# Patient Record
Sex: Male | Born: 1980 | Race: White | Hispanic: No | Marital: Married | Smoking: Former smoker
Health system: Southern US, Community
[De-identification: ages and names within clinical notes are randomized; demographics above are authoritative.]

## PROBLEM LIST (undated history)

## (undated) DIAGNOSIS — E7800 Pure hypercholesterolemia, unspecified: Secondary | ICD-10-CM

## (undated) DIAGNOSIS — G4730 Sleep apnea, unspecified: Secondary | ICD-10-CM

## (undated) DIAGNOSIS — E119 Type 2 diabetes mellitus without complications: Secondary | ICD-10-CM

## (undated) DIAGNOSIS — F329 Major depressive disorder, single episode, unspecified: Secondary | ICD-10-CM

## (undated) DIAGNOSIS — Z720 Tobacco use: Secondary | ICD-10-CM

## (undated) DIAGNOSIS — F32A Depression, unspecified: Secondary | ICD-10-CM

## (undated) DIAGNOSIS — G459 Transient cerebral ischemic attack, unspecified: Secondary | ICD-10-CM

## (undated) DIAGNOSIS — I10 Essential (primary) hypertension: Secondary | ICD-10-CM

## (undated) HISTORY — PX: ABDOMINAL SURGERY: SHX537

## (undated) HISTORY — DX: Sleep apnea, unspecified: G47.30

## (undated) HISTORY — DX: Transient cerebral ischemic attack, unspecified: G45.9

## (undated) HISTORY — PX: CHOLECYSTECTOMY: SHX55

## (undated) HISTORY — PX: APPENDECTOMY: SHX54

---
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
## 2004-05-14 ENCOUNTER — Inpatient Hospital Stay (HOSPITAL_COMMUNITY): Admission: EM | Admit: 2004-05-14 | Discharge: 2004-05-16 | Payer: Self-pay | Admitting: Emergency Medicine

## 2005-07-31 ENCOUNTER — Emergency Department (HOSPITAL_COMMUNITY): Admission: EM | Admit: 2005-07-31 | Discharge: 2005-07-31 | Payer: Self-pay | Admitting: Emergency Medicine

## 2005-10-26 ENCOUNTER — Observation Stay (HOSPITAL_COMMUNITY): Admission: EM | Admit: 2005-10-26 | Discharge: 2005-10-26 | Payer: Self-pay | Admitting: Emergency Medicine

## 2005-10-26 ENCOUNTER — Encounter (INDEPENDENT_AMBULATORY_CARE_PROVIDER_SITE_OTHER): Payer: Self-pay | Admitting: Specialist

## 2006-04-01 ENCOUNTER — Emergency Department (HOSPITAL_COMMUNITY): Admission: EM | Admit: 2006-04-01 | Discharge: 2006-04-01 | Payer: Self-pay | Admitting: Emergency Medicine

## 2007-02-17 ENCOUNTER — Emergency Department (HOSPITAL_COMMUNITY): Admission: EM | Admit: 2007-02-17 | Discharge: 2007-02-17 | Payer: Self-pay | Admitting: Family Medicine

## 2007-06-02 ENCOUNTER — Ambulatory Visit (HOSPITAL_COMMUNITY): Admission: RE | Admit: 2007-06-02 | Discharge: 2007-06-02 | Payer: Self-pay | Admitting: Family Medicine

## 2007-11-28 ENCOUNTER — Ambulatory Visit (HOSPITAL_COMMUNITY): Admission: RE | Admit: 2007-11-28 | Discharge: 2007-11-28 | Payer: Self-pay | Admitting: Family Medicine

## 2008-04-17 ENCOUNTER — Emergency Department (HOSPITAL_COMMUNITY): Admission: EM | Admit: 2008-04-17 | Discharge: 2008-04-17 | Payer: Self-pay | Admitting: Emergency Medicine

## 2008-04-18 ENCOUNTER — Emergency Department (HOSPITAL_COMMUNITY): Admission: EM | Admit: 2008-04-18 | Discharge: 2008-04-18 | Payer: Self-pay | Admitting: Emergency Medicine

## 2008-04-19 ENCOUNTER — Inpatient Hospital Stay (HOSPITAL_COMMUNITY): Admission: AD | Admit: 2008-04-19 | Discharge: 2008-04-21 | Payer: Self-pay | Admitting: Family Medicine

## 2008-04-19 ENCOUNTER — Encounter (HOSPITAL_COMMUNITY): Admission: RE | Admit: 2008-04-19 | Discharge: 2008-05-19 | Payer: Self-pay | Admitting: Emergency Medicine

## 2008-04-20 ENCOUNTER — Encounter (INDEPENDENT_AMBULATORY_CARE_PROVIDER_SITE_OTHER): Payer: Self-pay | Admitting: General Surgery

## 2008-04-23 ENCOUNTER — Inpatient Hospital Stay (HOSPITAL_COMMUNITY): Admission: EM | Admit: 2008-04-23 | Discharge: 2008-04-28 | Payer: Self-pay | Admitting: Emergency Medicine

## 2008-04-30 ENCOUNTER — Inpatient Hospital Stay (HOSPITAL_COMMUNITY): Admission: EM | Admit: 2008-04-30 | Discharge: 2008-05-12 | Payer: Self-pay | Admitting: Emergency Medicine

## 2008-05-31 ENCOUNTER — Encounter: Admission: RE | Admit: 2008-05-31 | Discharge: 2008-05-31 | Payer: Self-pay | Admitting: Gastroenterology

## 2008-06-02 ENCOUNTER — Ambulatory Visit (HOSPITAL_COMMUNITY): Admission: RE | Admit: 2008-06-02 | Discharge: 2008-06-02 | Payer: Self-pay | Admitting: Gastroenterology

## 2008-06-04 ENCOUNTER — Encounter: Admission: RE | Admit: 2008-06-04 | Discharge: 2008-06-04 | Payer: Self-pay | Admitting: Gastroenterology

## 2008-06-07 ENCOUNTER — Ambulatory Visit (HOSPITAL_COMMUNITY): Admission: RE | Admit: 2008-06-07 | Discharge: 2008-06-07 | Payer: Self-pay | Admitting: Gastroenterology

## 2010-01-13 IMAGING — RF DG ERCP WO/W SPHINCTEROTOMY
1 series · 7 of 7 positions shown · non-contrast
Comparison: CT scan dated 04/30/2008

CLINICAL DATA: Abdominal pain after cholecystectomy.  Bile leak.

ERCP
Fluoroscopy Time: 7 minutes 36 seconds

[Series 1: run · 7 of 7 slices shown]
[im 1/7]
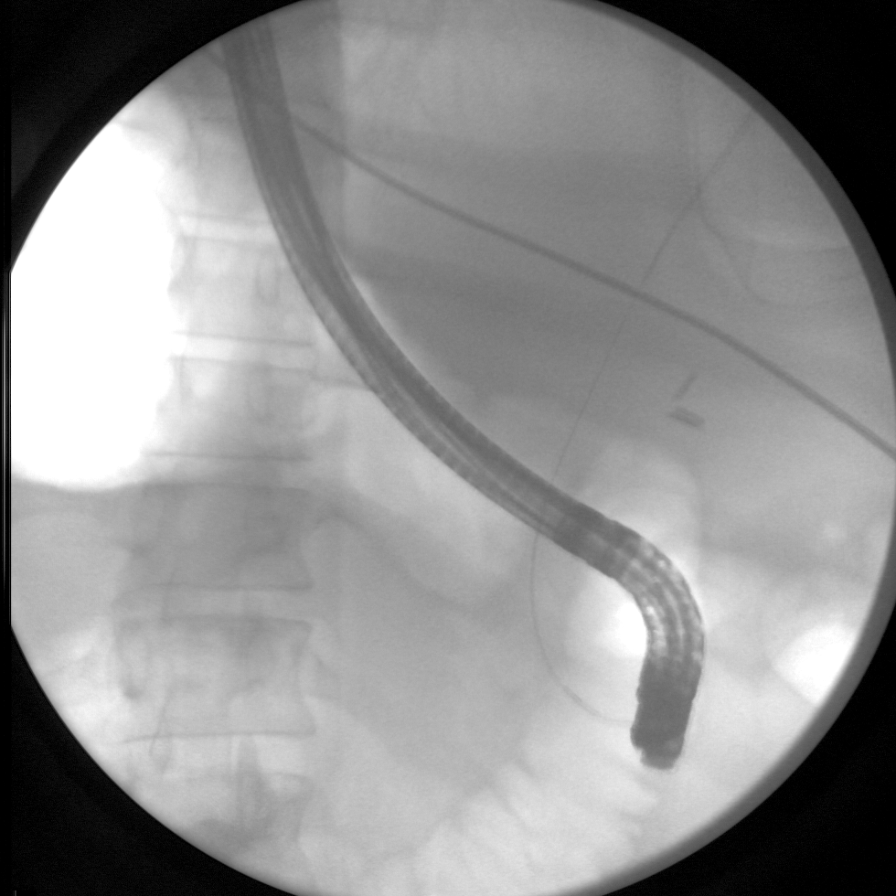
[im 2/7]
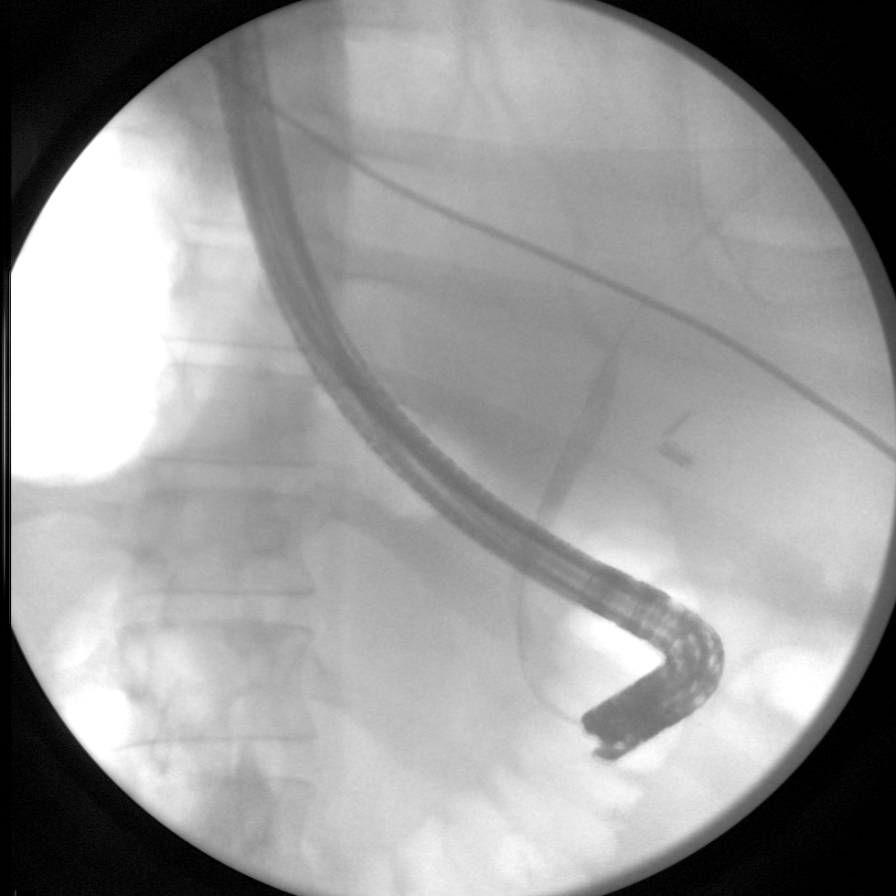
[im 3/7]
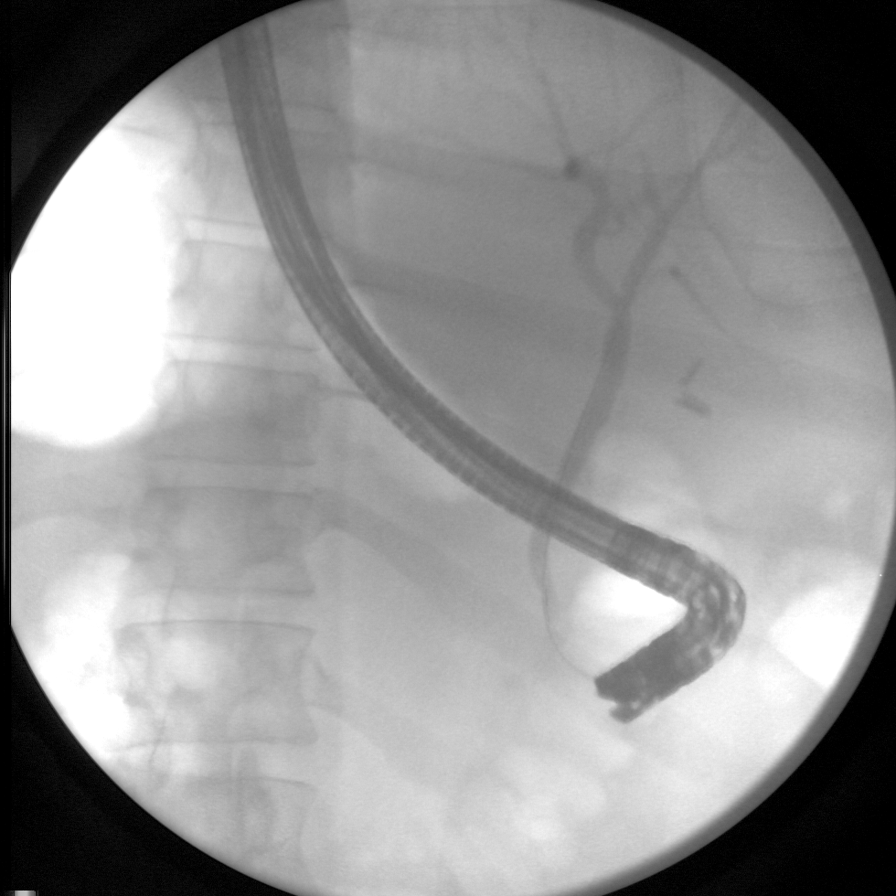
[im 4/7]
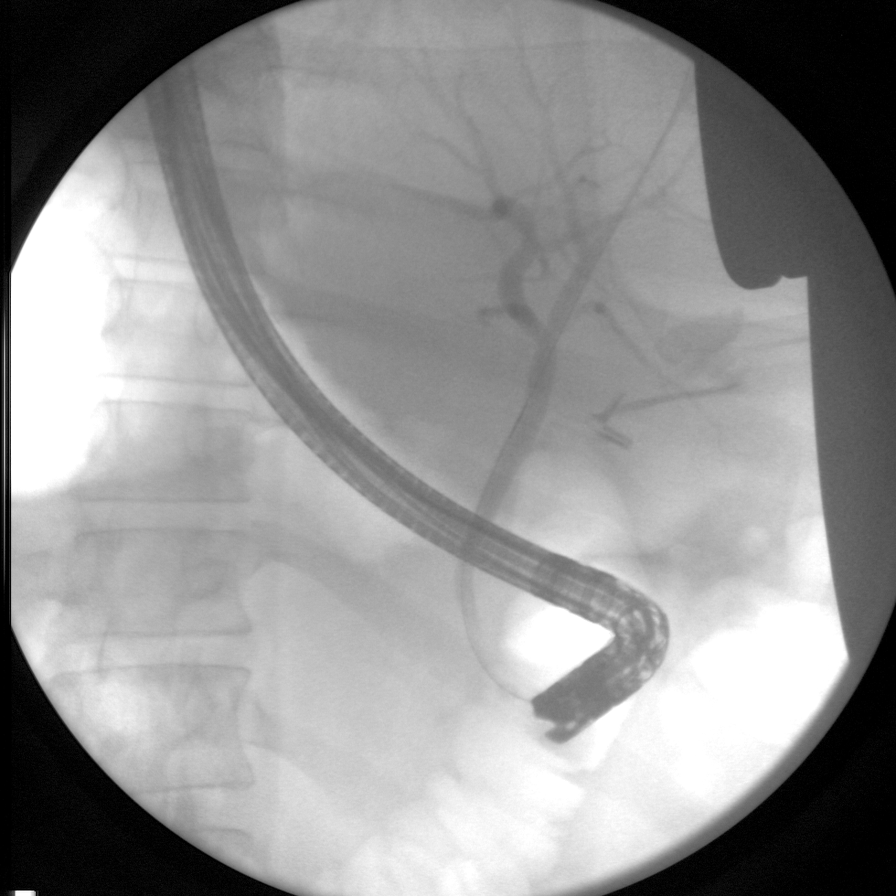
[im 5/7]
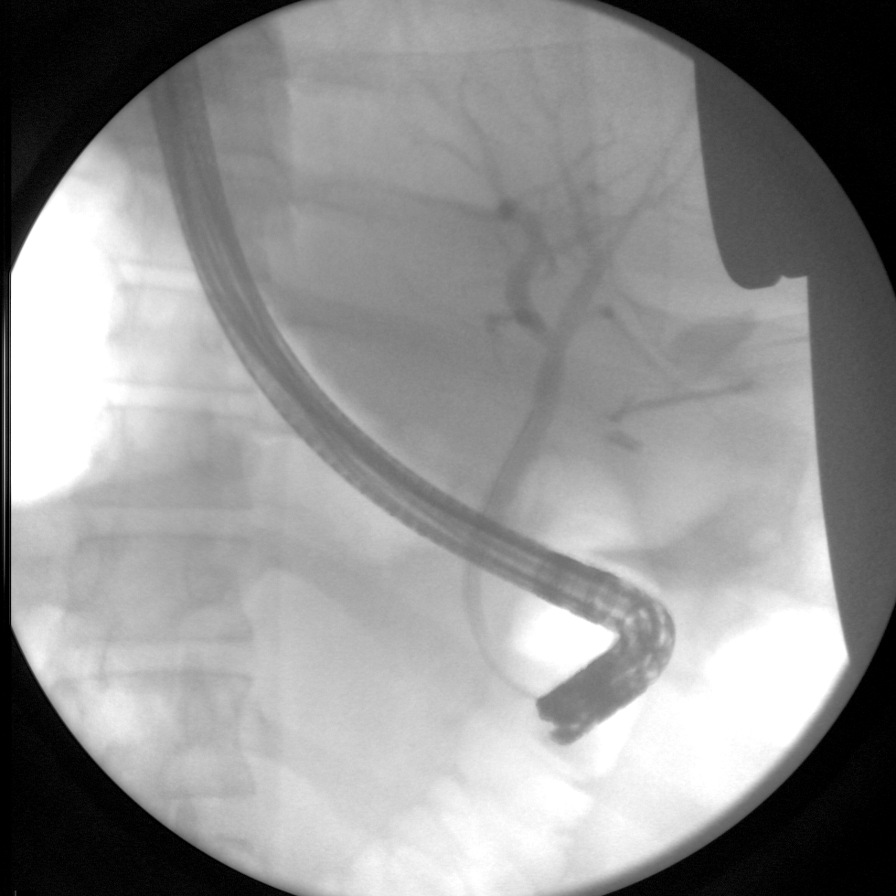
[im 6/7]
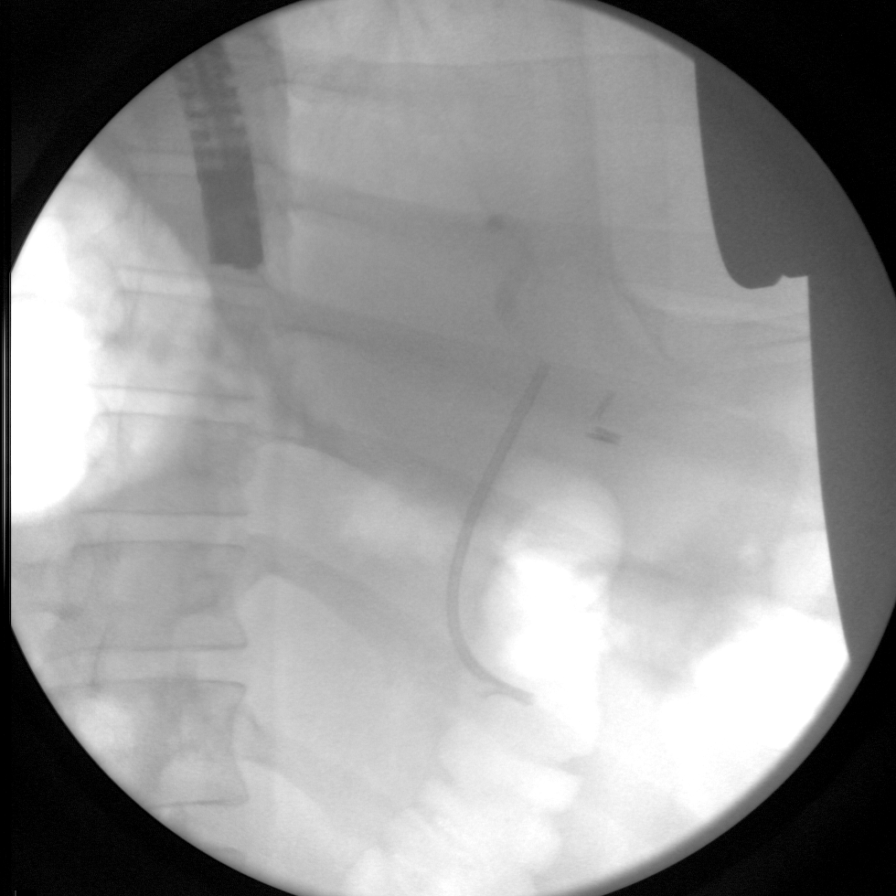
[im 7/7]
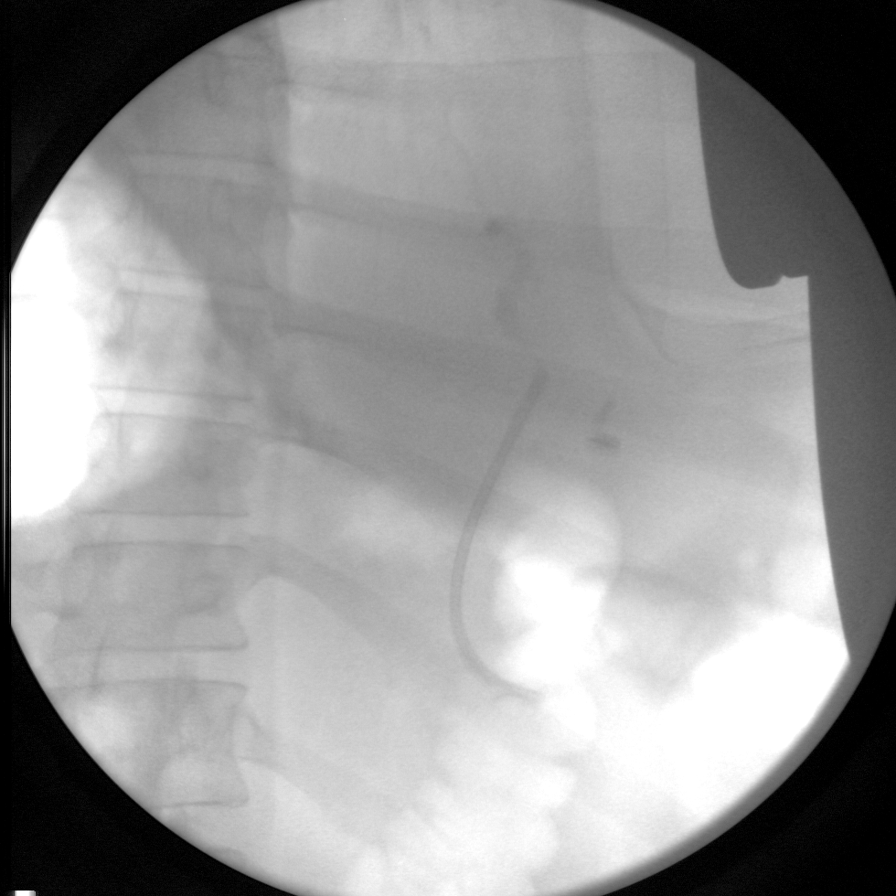

[7 of 7 positions shown; findings below may reference images not displayed]

FINDINGS: Dr. Iqbalshah cannulated the common bile duct.  Injection of
contrast demonstrates a leak of contrast from the biliary tree,
probably the cystic duct stump.  Images demonstrate a stent was
placed in the common bile duct extending into the duodenum.
IMPRESSION: Bile duct leak.  Biliary stent placed.

## 2010-01-13 IMAGING — CT CT PELVIS W/ CM
2 of 5 series · 14 of 32 positions shown, 19 images · IV contrast (agent unspecified)
Comparison: 04/24/2008

CT ABDOMEN

CLINICAL DATA: Post cholecystectomy and pain

CT ABDOMEN AND PELVIS WITH CONTRAST
TECHNIQUE: Multidetector CT imaging of the abdomen and pelvis was
performed using the standard protocol following bolus
administration of intravenous contrast.
Contrast: 100 ml Bmnipaque-BPP

[Series 2: routine abdomen · axial · 0.93mm/px · z∈[-477,-82]mm · 6 of 111 slices shown, 11 images]
[im 16/111  soft-tissue]
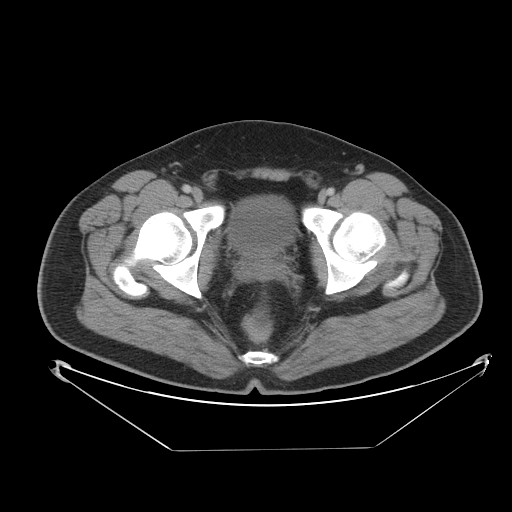
[im 16/111  bone]
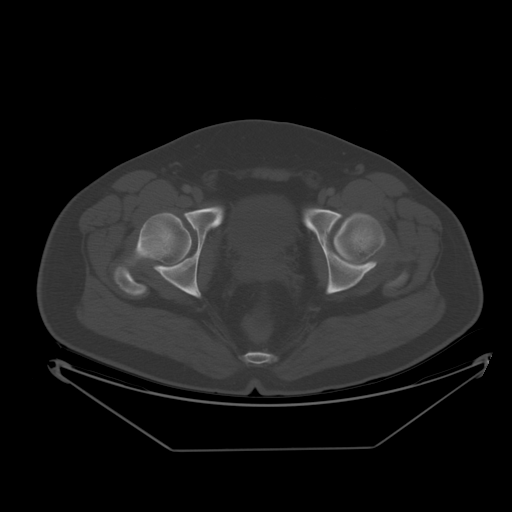
[im 32/111  soft-tissue]
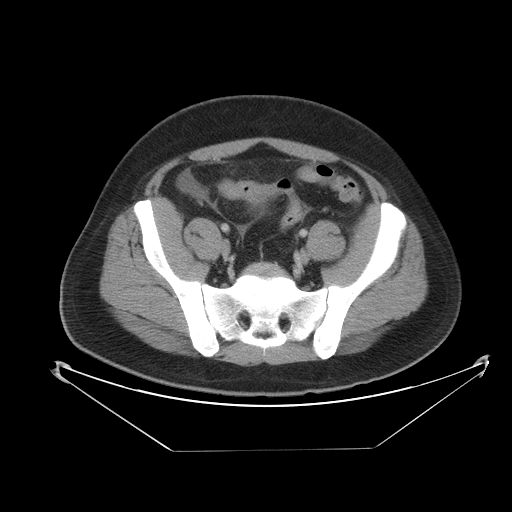
[im 48/111  soft-tissue]
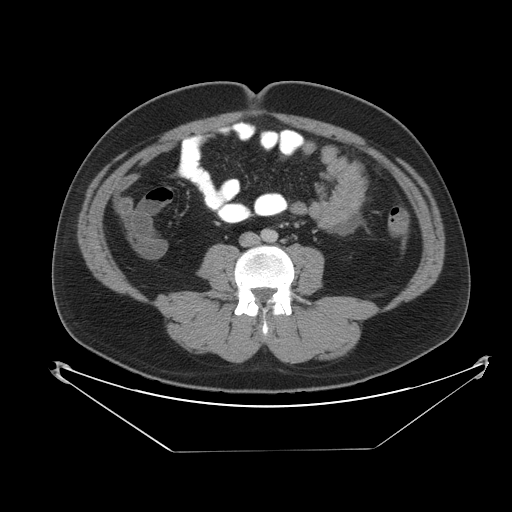
[im 48/111  lung]
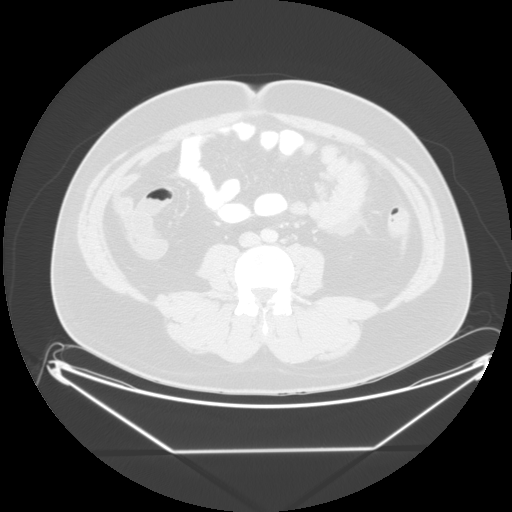
[im 63/111  soft-tissue]
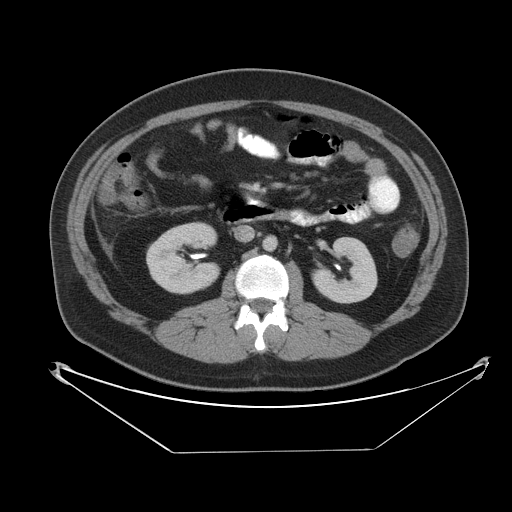
[im 63/111  lung]
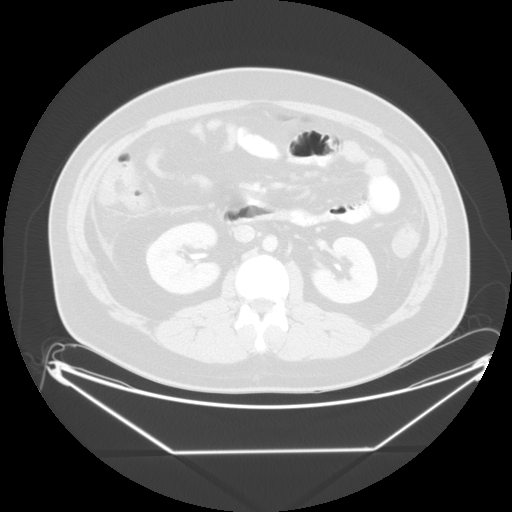
[im 79/111  soft-tissue]
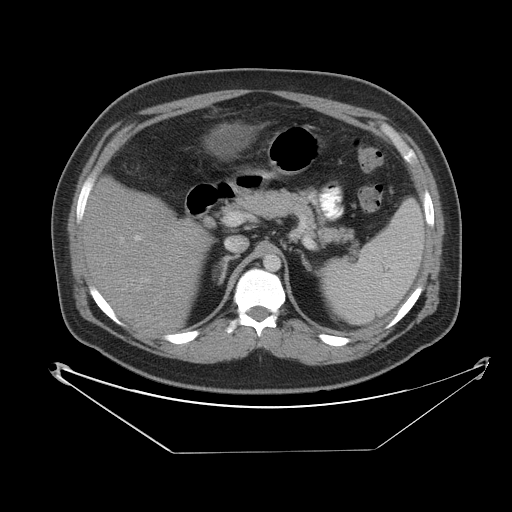
[im 79/111  lung]
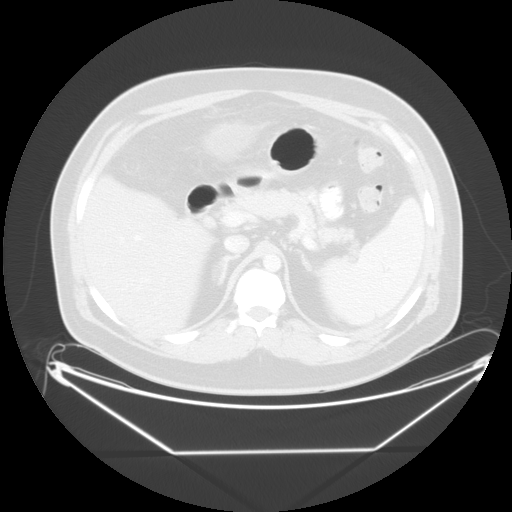
[im 95/111  soft-tissue]
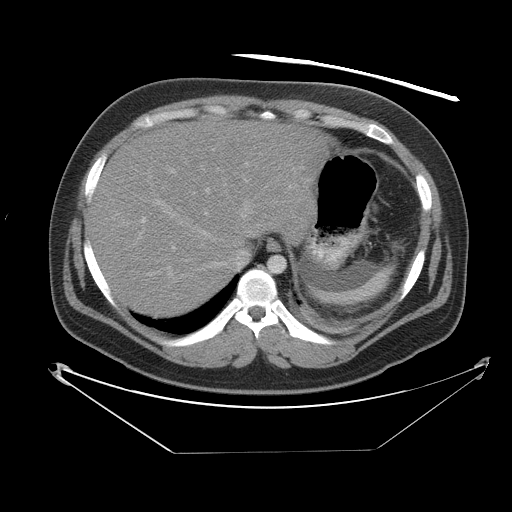
[im 95/111  lung]
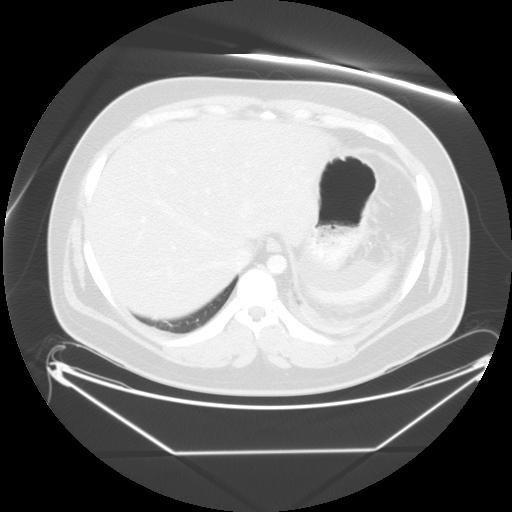

[Series 400: reformatted · sagittal · 1.10mm/px · 8 of 126 slices shown]
[im 13/126  soft-tissue]
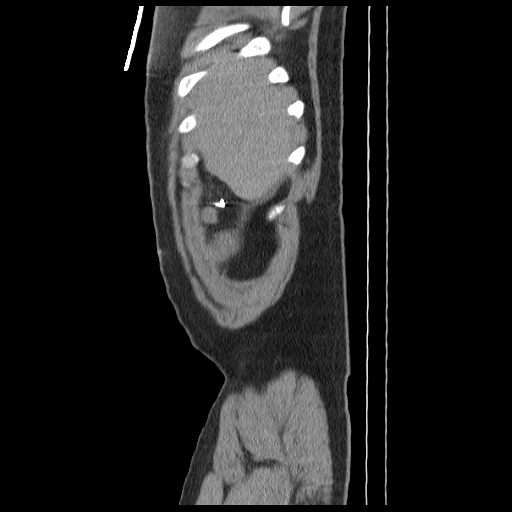
[im 26/126  soft-tissue]
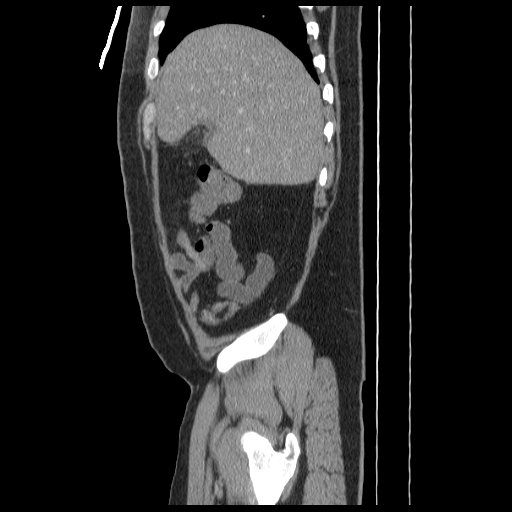
[im 38/126  soft-tissue]
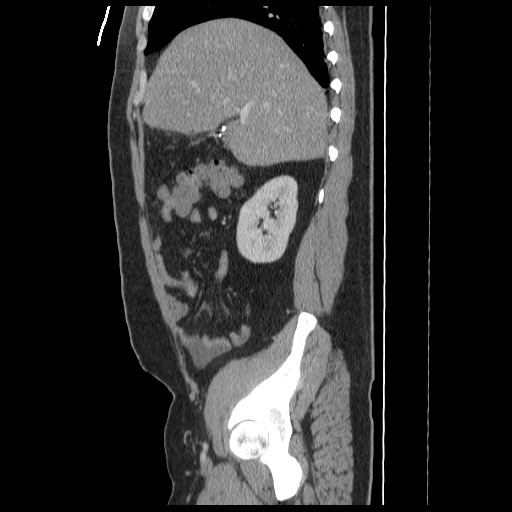
[im 51/126  soft-tissue]
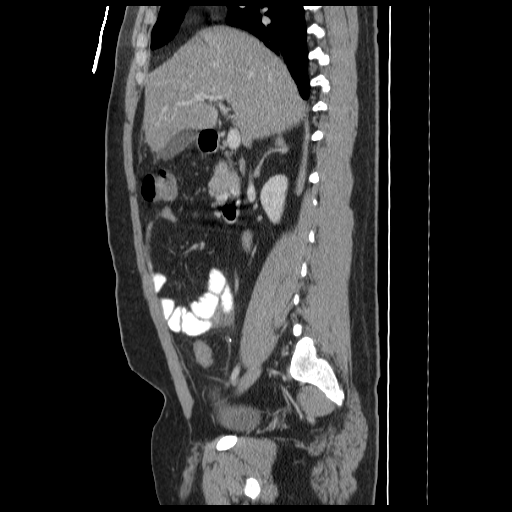
[im 76/126  soft-tissue]
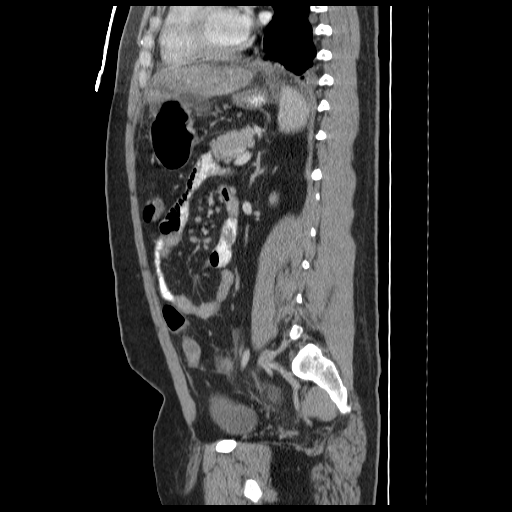
[im 88/126  soft-tissue]
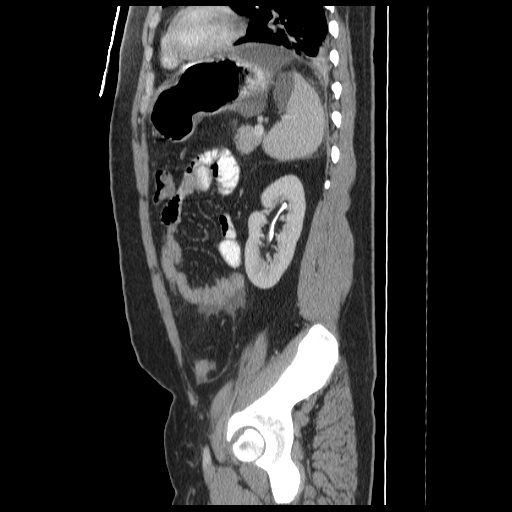
[im 101/126  soft-tissue]
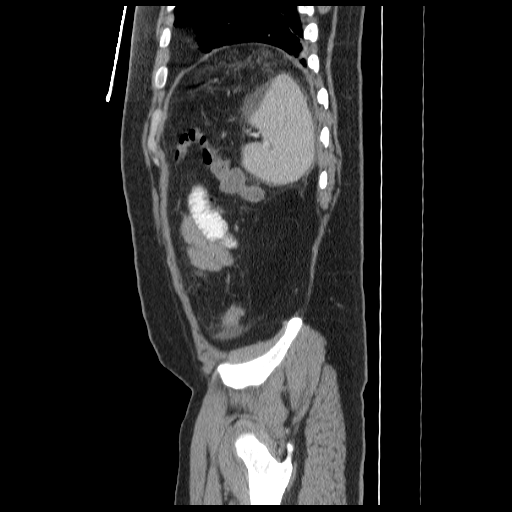
[im 113/126  soft-tissue]
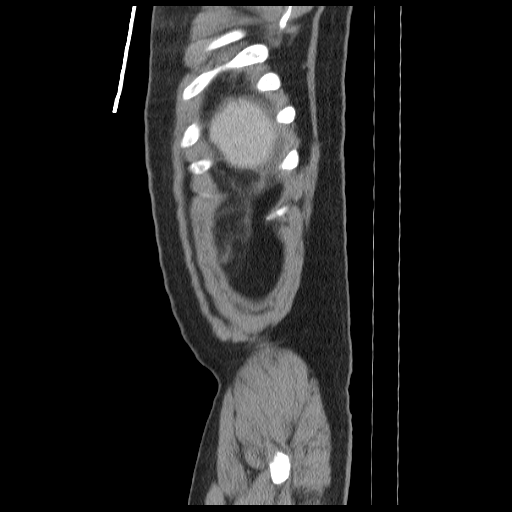

[14 of 32 positions shown; findings below may reference images not displayed]

FINDINGS: Left basilar airspace opacities.  A 83.8 x 6.4 cm fluid
collection is seen adjacent to the left lobe of the liver
posteriorly extending towards the gallbladder fossa.  No fluid
collection in the gallbladder fossa is present other than a sliver
of fluid posteriorly.  A small amount of free fluid is seen about
the liver inferiorly and surrounding the spleen.  There is no free
intraperitoneal gas.  Stranding in the peritoneal fat is present of
the right upper quadrant worrisome for inflammatory change or
postoperative change.  The fluid collection adjacent to the tail of
the pancreas has improved however there is now more fluid slightly
superiorly adjacent to the greater curvature of the stomach.

Pancreas, kidneys, adrenal glands are stable.
IMPRESSION: Increased free fluid in the abdomen.  Specifically, there is
increased fluid adjacent to the posterior left lobe of the liver,
around the spleen, and tip of the liver.  These findings are highly
worrisome for bile leak.

CT PELVIS
FINDINGS: Free fluid is seen within the right lower quadrant of
the abdomen and dependent portion of the pelvis.  Bladder is
unremarkable.  Prostate is within normal limits.  No free
intraperitoneal gas.  Minimal fluid in the left paracolic gutter.
IMPRESSION: Free fluid.

## 2010-04-30 ENCOUNTER — Encounter: Payer: Self-pay | Admitting: Gastroenterology

## 2010-07-24 LAB — CBC
HCT: 39 % (ref 39.0–52.0)
HCT: 42.8 % (ref 39.0–52.0)
HCT: 44.4 % (ref 39.0–52.0)
HCT: 42.3 % (ref 39.0–52.0)
HCT: 35.2 % — ABNORMAL LOW (ref 39.0–52.0)
HCT: 35.6 % — ABNORMAL LOW (ref 39.0–52.0)
HCT: 35.9 % — ABNORMAL LOW (ref 39.0–52.0)
HCT: 36.2 % — ABNORMAL LOW (ref 39.0–52.0)
HCT: 40.3 % (ref 39.0–52.0)
HCT: 41.1 % (ref 39.0–52.0)
HCT: 43.2 % (ref 39.0–52.0)
Hemoglobin: 13.6 g/dL (ref 13.0–17.0)
Hemoglobin: 15.1 g/dL (ref 13.0–17.0)
Hemoglobin: 15.8 g/dL (ref 13.0–17.0)
Hemoglobin: 11.4 g/dL — ABNORMAL LOW (ref 13.0–17.0)
Hemoglobin: 11.8 g/dL — ABNORMAL LOW (ref 13.0–17.0)
Hemoglobin: 13.1 g/dL (ref 13.0–17.0)
Hemoglobin: 13.7 g/dL (ref 13.0–17.0)
Hemoglobin: 14.4 g/dL (ref 13.0–17.0)
MCHC: 34.8 g/dL (ref 30.0–36.0)
MCHC: 33.8 g/dL (ref 30.0–36.0)
MCHC: 33.8 g/dL (ref 30.0–36.0)
MCHC: 33.2 g/dL (ref 30.0–36.0)
MCHC: 33.4 g/dL (ref 30.0–36.0)
MCHC: 33.7 g/dL (ref 30.0–36.0)
MCHC: 34.5 g/dL (ref 30.0–36.0)
MCV: 89.6 fL (ref 78.0–100.0)
MCV: 90.2 fL (ref 78.0–100.0)
MCV: 90.2 fL (ref 78.0–100.0)
MCV: 90.3 fL (ref 78.0–100.0)
MCV: 90.4 fL (ref 78.0–100.0)
MCV: 90.5 fL (ref 78.0–100.0)
MCV: 89.3 fL (ref 78.0–100.0)
MCV: 89.5 fL (ref 78.0–100.0)
MCV: 89.8 fL (ref 78.0–100.0)
MCV: 89.9 fL (ref 78.0–100.0)
MCV: 90.1 fL (ref 78.0–100.0)
Platelets: 211 10*3/uL (ref 150–400)
Platelets: 215 10*3/uL (ref 150–400)
Platelets: 295 10*3/uL (ref 150–400)
Platelets: 310 10*3/uL (ref 150–400)
Platelets: 321 10*3/uL (ref 150–400)
Platelets: 370 10*3/uL (ref 150–400)
Platelets: 445 10*3/uL — ABNORMAL HIGH (ref 150–400)
Platelets: 486 10*3/uL — ABNORMAL HIGH (ref 150–400)
Platelets: 556 10*3/uL — ABNORMAL HIGH (ref 150–400)
Platelets: 618 10*3/uL — ABNORMAL HIGH (ref 150–400)
Platelets: 652 10*3/uL — ABNORMAL HIGH (ref 150–400)
RBC: 4.35 MIL/uL (ref 4.22–5.81)
RBC: 5.14 MIL/uL (ref 4.22–5.81)
RBC: 4.55 MIL/uL (ref 4.22–5.81)
RBC: 4.69 MIL/uL (ref 4.22–5.81)
RBC: 3.7 MIL/uL — ABNORMAL LOW (ref 4.22–5.81)
RBC: 4.24 MIL/uL (ref 4.22–5.81)
RBC: 4.47 MIL/uL (ref 4.22–5.81)
RBC: 4.81 MIL/uL (ref 4.22–5.81)
RDW: 13.5 % (ref 11.5–15.5)
RDW: 13.6 % (ref 11.5–15.5)
RDW: 13 % (ref 11.5–15.5)
RDW: 13.5 % (ref 11.5–15.5)
RDW: 13.6 % (ref 11.5–15.5)
RDW: 13.7 % (ref 11.5–15.5)
RDW: 13.7 % (ref 11.5–15.5)
RDW: 13.8 % (ref 11.5–15.5)
RDW: 13.8 % (ref 11.5–15.5)
RDW: 13.9 % (ref 11.5–15.5)
RDW: 14.2 % (ref 11.5–15.5)
RDW: 14.2 % (ref 11.5–15.5)
RDW: 14.4 % (ref 11.5–15.5)
RDW: 14.6 % (ref 11.5–15.5)
WBC: 13.8 10*3/uL — ABNORMAL HIGH (ref 4.0–10.5)
WBC: 14 10*3/uL — ABNORMAL HIGH (ref 4.0–10.5)
WBC: 11.9 10*3/uL — ABNORMAL HIGH (ref 4.0–10.5)
WBC: 13.4 10*3/uL — ABNORMAL HIGH (ref 4.0–10.5)
WBC: 15.6 10*3/uL — ABNORMAL HIGH (ref 4.0–10.5)
WBC: 15 10*3/uL — ABNORMAL HIGH (ref 4.0–10.5)
WBC: 15.8 10*3/uL — ABNORMAL HIGH (ref 4.0–10.5)
WBC: 16.9 10*3/uL — ABNORMAL HIGH (ref 4.0–10.5)
WBC: 18.1 10*3/uL — ABNORMAL HIGH (ref 4.0–10.5)
WBC: 36.4 10*3/uL — ABNORMAL HIGH (ref 4.0–10.5)

## 2010-07-24 LAB — DIFFERENTIAL
Band Neutrophils: 1 % (ref 0–10)
Basophils Absolute: 0 10*3/uL (ref 0.0–0.1)
Basophils Absolute: 0 10*3/uL (ref 0.0–0.1)
Basophils Absolute: 0 10*3/uL (ref 0.0–0.1)
Basophils Absolute: 0 10*3/uL (ref 0.0–0.1)
Basophils Absolute: 0 10*3/uL (ref 0.0–0.1)
Basophils Absolute: 0 10*3/uL (ref 0.0–0.1)
Basophils Absolute: 0.1 10*3/uL (ref 0.0–0.1)
Basophils Absolute: 0.2 10*3/uL — ABNORMAL HIGH (ref 0.0–0.1)
Basophils Relative: 0 % (ref 0–1)
Basophils Relative: 0 % (ref 0–1)
Basophils Relative: 0 % (ref 0–1)
Basophils Relative: 0 % (ref 0–1)
Basophils Relative: 0 % (ref 0–1)
Basophils Relative: 0 % (ref 0–1)
Basophils Relative: 0 % (ref 0–1)
Basophils Relative: 1 % (ref 0–1)
Blasts: 0 %
Eosinophils Absolute: 0.1 10*3/uL (ref 0.0–0.7)
Eosinophils Absolute: 0 10*3/uL (ref 0.0–0.7)
Eosinophils Absolute: 0 10*3/uL (ref 0.0–0.7)
Eosinophils Absolute: 0.1 10*3/uL (ref 0.0–0.7)
Eosinophils Absolute: 0.1 10*3/uL (ref 0.0–0.7)
Eosinophils Absolute: 0.2 10*3/uL (ref 0.0–0.7)
Eosinophils Absolute: 0 10*3/uL (ref 0.0–0.7)
Eosinophils Absolute: 0.3 10*3/uL (ref 0.0–0.7)
Eosinophils Relative: 0 % (ref 0–5)
Eosinophils Relative: 0 % (ref 0–5)
Eosinophils Relative: 0 % (ref 0–5)
Eosinophils Relative: 0 % (ref 0–5)
Eosinophils Relative: 1 % (ref 0–5)
Lymphocytes Relative: 11 % — ABNORMAL LOW (ref 12–46)
Lymphocytes Relative: 25 % (ref 12–46)
Lymphocytes Relative: 33 % (ref 12–46)
Lymphocytes Relative: 3 % — ABNORMAL LOW (ref 12–46)
Lymphocytes Relative: 6 % — ABNORMAL LOW (ref 12–46)
Lymphs Abs: 0.4 10*3/uL — ABNORMAL LOW (ref 0.7–4.0)
Lymphs Abs: 4.6 10*3/uL — ABNORMAL HIGH (ref 0.7–4.0)
Lymphs Abs: 0.9 10*3/uL (ref 0.7–4.0)
Lymphs Abs: 1.1 10*3/uL (ref 0.7–4.0)
Lymphs Abs: 1.2 10*3/uL (ref 0.7–4.0)
Monocytes Absolute: 0.7 10*3/uL (ref 0.1–1.0)
Monocytes Absolute: 0.7 10*3/uL (ref 0.1–1.0)
Monocytes Absolute: 1.7 10*3/uL — ABNORMAL HIGH (ref 0.1–1.0)
Monocytes Absolute: 1.8 10*3/uL — ABNORMAL HIGH (ref 0.1–1.0)
Monocytes Relative: 11 % (ref 3–12)
Monocytes Relative: 7 % (ref 3–12)
Monocytes Relative: 10 % (ref 3–12)
Monocytes Relative: 12 % (ref 3–12)
Monocytes Relative: 6 % (ref 3–12)
Myelocytes: 0 %
Neutro Abs: 3.8 10*3/uL (ref 1.7–7.7)
Neutro Abs: 3.9 10*3/uL (ref 1.7–7.7)
Neutro Abs: 7.4 10*3/uL (ref 1.7–7.7)
Neutro Abs: 10.8 10*3/uL — ABNORMAL HIGH (ref 1.7–7.7)
Neutro Abs: 11.4 10*3/uL — ABNORMAL HIGH (ref 1.7–7.7)
Neutro Abs: 11.8 10*3/uL — ABNORMAL HIGH (ref 1.7–7.7)
Neutro Abs: 15.7 10*3/uL — ABNORMAL HIGH (ref 1.7–7.7)
Neutrophils Relative %: 57 % (ref 43–77)
Neutrophils Relative %: 60 % (ref 43–77)
Neutrophils Relative %: 93 % — ABNORMAL HIGH (ref 43–77)
Neutrophils Relative %: 78 % — ABNORMAL HIGH (ref 43–77)
Neutrophils Relative %: 79 % — ABNORMAL HIGH (ref 43–77)
Neutrophils Relative %: 80 % — ABNORMAL HIGH (ref 43–77)
Neutrophils Relative %: 82 % — ABNORMAL HIGH (ref 43–77)
Neutrophils Relative %: 79 % — ABNORMAL HIGH (ref 43–77)
Neutrophils Relative %: 92 % — ABNORMAL HIGH (ref 43–77)
Promyelocytes Absolute: 0 %

## 2010-07-24 LAB — COMPREHENSIVE METABOLIC PANEL
ALT: 107 U/L — ABNORMAL HIGH (ref 0–53)
ALT: 58 U/L — ABNORMAL HIGH (ref 0–53)
ALT: 144 U/L — ABNORMAL HIGH (ref 0–53)
ALT: 211 U/L — ABNORMAL HIGH (ref 0–53)
ALT: 23 U/L (ref 0–53)
ALT: 28 U/L (ref 0–53)
ALT: 43 U/L (ref 0–53)
ALT: 47 U/L (ref 0–53)
ALT: 50 U/L (ref 0–53)
ALT: 61 U/L — ABNORMAL HIGH (ref 0–53)
AST: 23 U/L (ref 0–37)
AST: 30 U/L (ref 0–37)
AST: 23 U/L (ref 0–37)
AST: 26 U/L (ref 0–37)
AST: 36 U/L (ref 0–37)
AST: 48 U/L — ABNORMAL HIGH (ref 0–37)
AST: 81 U/L — ABNORMAL HIGH (ref 0–37)
Albumin: 3.8 g/dL (ref 3.5–5.2)
Albumin: 3.9 g/dL (ref 3.5–5.2)
Albumin: 2 g/dL — ABNORMAL LOW (ref 3.5–5.2)
Albumin: 2.2 g/dL — ABNORMAL LOW (ref 3.5–5.2)
Albumin: 2.3 g/dL — ABNORMAL LOW (ref 3.5–5.2)
Albumin: 2.4 g/dL — ABNORMAL LOW (ref 3.5–5.2)
Alkaline Phosphatase: 37 U/L — ABNORMAL LOW (ref 39–117)
Alkaline Phosphatase: 45 U/L (ref 39–117)
Alkaline Phosphatase: 53 U/L (ref 39–117)
Alkaline Phosphatase: 56 U/L (ref 39–117)
Alkaline Phosphatase: 64 U/L (ref 39–117)
Alkaline Phosphatase: 76 U/L (ref 39–117)
Alkaline Phosphatase: 94 U/L (ref 39–117)
BUN: 10 mg/dL (ref 6–23)
BUN: 11 mg/dL (ref 6–23)
BUN: 5 mg/dL — ABNORMAL LOW (ref 6–23)
BUN: 6 mg/dL (ref 6–23)
BUN: 7 mg/dL (ref 6–23)
BUN: 8 mg/dL (ref 6–23)
BUN: 9 mg/dL (ref 6–23)
CO2: 25 mEq/L (ref 19–32)
CO2: 22 mEq/L (ref 19–32)
CO2: 23 mEq/L (ref 19–32)
CO2: 23 mEq/L (ref 19–32)
CO2: 23 mEq/L (ref 19–32)
CO2: 24 mEq/L (ref 19–32)
CO2: 25 mEq/L (ref 19–32)
CO2: 26 mEq/L (ref 19–32)
Calcium: 9.6 mg/dL (ref 8.4–10.5)
Calcium: 7.7 mg/dL — ABNORMAL LOW (ref 8.4–10.5)
Calcium: 8.2 mg/dL — ABNORMAL LOW (ref 8.4–10.5)
Calcium: 8.5 mg/dL (ref 8.4–10.5)
Calcium: 8.7 mg/dL (ref 8.4–10.5)
Chloride: 101 mEq/L (ref 96–112)
Chloride: 104 mEq/L (ref 96–112)
Chloride: 104 mEq/L (ref 96–112)
Chloride: 105 mEq/L (ref 96–112)
Chloride: 105 mEq/L (ref 96–112)
Chloride: 106 mEq/L (ref 96–112)
Chloride: 106 mEq/L (ref 96–112)
Chloride: 107 mEq/L (ref 96–112)
Creatinine, Ser: 1.1 mg/dL (ref 0.4–1.5)
Creatinine, Ser: 0.79 mg/dL (ref 0.4–1.5)
Creatinine, Ser: 0.81 mg/dL (ref 0.4–1.5)
Creatinine, Ser: 0.83 mg/dL (ref 0.4–1.5)
Creatinine, Ser: 0.86 mg/dL (ref 0.4–1.5)
GFR calc Af Amer: >60 mL/min (ref >60)
GFR calc Af Amer: >60 mL/min (ref >60)
GFR calc Af Amer: >60 mL/min (ref >60)
GFR calc Af Amer: >60 mL/min (ref >60)
GFR calc Af Amer: >60 mL/min (ref >60)
GFR calc Af Amer: >60 mL/min (ref >60)
GFR calc Af Amer: >60 mL/min (ref >60)
GFR calc non Af Amer: >60 mL/min (ref >60)
GFR calc non Af Amer: >60 mL/min (ref >60)
GFR calc non Af Amer: >60 mL/min (ref >60)
GFR calc non Af Amer: >60 mL/min (ref >60)
GFR calc non Af Amer: >60 mL/min (ref >60)
GFR calc non Af Amer: >60 mL/min (ref >60)
Glucose, Bld: 121 mg/dL — ABNORMAL HIGH (ref 70–99)
Glucose, Bld: 123 mg/dL — ABNORMAL HIGH (ref 70–99)
Glucose, Bld: 103 mg/dL — ABNORMAL HIGH (ref 70–99)
Glucose, Bld: 157 mg/dL — ABNORMAL HIGH (ref 70–99)
Glucose, Bld: 77 mg/dL (ref 70–99)
Glucose, Bld: 98 mg/dL (ref 70–99)
Potassium: 3.1 mEq/L — ABNORMAL LOW (ref 3.5–5.1)
Potassium: 3.5 mEq/L (ref 3.5–5.1)
Potassium: 4.2 mEq/L (ref 3.5–5.1)
Potassium: 3.5 mEq/L (ref 3.5–5.1)
Potassium: 3.9 mEq/L (ref 3.5–5.1)
Potassium: 3.9 mEq/L (ref 3.5–5.1)
Potassium: 4.1 mEq/L (ref 3.5–5.1)
Potassium: 4.2 mEq/L (ref 3.5–5.1)
Sodium: 141 mEq/L (ref 135–145)
Sodium: 135 mEq/L (ref 135–145)
Sodium: 136 mEq/L (ref 135–145)
Sodium: 137 mEq/L (ref 135–145)
Sodium: 138 mEq/L (ref 135–145)
Sodium: 139 mEq/L (ref 135–145)
Sodium: 139 mEq/L (ref 135–145)
Sodium: 141 mEq/L (ref 135–145)
Total Bilirubin: 0.5 mg/dL (ref 0.3–1.2)
Total Bilirubin: 0.9 mg/dL (ref 0.3–1.2)
Total Bilirubin: 0.6 mg/dL (ref 0.3–1.2)
Total Bilirubin: 0.9 mg/dL (ref 0.3–1.2)
Total Bilirubin: 1 mg/dL (ref 0.3–1.2)
Total Bilirubin: 1 mg/dL (ref 0.3–1.2)
Total Bilirubin: 1.2 mg/dL (ref 0.3–1.2)
Total Bilirubin: 1.2 mg/dL (ref 0.3–1.2)
Total Bilirubin: 1.3 mg/dL — ABNORMAL HIGH (ref 0.3–1.2)
Total Protein: 6.9 g/dL (ref 6.0–8.3)
Total Protein: 5 g/dL — ABNORMAL LOW (ref 6.0–8.3)
Total Protein: 5.2 g/dL — ABNORMAL LOW (ref 6.0–8.3)
Total Protein: 5.4 g/dL — ABNORMAL LOW (ref 6.0–8.3)
Total Protein: 6.1 g/dL (ref 6.0–8.3)
Total Protein: 6.1 g/dL (ref 6.0–8.3)
Total Protein: 6.3 g/dL (ref 6.0–8.3)

## 2010-07-24 LAB — URINALYSIS, ROUTINE W REFLEX MICROSCOPIC
Glucose, UA: NEGATIVE mg/dL
Hgb urine dipstick: NEGATIVE
Hgb urine dipstick: NEGATIVE
Nitrite: NEGATIVE
Specific Gravity, Urine: 1.01 (ref 1.005–1.030)
pH: 7 (ref 5.0–8.0)

## 2010-07-24 LAB — BASIC METABOLIC PANEL
BUN: 7 mg/dL (ref 6–23)
BUN: 5 mg/dL — ABNORMAL LOW (ref 6–23)
BUN: 6 mg/dL (ref 6–23)
BUN: 8 mg/dL (ref 6–23)
CO2: 27 mEq/L (ref 19–32)
CO2: 25 mEq/L (ref 19–32)
CO2: 26 mEq/L (ref 19–32)
Calcium: 8.4 mg/dL (ref 8.4–10.5)
Calcium: 8.6 mg/dL (ref 8.4–10.5)
Calcium: 8.4 mg/dL (ref 8.4–10.5)
Calcium: 8.6 mg/dL (ref 8.4–10.5)
Chloride: 107 mEq/L (ref 96–112)
Chloride: 100 mEq/L (ref 96–112)
Chloride: 101 mEq/L (ref 96–112)
Chloride: 104 mEq/L (ref 96–112)
Chloride: 108 mEq/L (ref 96–112)
Chloride: 99 mEq/L (ref 96–112)
Creatinine, Ser: 0.95 mg/dL (ref 0.4–1.5)
Creatinine, Ser: 0.99 mg/dL (ref 0.4–1.5)
Creatinine, Ser: 1 mg/dL (ref 0.4–1.5)
Creatinine, Ser: 1.05 mg/dL (ref 0.4–1.5)
GFR calc Af Amer: >60 mL/min (ref >60)
GFR calc Af Amer: >60 mL/min (ref >60)
GFR calc Af Amer: >60 mL/min (ref >60)
GFR calc Af Amer: >60 mL/min (ref >60)
GFR calc non Af Amer: >60 mL/min (ref >60)
GFR calc non Af Amer: >60 mL/min (ref >60)
GFR calc non Af Amer: >60 mL/min (ref >60)
Glucose, Bld: 104 mg/dL — ABNORMAL HIGH (ref 70–99)
Glucose, Bld: 109 mg/dL — ABNORMAL HIGH (ref 70–99)
Glucose, Bld: 127 mg/dL — ABNORMAL HIGH (ref 70–99)
Glucose, Bld: 134 mg/dL — ABNORMAL HIGH (ref 70–99)
Potassium: 3.6 mEq/L (ref 3.5–5.1)
Potassium: 4.2 mEq/L (ref 3.5–5.1)
Potassium: 4.5 mEq/L (ref 3.5–5.1)
Sodium: 138 mEq/L (ref 135–145)
Sodium: 140 mEq/L (ref 135–145)
Sodium: 136 mEq/L (ref 135–145)
Sodium: 132 mEq/L — ABNORMAL LOW (ref 135–145)

## 2010-07-24 LAB — MAGNESIUM: Magnesium: 2.1 mg/dL (ref 1.5–2.5)

## 2010-07-24 LAB — HEPATITIS PANEL, ACUTE
Hep B C IgM: NEGATIVE
Hepatitis B Surface Ag: NEGATIVE

## 2010-07-24 LAB — LIPASE, BLOOD
Lipase: 25 U/L (ref 11–59)
Lipase: 61 U/L — ABNORMAL HIGH (ref 11–59)
Lipase: 67 U/L — ABNORMAL HIGH (ref 11–59)
Lipase: 71 U/L — ABNORMAL HIGH (ref 11–59)
Lipase: 99 U/L — ABNORMAL HIGH (ref 11–59)

## 2010-07-24 LAB — HEPATIC FUNCTION PANEL
ALT: 101 U/L — ABNORMAL HIGH (ref 0–53)
AST: 26 U/L (ref 0–37)
AST: 37 U/L (ref 0–37)
AST: 46 U/L — ABNORMAL HIGH (ref 0–37)
Albumin: 2.6 g/dL — ABNORMAL LOW (ref 3.5–5.2)
Albumin: 2.7 g/dL — ABNORMAL LOW (ref 3.5–5.2)
Albumin: 2.9 g/dL — ABNORMAL LOW (ref 3.5–5.2)
Albumin: 3.3 g/dL — ABNORMAL LOW (ref 3.5–5.2)
Albumin: 3.5 g/dL (ref 3.5–5.2)
Alkaline Phosphatase: 38 U/L — ABNORMAL LOW (ref 39–117)
Alkaline Phosphatase: 50 U/L (ref 39–117)
Bilirubin, Direct: 0.2 mg/dL (ref 0.0–0.3)
Bilirubin, Direct: 1 mg/dL — ABNORMAL HIGH (ref 0.0–0.3)
Indirect Bilirubin: 0.6 mg/dL (ref 0.3–0.9)
Indirect Bilirubin: 0.9 mg/dL (ref 0.3–0.9)
Total Bilirubin: 0.9 mg/dL (ref 0.3–1.2)
Total Bilirubin: 1.2 mg/dL (ref 0.3–1.2)
Total Bilirubin: 1.5 mg/dL — ABNORMAL HIGH (ref 0.3–1.2)
Total Bilirubin: 1.8 mg/dL — ABNORMAL HIGH (ref 0.3–1.2)
Total Protein: 5.9 g/dL — ABNORMAL LOW (ref 6.0–8.3)
Total Protein: 5.9 g/dL — ABNORMAL LOW (ref 6.0–8.3)
Total Protein: 6.1 g/dL (ref 6.0–8.3)

## 2010-07-24 LAB — URINE MICROSCOPIC-ADD ON

## 2010-07-24 LAB — PHOSPHORUS: Phosphorus: 3.3 mg/dL (ref 2.3–4.6)

## 2010-07-24 LAB — AMYLASE
Amylase: 43 U/L (ref 27–131)
Amylase: 78 U/L (ref 27–131)

## 2010-07-24 LAB — CLOSTRIDIUM DIFFICILE EIA

## 2010-07-24 LAB — CULTURE, BLOOD (ROUTINE X 2)
Culture: NO GROWTH
Culture: NO GROWTH

## 2010-07-25 LAB — CULTURE, ROUTINE-ABSCESS

## 2010-07-25 LAB — DIFFERENTIAL
Basophils Relative: 0 % (ref 0–1)
Eosinophils Relative: 3 % (ref 0–5)
Monocytes Absolute: 1 10*3/uL (ref 0.1–1.0)
Monocytes Relative: 7 % (ref 3–12)
Neutro Abs: 10.9 10*3/uL — ABNORMAL HIGH (ref 1.7–7.7)

## 2010-07-25 LAB — COMPREHENSIVE METABOLIC PANEL
ALT: 124 U/L — ABNORMAL HIGH (ref 0–53)
AST: 24 U/L (ref 0–37)
AST: 30 U/L (ref 0–37)
AST: 49 U/L — ABNORMAL HIGH (ref 0–37)
Albumin: 2.6 g/dL — ABNORMAL LOW (ref 3.5–5.2)
Albumin: 2.7 g/dL — ABNORMAL LOW (ref 3.5–5.2)
Alkaline Phosphatase: 80 U/L (ref 39–117)
Alkaline Phosphatase: 95 U/L (ref 39–117)
BUN: 6 mg/dL (ref 6–23)
CO2: 25 mEq/L (ref 19–32)
CO2: 25 mEq/L (ref 19–32)
Calcium: 8.6 mg/dL (ref 8.4–10.5)
Chloride: 106 mEq/L (ref 96–112)
Creatinine, Ser: 0.73 mg/dL (ref 0.4–1.5)
GFR calc Af Amer: >60 mL/min (ref >60)
GFR calc Af Amer: >60 mL/min (ref >60)
GFR calc Af Amer: >60 mL/min (ref >60)
GFR calc non Af Amer: >60 mL/min (ref >60)
GFR calc non Af Amer: >60 mL/min (ref >60)
Potassium: 4.1 mEq/L (ref 3.5–5.1)
Sodium: 139 mEq/L (ref 135–145)
Sodium: 143 mEq/L (ref 135–145)
Total Bilirubin: 0.7 mg/dL (ref 0.3–1.2)
Total Protein: 6.8 g/dL (ref 6.0–8.3)

## 2010-07-25 LAB — CBC
HCT: 38.6 % — ABNORMAL LOW (ref 39.0–52.0)
MCHC: 33.7 g/dL (ref 30.0–36.0)
MCV: 88.6 fL (ref 78.0–100.0)
Platelets: 455 10*3/uL — ABNORMAL HIGH (ref 150–400)
Platelets: 520 10*3/uL — ABNORMAL HIGH (ref 150–400)
RBC: 3.88 MIL/uL — ABNORMAL LOW (ref 4.22–5.81)
RDW: 14.3 % (ref 11.5–15.5)
WBC: 10.9 10*3/uL — ABNORMAL HIGH (ref 4.0–10.5)
WBC: 14.4 10*3/uL — ABNORMAL HIGH (ref 4.0–10.5)

## 2010-08-22 NOTE — H&P (Signed)
Blake [name], Blake [name]             ACCOUNT NO.:  [id number]   MEDICAL RECORD NO.:  [id number]          PATIENT TYPE:  INP   LOCATION:  A340                          FACILITY:  [facility]   PHYSICIAN:  Scott A. Gerda Diss, MD    DATE OF BIRTH:  07/28/1980   DATE OF ADMISSION:  04/19/2008  DATE OF DISCHARGE:                              HISTORY & PHYSICAL   CHIEF COMPLAINT:  Right upper quadrant pain and discomfort.   HISTORY OF PRESENT ILLNESS:  He states he had severe pain and discomfort  that started around Friday and then progressed until Saturday with  multiple episodes of vomiting and discomfort.  Had to go to the ER and  receive some IV fluids as well.  Ate a steak sandwich at [facility]  along with a beer and then had more right upper quadrant pain and  discomfort.  Received more IV fluids on Sunday and a HIDA test was set  up for Monday morning.  A HIDA test was run and showed an ejection  fraction of 18%.  It should be noted that he had an ultrasound of his  liver and gallbladder region in August 2009 due to elevated liver  enzymes and at that time it just showed fatty liver.  Common bile duct  was normal and there were no stones.  Overall, patient has a good health  history except for hyperlipidemia and mild obesity.   FAMILY HISTORY:  Diabetes, heart disease, and hyperlipidemia.   SOCIAL HISTORY:  He is married.  He quit smoking in 2008.  He is  allergic to PENICILLIN.  He does take Tricor 145 mg daily.   REVIEW OF SYSTEMS:  Negative for headache, cough, shortness of breath,  swelling of the legs, chest pressure or pain.   EXAM:  VITAL SIGNS:  Stable.  Tympanic membranes NLT-NL.  NECK:  No masses.  CHEST:  CTA.  No crackles.  HEART:  Regular.  ABDOMEN:  Soft, no guarding, no rebound.  The area he points to, where  the pain was, is in the right upper quadrant where the gallbladder is.  EXTREMITIES:  No edema.   Met-7 shows that his potassium is 3.1.  A lipase was done and that  was  normal.  A urinalysis was done and is negative.  Liver enzymes are  normal.  Hemoglobin is good.  White count is good.  It should also be  noted that on 01/09 when he was in the ER his potassium was 4.2.   ASSESSMENT/PLAN:  1. Gallbladder dysfunction--Consult Dr. Suzette Battiest.  Most likely will      have gallbladder removed.  Ultrasound was ordered but it may not be      done.  2. Hypokalemia--Will supplement with oral potassium, as well as      intravenous potassium and recheck potassium in the morning.  3. The patient is a good surgical candidate.  Keep on clear liquids      tonight, n.p.o. after midnight.      Scott A. Gerda Diss, MD  Electronically Signed     SAL/MEDQ  D:  04/19/2008  T:  04/19/2008  Job:  [id number]

## 2010-08-22 NOTE — Op Note (Signed)
NAME:  Blake [name], Blake [name] NO.:  [id number]   MEDICAL RECORD NO.:  [id number]          PATIENT TYPE:  INP   LOCATION:  5155                         FACILITY:  [facility]   PHYSICIAN:  Graylin Shiver, M.D.   DATE OF BIRTH:  July 16, 1980   DATE OF PROCEDURE:  04/30/2008  DATE OF DISCHARGE:                               OPERATIVE REPORT   ENDOSCOPIC RETROGRADE CHOLANGIOGRAPHY WITH SPHINCTEROTOMY AND BILIARY  STENT PLACEMENT   INDICATIONS FOR PROCEDURE:  A 30 year old male status post laparoscopic  cholecystectomy recently who was now discovered to have a bile leak.   Informed consent was obtained after explanation of the risks of  bleeding, infection, perforation, and pancreatitis.   PREMEDICATION:  1. Fentanyl 150 mcg IV.  2. Versed 17 mg IV.  3. Benadryl 25 mg IV.  4. Robinul 0.2 mg IV.  5. Glucagon 0.25 mg IV.   PROCEDURE:  With the patient lying on his abdomen on the fluoroscopy  table, the lateral viewing duodenoscope was inserted into the oropharynx  and passed into the esophagus.  It was advanced down the esophagus, then  into the stomach.  No specific lesions were seen in the stomach.  The  duodenum was entered.  The duodenum looked normal.  The papilla of Vater  was located.  Selective cannulation was achieved of the common bile duct  using a guidewire and papillotome.  The guidewire was advanced up the  common duct into the biliary tree.  Contrast was injected into the  common bile duct and biliary tree.  An extravasation of contrast was  noted, compatible with a bile leak.  X-rays were obtained.  The  guidewire was then locked in place.  The sphincterotome was  appropriately placed and a small sphincterotomy was done for easier  access and to place a stent.  The papillotome was then removed, and an  8.5-French 7-cm long plastic biliary stent was placed in the common bile  duct.  All instruments were removed.  He tolerated the procedure well  without  complications.   IMPRESSION:  Bile leak with a biliary stent placed.           ______________________________  Graylin Shiver, M.D.     SFG/MEDQ  D:  05/10/2008  T:  05/10/2008  Job:  [id number]

## 2010-08-22 NOTE — H&P (Signed)
Blake [name], Blake [name]             ACCOUNT NO.:  [id number]   MEDICAL RECORD NO.:  [id number]          PATIENT TYPE:  INP   LOCATION:  A307                          FACILITY:  [facility]   PHYSICIAN:  Dalia Heading, M.D.  DATE OF BIRTH:  March 23, 1981   DATE OF ADMISSION:  04/23/2008  DATE OF DISCHARGE:  LH                              HISTORY & PHYSICAL   CHIEF COMPLAINT:  Left upper quadrant abdominal pain.   HISTORY OF PRESENT ILLNESS:  The patient is a 30 year old white male,  status post laparoscopic cholecystectomy for acalculous cholecystitis by  Dr. Tilford Pillar on April 20, 2008, who presented to the emergency  room this evening with acute onset of left upper quadrant abdominal  pain.  He states that was of acute onset.  He felt flushed and presented  to the emergency room for further evaluation and treatment.  He denies  any fever, chills, cough, shortness of breath, or chest pain.  He denies  any diarrhea.   PAST MEDICAL HISTORY:  Hypertension.   PAST SURGICAL HISTORY:  As noted above, appendectomy in the past.   CURRENT MEDICATIONS:  TriCor 145 mg p.o. daily.   ALLERGIES:  PENICILLIN.   REVIEW OF SYSTEMS:  As noted above.   SOCIAL HISTORY:  He quit smoking in 2008.   PHYSICAL EXAMINATION:  GENERAL:  The patient is a well-developed, well-  nourished white male who is anxious, pointing to his left upper quadrant  pain.  HEENT:  No scleral icterus.  LUNGS:  Clear to auscultation with equal breath sounds bilaterally.  HEART:  Regular rate and rhythm without S3, S4, murmurs.  ABDOMEN:  Soft and flat with tenderness on the left upper quadrant to  palpation.  Well-healed surgical scars are noted.  No  hepatosplenomegaly, masses, rigidity are noted.   LABORATORY DATA:  Lipase is elevated at 88, meth-7 is within normal  limits.  Total bilirubin is 0.5, alkaline phosphatase is 45, SGOT is 42,  SGPT is 107.  White blood cell count 13.8, hemoglobin 15.1, hematocrit  44.4,  and platelet count 329.   Chest x-ray is unremarkable except for low lung volume suggestive of  right basilar atelectasis.  Abdominal films were unremarkable.   Left upper quadrant abdominal pain of unknown etiology.  He does have  slightly elevated lipase, the significance of this is unknown.  He does  not have evidence for pulmonary embolus on examination as he points to  the left upper quadrant of the abdomen.   PLAN:  The patient to be admitted to hospital for pain control.  A CT  scan of the abdomen and pelvis has been ordered.  An amylase will be  added to his blood work.  Further management is pending of those  results.      Dalia Heading, M.D.  Electronically Signed     MAJ/MEDQ  D:  04/23/2008  T:  04/24/2008  Job:  [id number]   cc:   Donna Bernard, M.D.  Fax: [phone]

## 2010-08-22 NOTE — Consult Note (Signed)
NAMEDIMA, FERRUFINO NO.:  [id number]   MEDICAL RECORD NO.:  [id number]          PATIENT TYPE:  INP   LOCATION:  5155                         FACILITY:  [facility]   PHYSICIAN:  Juanetta Gosling, MDDATE OF BIRTH:  [birth year]   DATE OF CONSULTATION:  05/02/2008  DATE OF DISCHARGE:                                 CONSULTATION   PRIMARY PHYSICIAN:  Graylin Shiver, MD   CONSULTING PHYSICIAN:  Shirley Friar, MD.   HISTORY:  Mr. Morua is a 30 year old male who underwent a lap chole  on April 20, 2008, by Dr. Tilford Pillar of [facility] for what is  described as acute cholecystitis in his operative note.  The procedure  was without complication.  He had no bile leak and placed 3 endoclips on  the cystic duct.  He had an abnormal HIDA scan preop and a history of  right upper quadrant pain.  His pathology showed chronic cholecystitis  and cholesterolosis.  He was discharged home on the following day as he  was done late in the day and was tolerating the diet.  He returned to  [facility] postoperatively with severe abdominal pain and was discharged  home again after undergoing what was reported as a negative CT scan for  fluid and a negative HIDA scan for leak, although there is a portion of  the HIDA scan that was not able to viewed apparently.  He then came to  [facility] on April 30, 2008, for severe left-sided abdominal pain  that is mostly associated with food.  He had a fever at [facility] and  was placed on levofloxacin and Flagyl.  He was admitted here.  CT showed  a fluid collection, likely biloma, underwent an ERCP that was positive  for a leak and had a stent place.  He then had a repeat CT prior to  possibly draining the collection as it showed what appeared to be  resolution of this one large fluid collection.  He still did have some  fluid present around his spleen and around his stomach on that CT scan,  though that is not evaluated on the  second CT scan due to lack of  contrast I think.  He still is having continued pain with eating and he  had some nausea and says he has  developed a fever and tachycardia.   PAST MEDICAL HISTORY:  1. Hypertension.  2. Hypercholesterolemia.   PAST SURGICAL HISTORY:  Appendicitis and then he had a repeat  appendectomy for stump appendicitis.   ALLERGIES:  PENICILLIN.   SOCIAL HISTORY:  He is an ex-smoker and drinks occasional alcohol.  His  wife works as a Doctor, general practice at [employer].   MEDICATIONS:  TriCor.   PHYSICAL EXAMINATION:  VITAL SIGNS:  Temperature 103.5, pulse 138, blood  pressure 118/78, respirations 18, and 92% on 2 L.  GENERAL:  He is a well-appearing male in no distress.  HEART:  Tachycardic.  LUNGS:  Clear bilaterally.  ABDOMEN:  Soft, nontender and wounds are clean.   LABORATORY DATA:  Lipase of 99.  White count of 18, hematocrit of 41.  BUN and creatinine are 6 and 0.88.  His alkaline phosphatase is 47,  transaminase of 26 and 43 and his total bilirubin is 1.2.   ASSESSMENT AND PLAN:  Bile leak, status post laparoscopic  cholecystectomy likely with retained left upper quadrant bilious fluid  as a source of his pain.  We discussed course of bile and even though  the leak is now stopped and there is no percutaneous drainable  collection, I think  certainly there is at least some left upper  quadrant bile that is still there and causing this symptoms.  He is also  having hiccups whenever he takes a deep breath as well.  Other cause  include possible pancreatitis, trocar injury from his from his  operation, but I doubt that these are the source.  He is getting CMET  right now and is also going to get an ultrasound to see if there is any  fluid either in his right upper quadrant or his left upper quadrant.  I  did have a long discussion with them about possible diagnostic  laparoscopy and peritoneal washout as the treatment for what I think is  most likely  bile peritonitis, status post a bile leak from a  laparoscopic cholecystectomy.      Juanetta Gosling, MD  Electronically Signed     MCW/MEDQ  D:  05/02/2008  T:  05/03/2008  Job:  [id number]   cc:   Tilford Pillar, MD  Bernette Redbird, M.D.

## 2010-08-22 NOTE — Op Note (Signed)
NAMEKHAIRI, GARMAN NO.:  [id number]   MEDICAL RECORD NO.:  [id number]          PATIENT TYPE:  AMB   LOCATION:  ENDO                         FACILITY:  [facility]   PHYSICIAN:  Graylin Shiver, M.D.   DATE OF BIRTH:  March 01, 1981   DATE OF PROCEDURE:  06/07/2008  DATE OF DISCHARGE:                               OPERATIVE REPORT   INDICATIONS FOR PROCEDURE:  To remove a biliary stent that was placed  previously because of the bile leak.   Informed consent was obtained after explanation of the risks of  bleeding, infection, and perforation.   PREMEDICATION:  1. Fentanyl 100 mcg IV.  2. Versed 7 mg IV.   PROCEDURE:  With the patient in the left lateral decubitus position, the  Pentax lateral viewing duodenoscope was inserted into the oropharynx and  passed into the esophagus.  It was advanced down the esophagus, then  into the stomach and into the duodenum.  The biliary stent was seen  coming out of the antral area.  This was grasped with a snare and  removed without difficulty.  No other abnormalities were noted on this  exam.  He tolerated the procedure well without complications.   IMPRESSION:  Biliary stent, removed.           ______________________________  Graylin Shiver, M.D.     SFG/MEDQ  D:  06/07/2008  T:  06/08/2008  Job:  [id number]

## 2010-08-22 NOTE — Discharge Summary (Signed)
Blake [name], Blake [name]             ACCOUNT NO.:  [id number]   MEDICAL RECORD NO.:  [id number]          PATIENT TYPE:  INP   LOCATION:  5155                         FACILITY:  [facility]   PHYSICIAN:  Thornton Park. Daphine Deutscher, MD  DATE OF BIRTH:  [birth year]   DATE OF ADMISSION:  04/30/2008  DATE OF DISCHARGE:  05/12/2008                               DISCHARGE SUMMARY   ADMITTING PHYSICIAN:  Graylin Shiver, MD   DISCHARGING PHYSICIAN:  Thornton Park. Daphine Deutscher, MD   CONSULTANTS:  1. Juanetta Gosling, MD with [facility].  2. Interventional Radiology.   PROCEDURES:  1. The patient had an ERCP with sphincterotomy and biliary stent      placement on April 30, 2008.  2. Laparoscopic washout by Dr. Colin Benton on May 03, 2008.   REASON FOR ADMISSION:  Mr. 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  He had a  laparoscopic cholecystectomy at [facility] on April 20, 2008.  On April 23, 2008, he returned to [facility] due to pain.  At  that time, he was admitted after several days of n.p.o. status.  He was  able to eat and was discharged home on April 28, 2008.  At that time,  his pain returned on April 29, 2008, and at that time, he presented to  [facility] Emergency Department.  At that time, he was complaining of  severe left-sided abdominal pain, which was somewhat improved with  Dilaudid.  At this time, he was nauseated, but did not have any vomiting  and was spiking fevers.  At that time, he was currently on Levaquin as  well as Flagyl.  Please see admitting history and physical for further  details.   ADMITTING DIAGNOSES:  1. Abdominal pain.  2. Status post laparoscopic cholecystectomy.   HOSPITAL COURSE:  At this time, the patient was admitted to Dr. Beverlee Nims service with [facility].  He was also started on  Cipro as well as Flagyl and kept n.p.o.  At this time, apparently the  patient also underwent an ERCP with biliary stent placement.  The  patient also had a CT scan of the abdomen and pelvis which showed  perihepatic fluid collection and at this time, Interventional Radiology  was consulted to place a drain in this on the following day.  On the  following day, apparently a repeat CT scan during drain placement showed  a resolution of this perihepatic fluid collection; therefore, a drain  was not placed.  At this time, the patient's white count was beginning  to increase and was already at 15,000.  The patient continued in severe  pain as well as an increasing white count over the next several days.  On hospital day 3, [facility] was consulted.  At this  time, the patient's white count was continuing to rise.  A bile leak was  confirmed and there was a question of retained left upper quadrant  bilious fluid as the source of the patient's pain.  On the following  day,  the patient had a temperature of 103 with a pulse of 140 and a  white count that is elevated up to around 36,000.  Therefore at this  time, it was felt that the patient had bile peritonitis and was taken to  the operating room where a laparoscopic washout was concluded.  At this  time, a left upper quadrant biloma was discovered and it was irrigated.  At some point, the patient's Cipro and Flagyl was discontinued and the  patient was started on Avelox.  Over the next several days, the patient  began to feel better; however, he was still spiking temperatures.  Also  at this time, his white blood cell count began to slowly trend down.  By  postoperative day 4, the patient's white count had bottomed down at  15,800; however, on this particular day, it began to increase again.  Also at this time, it was noticed that the JP drain that was left in  place during surgery was beginning to put out bilious output.  On the  following day due to the patient's white blood cell count continuing to   increase as well as the patient having continued pain, a repeat CT scan  was obtained, which showed another fluid collection near the lesser sac  in the abdomen.  Therefore at this time, Interventional Radiology was  once again consulted and a drain was placed in this fluid collection.  Also at this time, the patient was stopped on Avelox and Invanz was  started.  Over the next several days, the patient tolerated this drain  well.  It was draining serosanguineous material.  The patient's white  count had almost normalized back to 10,900.  On exam, his abdomen was  soft, nontender, and nondistended with active bowel sounds.  His first  surgical drain, which is a JP drain still had bilious output; however,  his second drain that Interventional Radiology placed had more  serosanguineous output.  At this time, the patient was obviously  improving with no longer spiking fevers or white count, and at this time  was felt stable for discharge with close followup.   DISCHARGE DIAGNOSES:  1. Status post laparoscopic cholecystectomy.  2. Biloma.  3. Status post laparoscopic washout and irrigation.  4. Subhepatic fluid collection for which he is status post      percutaneous drainage of this.  5. Leukocytosis, which is much improved.   DISCHARGE MEDICATIONS:  The patient is informed that he may resume home  medications including TriCor 160 mg daily and is given a new  prescription for Vicodin 5/325 one to two tablets q.4 h. p.r.n. pain.   DISCHARGE INSTRUCTIONS:  Mr. Blake [name] is informed that he may take  several weeks off of work as needed to recover from this ordeal.  He is  to follow up with Dr. Colin Benton in 1 week to reassess his condition as well  as his drains and she can address return to work issues at that time.  Otherwise in the meantime, the patient does not have any dietary  restrictions.  He may increase his activity slowly, may walkup steps.  He may shower with these drains in place.   However, he is not to bathe  as long as he has these drains in place.  Otherwise, he is still to not  do too much heavy lifting greater than 15 or 20 pounds, and he is not to  drive while taking any narcotic pain medication.  Otherwise, he is  informed that he needs to call our office if he begins to get a fever  greater than 101.5, worsening abdominal pain, or any new situation with  these drains.  In the meantime, the patient's wife is a Engineer, civil (consulting) at [employer]  and is very knowledgeable on drain management and will be taking care of  his drain.  Otherwise, the patient is to return to see Dr. Matthias Hughs or  Dr. Evette Cristal in several weeks with the thought of doing a stent removal in  approximately 6 weeks.      Letha Cape, PA      Thornton Park Daphine Deutscher, MD  Electronically Signed    KEO/MEDQ  D:  05/12/2008  T:  05/13/2008  Job:  [id number]   cc:   Graylin Shiver, M.D.  Alfonse Ras, MD  Bernette Redbird, M.D.

## 2010-08-22 NOTE — Consult Note (Signed)
Blake [name], Blake [name]             ACCOUNT NO.:  [id number]   MEDICAL RECORD NO.:  [id number]          PATIENT TYPE:  INP   LOCATION:  A340                          FACILITY:  [facility]   PHYSICIAN:  Tilford Pillar, MD      DATE OF BIRTH:  11-30-1980   DATE OF CONSULTATION:  04/19/2008  DATE OF DISCHARGE:                                 CONSULTATION   REASON FOR CONSULTATION:  Abdominal pain.   HISTORY OF PRESENT ILLNESS:  The patient is a 30 year old male with a  history of obesity and hypercholesterolemia who presented to [facility] over the weekend with an acute episode of right upper quadrant  abdominal pain.  He does state he had similar symptoms in the past,  actually had workup in the past consistent with biliary dyskinesia, but  was told only to monitor his gallbladder in the past.  His symptoms were  so severe after eating a rib eye  sandwich on Friday that he did present  to the emergency department, was hydrated, at which time his nausea and  vomiting and diarrhea somewhat improved.  He did return back to the home  and on Sunday morning, had a piece of buttered toast.  He did not have  exacerbations of this symptoms again.  He again returned to the  emergency department, was reevaluated, hydrated, and with this pain  controlled, he was again discharged with plans for HIDA scan evaluation  as of this morning.  He underwent the HIDA scan, was noted to have  ejection fraction of 17.5 and did have some increasing nausea associated  with the administration of the half and half oral agents.  With that  set, the results were discussed with his primary care physician Dr.  Gerda Diss and he was directly admitted to the hospital under Dr. Fletcher Anon  care.  Currently, he states his pain is improved.  He has not had any  symptoms since he passed out yesterday.  He has had some chills and a  low-grade subjective fever.  He had no additional episodes of emesis,  his diarrhea had somewhat  improved.  He has had no melena, no  hematochezia, no dysuria, no hematuria, no history of jaundice.   PAST MEDICAL HISTORY:  1. Hypercholesterolemia.  2. Obesity.   PAST SURGICAL HISTORY:  Laparoscopic appendectomy and a return for an  appendiceal phlebitis, both done laparoscopically.   MEDICATIONS:  Reglan, Tricor, simvastatin.   ALLERGIES:  PENICILLIN, although the patient is unaware of reaction, but  states that his mother told him as a child that he is not have  penicillin.   SOCIAL HISTORY:  He does smoke tobacco and occasionally uses alcohol.   FAMILY HISTORY:  He does have pertinent family history of his mother  having history of biliary disease.   PHYSICAL EXAMINATION:  VITAL SIGNS:  Temperature 98.2, heart rate 52,  respiration 18, blood pressure 132/67.  GENERAL:  The patient is lying in supine position on hospital bed.  He  is not in any acute distress.  He is alert and oriented x3.  On initial  entry into the room, he was sitting in a chair in the room, but was able  to ambulate to the bed without any difficulties.  HEENT:  Scalp, no deformities.  No masses.  Eyes, pupils equal, round,  and reactive.  Extraocular movements are intact.  There is no scleral  icterus or conjunctival pallor is noted.  Oral mucosa pink, normal  occlusion.  NECK:  Trachea is midline.  PULMONARY:  Unlabored respirations.  He is clear to auscultation  bilaterally.  CARDIOVASCULAR:  Regular rate and rhythm.  No murmurs or gallops are  apparent in the room today.  He had 2+ radial pulses bilaterally.  ABDOMINAL:  Positive bowel sounds.  Abdomen soft, obese.  Nontender,  nondistended.  There is no Murphy's sign, deep inspiration.  EXTREMITIES:  Warm and dry.   PERTINENT LABORATORY AND RADIOGRAPHIC STUDY:  Laboratory results from  April 18, 2008, demonstrates CBC, white blood cell count 9.1,  hemoglobin 14.7, hematocrit 42.8, platelets 215.  Basic metabolic panel,  sodium 135, potassium  3.1, chloride 104, bicarb 25, BUN 10, creatinine  1.10, blood glucose 103.  Urinalysis is negative.  Blood work today is  pending.  HIDA scan demonstrates an ejection fraction of 18%.  He did  have some nausea associated with this.  There was gallbladder filling on  the HIDA scan.  He has had a previous ultrasound within the computer  system, which demonstrated no abnormalities with anatomy of the  gallbladder, no wall thickening or biliary tree distention there is no  evidence of kidney stones.   ASSESSMENT AND PLAN:  Acute acalculous cholecystitis.  At this time the  risks, benefits, and alternatives of the laparoscopic possible open  cholecystectomy were discussed at length with the patient including, but  not limited to risk of bleeding, infection, bile leak, small bowel  injury, common bile duct injury, as well as possibility of  intraoperative cardiac or pulmonary events.  Additionally, I did discuss  that the patient other etiology of right upper quadrant abdominal pain,  including hepatic, pancreatic, gastrointestinal with associated peptic  ulcer disease, although my suspicion is extremely low.  I think the  patient, should he have any continued symptomatology following this  procedure that upper endoscopy may be required in the future, although  my suspicion of a peptic ulcer of an inflammatory gastric or intestinal  process was extremely low.  With this, we discuss with the patient that  he maintain clear liquids for today, and in midnight he will be made  n.p.o. for planned procedure in the operating room tomorrow.  Continue  on IV fluid hydration, IV antibiotics will be hold during the operation  and repeat labs in the morning.      Tilford Pillar, MD  Electronically Signed     BZ/MEDQ  D:  04/19/2008  T:  04/20/2008  Job:  [id number]   cc:   Lorin Picket A. Gerda Diss, MD  Fax: [phone]

## 2010-08-22 NOTE — Op Note (Signed)
Blake [name], SPRAGGINS NO.:  [id number]   MEDICAL RECORD NO.:  [id number]          PATIENT TYPE:  INP   LOCATION:  5155                         FACILITY:  [facility]   PHYSICIAN:  Alfonse Ras, MD   DATE OF BIRTH:  1981/03/31   DATE OF PROCEDURE:  05/03/2008  DATE OF DISCHARGE:                               OPERATIVE REPORT   PREOPERATIVE DIAGNOSES:  Probable bile peritonitis, status post  laparoscopic cholecystectomy, status post endoscopic retrograde  cholangiopancreatography with worsening fever, leukocytosis, and pain.   DESCRIPTION:  The patient was taken to the operating room after  extensive informed consent was granted with the family discussing the  need for laparoscopy, possible laparotomy, and drainage of what was  presumed to be bile peritonitis.  They understand the risks, benefits,  and options and wished to proceed.  He was given general anesthesia  using endotracheal tube in the supine position.  Foley catheter was  placed.  Abdomen was prepped and draped in normal sterile fashion.  Using the previous transverse supraumbilical incision, I dissected down  to the fascia, and the fascia was opened.  Peritoneal access was  obtained and there was bilious fluid encountered.  An 0 Vicryl  pursestring suture was placed around the fascial defect and the Hasson  trocar was placed in the abdomen.  Under direct vision, originally a 5-  mm trocar was placed in subxiphoid region and then this was extended to  a 10-mm trocar.  Omental adhesions were taken down from the anterior  abdominal wall.  There was a large amount of bile both solidified and  liquid overlying the liver and then the intrahepatic space.  I irrigated  with about 10 liters of fluid all in the upper abdomen and pelvis in the  right pericolic gutter until the fluid returned clear.  A 19 Blake drain  was then placed in the subhepatic space and brought out through a stab  wound in the right abdomen.   Fascial defect was closed with the 0 Vicryl  pursestring suture.  Skin incisions were closed with subcuticular 4-0  Monocryl.  Incisions were injected with 0.5% Marcaine.  The patient  tolerated the procedure well, went to PACU in good condition.      Alfonse Ras, MD  Electronically Signed     KRE/MEDQ  D:  05/03/2008  T:  05/04/2008  Job:  [id number]

## 2010-08-22 NOTE — Discharge Summary (Signed)
Blake [name], Blake [name] NO.:  [id number]   MEDICAL RECORD NO.:  [id number]          PATIENT TYPE:  INP   LOCATION:  A307                          FACILITY:  [facility]   PHYSICIAN:  Tilford Pillar, MD      DATE OF BIRTH:  1980/10/10   DATE OF ADMISSION:  04/23/2008  DATE OF DISCHARGE:  01/20/2010LH                               DISCHARGE SUMMARY   ADMISSION DIAGNOSES:  Severe left upper quadrant abdominal pain.   DISCHARGE DIAGNOSES:  1. Postoperative pancreatitis.  2. Recent laparoscopic cholecystectomy.  3. Hyperlipidemia.  4. Obesity.   DISPOSITION:  Home.   BRIEF HISTORY AND PHYSICAL:  Please see the admission history and  physical for complete H and P.  The patient is a 30 year old male  recently status post laparoscopic cholecystectomy by myself who had  acute onset of left upper quadrant abdominal pain.  He presented to the  emergency department, was evaluated and the patient was admitted by my  partner for continued evaluation and management.   HOSPITAL COURSE:  The patient was admitted on April 23, 2008.  He did  have a CT evaluation in the emergency department which did demonstrate  small amount of free intra-abdominal fluid and air both potentially  consistent with his recent laparoscopic cholecystectomy and CO2  pneumoperitoneum and sterile saline irrigation.  Additionally on the CT  there was noted to be some inflammation around the tail of the pancreas  and he was initially admitted for evaluation and plans for an MRCP.  However, due to his recent operative intervention MRCP was not possible  and at this point plans were to continue IV fluid hydration, IV  antibiotics and obtaining a HIDA scan.  He did obtain a HIDA scan which  in discussion with the radiologist was noted not to demonstrate any  clear evidence of a bile leak or bile fluid collection.  Consultation  was also obtained by phone with his primary gastroenterologist who  agreed with the  continued course and management.  The patient did have  continued resolution of the symptoms and left upper quadrant pain was  reported by the patient to be improving and based on this he was started  back on a diet.  He actually tolerated his diet very well with no  exacerbation of his symptomatology.  His pain was continued to be  maintained on prehospital doses of his pain medication.  He continued  not to demonstrate any signs of fever or chills and had continued  resolution of his liver enzymes.  Therefore, on April 28, 2008, it was  discussed with the patient the possibility of discharge.  The patient  was comfortable with this and as his symptomatology had improved plans  were for discharge on April 28, 2008.   DISCHARGE INSTRUCTIONS:  The patient was instructed to continue a low  residual diet, and was instructed to avoid fatty, greasy foods and  instructed to continued to avoid soaking his incisions and continue to  wash with soap and water.  He may continue to increase his activity  slowly.  He may walk up steps.  He is still restricted from lifting over  20 pounds for the next 3-4 weeks.  He is still not to drive while taking  pain medications.  He is return to see me in 1 week and he was also to  return to see his primary care physician in 1 week.  He was instructed  that should any of the symptomatology return or should he have any  questions or concerns he is to either call me or return to the hospital  as required.  The patient understands this and will be discharged.   DISCHARGE MEDICATIONS:  1. The patient is to continue all previously prescribed home      medications including TriCor.  2. He is to continue Lortab 5/500 one to two p.o. q.4 hours p.r.n.      pain.  3. Levaquin 500 mg p.o. daily x5 days.  4. Flagyl 500 mg p.o. t.i.d. x5 days.      Tilford Pillar, MD  Electronically Signed     BZ/MEDQ  D:  05/06/2008  T:  05/06/2008  Job:  [id number]

## 2010-08-22 NOTE — Op Note (Signed)
NAMESIGURD, PUGH             ACCOUNT NO.:  [id number]   MEDICAL RECORD NO.:  [id number]          PATIENT TYPE:  INP   LOCATION:  A340                          FACILITY:  [facility]   PHYSICIAN:  Tilford Pillar, MD      DATE OF BIRTH:  24-Sep-1980   DATE OF PROCEDURE:  04/20/2008  DATE OF DISCHARGE:  04/21/2008                               OPERATIVE REPORT   PREOPERATIVE DIAGNOSIS:  Acute acalculous cholecystitis.   POSTOPERATIVE DIAGNOSIS:  Acute acalculous cholecystitis.   PROCEDURE:  Laparoscopic cholecystectomy.   SURGEON:  Tilford Pillar, MD   ANESTHESIA:  General tracheal, local anesthetic 25% Sensorcaine plain.   SPECIMEN:  Gallbladder.   ESTIMATED BLOOD LOSS:  Minimal.   INDICATIONS:  The patient is a 30 year old male who presented to [facility] Emergency Department over the weekend with acute onset of right  upper quadrant abdominal pain.  He had a previous HIDA scan, which  demonstrated biliary dyskinesia.  However, this was in the distant past.  His history conservatively was actually discharge from the Emergency  Department with plans for HIDA scan on Monday morning.  This HIDA scan  demonstrate an 80% ejection fraction and did have some associated  nausea, although did not clearly re-exacerbate all of the  symptomatology.  He was therefore brought back to the hospital by his  primary care physician for admission and surgical consultation  evaluation, suspicion of acute acalculous cholecystitis was discussed  with the patient.  Risks, benefits and alternatives of a laparoscopic  possible open cholecystectomy were discussed at length with the patient  including, but not limited to risk of bleeding, infection, bile leak,  small bowel injury, common bile duct injury as well as possibility of  intraoperative cardiac and pulmonary events.  In addition, it was  discussed with the patient possible concomitant symptomatology, which  may not improve upon removal of the  gallbladder.  His questions and  concerns were addressed.  The patient was consented for planned  procedure.   DESCRIPTION OF PROCEDURE:  The patient was taken to the operating, he  was placed in supine position on the operating room table, at which time  the general anesthetic was administered.  Once the patient was asleep,  he was endotracheally intubated by Anesthesia.  At this point, his  abdomen was prepped with DuraPrep solution and drapes were placed in  standard fashion.  Due to the past history of a prior laparoscopic  surgery x2 he had both supra and infraumbilical scars, therefore, I did  opt to place a Veress needle at Palmer's point in the left subcostal  margin.  This was placed to a stab incision, placement of the Veress  needle.  Saline drop test was utilized to confirm intraperitoneal  placement and then pneumoperitoneum was  initiated.  Once sufficient  pneumoperitoneum was obtained, a 11-mm trocar was placed through a stab  incision in the epigastrium.  This was placed over a laparoscope  allowing visualization of trocar entering into the peritoneal cavity.  At this time, the inner cannula was removed.  The laparoscope was  reinserted.  There  is no evidence of any trocar placement injury and at  this time I did evaluate and inspect the periumbilical area.  There was  a small omental adhesion distally in the pelvis.  This was left alone,  and the umbilical area appeared free of any hernias or scar tissue or  any adhesions.  Therefore I did created additional stab incision  supraumbilically and placed a 11-mm trocar under direct visualization  with the laparoscope.  At this time, I did exchange the laparoscope to  the umbilical trocar site, reinspected the area of Veress needle  placement.  This was noted to be atraumatic and the Veress needle was  removed.  There was a significant amount of omental adhesions around the  right subcostal margin.  A 5-mm trocar was placed  in the midline between  the two 11-mm trocars and a combination of electrocautery and sharp  dissection was utilized to free the omental adhesions off the right  costal margin.  This exposed the right lobe of the liver and the fundus  of the gallbladder.  An additional 5-mm trocar was placed into the right  lateral abdominal wall and then attention was turned to proceeding with  the laparoscopic cholecystectomy.   The fundus of gallbladder was grasped and lifted up and over the right  lobe of the liver.  Blunt [place] dissection was utilized to bluntly  strip the peritoneal reflection off the infundibulum of the gallbladder  exposing the cystic duct as it entered the infundibulum.  A window was  created behind the cystic duct.  Three Endoclips were place  proximally,  one distally and cystic duct was divided between the two most distal  clips.  Similarly the cystic artery was identified.  Two Endoclips were  placed proximally, one distally and cystic artery was divided between  the two most distal clips.  Electrocautery was then utilized to dissect  the gallbladder free from gallbladder fossa.  Once it was free, it was  placed into a EndoCatch bag, was placed up and over the right lobe of  the liver.  At this time, the gallbladder fossa was evaluated.  Hemostasis was excellent having being controlled with electrocautery and  at this time, the peritoneal cavity was copiously irrigated with sterile  saline until returning aspirate was clear.  At this time, attention was  turned to closure using a Endoclose suture passing device.  A 2-0 Vicryl  was placed at both the epigastric and umbilical trocar sites with these  sutures in place the EndoCatch bag was grasped and was removed through  the epigastric trocar site.  This was removed through with minimal  dilatation of the trocar site and the gallbladder was removed in an  intact EndoCatch bag, which was placed on the back table and sent as   permanent specimen to Pathology.  At this time, the pneumoperitoneum was  evacuated.  The Vicryl sutures were secured.  The local anesthetic was  instilled, a 4-0 Monocryl was utilized to reapproximate the skin edges  and a running subcuticular suture  The skin was washed and dried with  moist dry towel.  Benzoin was applied around incision, 0.5 inch Steri-  Strips were placed.  The patient was allowed to come out of the general  anesthetic and was transferred back to regular hospital bed.  He was  transferred to Post-Anesthetic Care Unit in stable condition.  At the  conclusion of procedure, all instrument, sponge, and needle counts were  correct.  The  patient tolerated the procedure well.      Tilford Pillar, MD  Electronically Signed     BZ/MEDQ  D:  04/22/2008  T:  04/23/2008  Job:  [id number]   cc:   Lorin Picket A. Gerda Diss, MD  Fax: [phone]   Primary care physician

## 2010-08-22 NOTE — Discharge Summary (Signed)
Blake [name], Blake [name]             ACCOUNT NO.:  [id number]   MEDICAL RECORD NO.:  [id number]          PATIENT TYPE:  INP   LOCATION:  A340                          FACILITY:  [facility]   PHYSICIAN:  Tilford Pillar, MD      DATE OF BIRTH:  07-30-1980   DATE OF ADMISSION:  04/19/2008  DATE OF DISCHARGE:  01/13/2010LH                               DISCHARGE SUMMARY   ADMISSION DIAGNOSIS:  Epigastric pain with biliary dyskinesia.   DISCHARGE DIAGNOSES:  1. Biliary dyskinesia status post laparoscopic cholecystectomy.  2. Hypercholesterolemia.  3. Obesity.   ADMITTING PHYSICIAN:  Scott A. Gerda Diss, MD   PROCEDURE:  Laparoscopic cholecystectomy.   BRIEF HISTORY AND PHYSICAL:  Please see the admission history and  physical for complete H and P.  The patient is a 30 year old male with a  history of epigastric abdominal pain.  He was evaluated over the weekend  at [facility] and had a followup HIDA scan on Monday morning.  This did demonstrate decreased ejection fraction with increasing some of  his symptomatology.  He was admitted by his primary care physician on  April 19, 2008, for continued management and surgical intervention.   HOSPITAL COURSE:  The patient was admitted.  He has taken on the  subsequent day to the operating room on April 20, 2008, for a  laparoscopic cholecystectomy.  He tolerated this extremely well.  Postoperatively, he was advanced on his diet and his pain was controlled  on oral pain medications.  He was ambulatory with no difficulties or  problems, and on April 21, 2008, the patient was discharged to home.   DISCHARGE INSTRUCTIONS:  The patient was instructed to resume a normal  diet.  He may wash the area with soap and water.  He may increase his  activity slowly as tolerated.  He may walk up steps.  He may shower but  not to soak the area of the next 3 weeks.  He is not to lift anything  greater than 20 pounds for the next 3 weeks.  He is not to  drive while  taking pain medications.  He is to return to see me in 3 weeks.  He is  to return to Dr. Gerda Diss as scheduled.   DISCHARGE MEDICATIONS:  1. Lortab 5/500 one to two p.o. q.4 h. p.r.n. pain.  2. Ibuprofen 400 mg p.o. q.6 h. p.r.n. pain.  3. He is to continue previously prescribed home medications including      Reglan, Tri-Chlor, and simvastatin.      Tilford Pillar, MD  Electronically Signed     BZ/MEDQ  D:  05/06/2008  T:  05/07/2008  Job:  [id number]

## 2010-08-22 NOTE — Consult Note (Signed)
NAMEGURNIE, [name] NO.:  [id number]   MEDICAL RECORD NO.:  [id number]          PATIENT TYPE:  INP   LOCATION:  5155                         FACILITY:  [facility]   PHYSICIAN:  Graylin Shiver, M.D.   DATE OF BIRTH:  07-Nov-1980   DATE OF CONSULTATION:  DATE OF DISCHARGE:                                 CONSULTATION   We were called by the emergency room physician to see Mr. Blake [name] this  morning for severe abdominal pain.   HISTORY OF PRESENT ILLNESS:  This is a 30 year old male who was  diagnosed with biliary dyskinesia on April 19, 2008.  He had a  laparoscopic cholecystectomy on April 20, 2008 and returned to the  Emergency Department at [facility] in pain on April 23, 2008.  He was admitted to [facility] and after several days of n.p.o. status,  was able to eat and discharged to home on April 28, 2008.  His pain  returned in with severe on April 29, 2008 and he presents to the  emergency department early this morning of April 30, 2008.  He is  currently on severe pain on his left side.  The pain is somewhat muted  with Dilaudid.  It does become more severe with any p.o. intakes.  The  patient tells me that he is nauseated, but has not vomited.  He did  spike a fever this past week and is currently on Levaquin and Flagyl.  His wife is an emergency department nurse at [employer].  He  tells me his last narcotics use was in 2007 after an appendectomy.   He has a past medical history of hypertension and hyperlipidemia.   He had an appendectomy done twice, the second time was in 2007.  He had  a laparoscopic cholecystectomy in April 20, 2008 by Dr. Leticia Penna at  [place] for acalculous cholecystitis.   Current medications include TriCor 145 mg as well as Levaquin, and  Flagyl.   He has an allergy to PENICILLIN.   SOCIAL HISTORY:  He is an ex-smoker, smoked for approximately 60 years  and quit several years ago.  He drinks  approximately 2 beers per week.   Family history is negative for pancreatic or liver disease.   On physical exam, he is alert and oriented, does appear to be in some  pain.  Temperature is 97.4, pulse 103, respirations 20, blood pressure  138/92, heart has a regular rate and rhythm.  Lungs are clear to  auscultation.  It is painful for him to take a deep inspiration when I  auscultate him.  Abdomen is mildly distended, his surgical incisions  look to be healing well.  They are clean and dry.  He is not  particularly tender.  He does have decreased bowel sounds.   Labs show a hemoglobin of 13.7, hematocrit 40.3, white count 15.0,  platelets 390,000.  Potassium 3.9, BUN 6, creatinine 0.97.  Lipase 131,  amylase 78.  LFTs show an AST of 30, ALT 61, alk phos 64, total  bilirubin 1.2.  He had an abdominal x-ray this morning  that was  negative.  A chest x-ray this morning that showed mild bronchitic  changes.  HIDA scan on April 27, 2008 was negative for bile leak and  after re-reviewing this HIDA scan with Dr. Bonnielee Haff, I have learned that  this study was suboptimal and it is felt that it was not able to rule  out bile leak.  On April 24, 2008, he had a CT done of his abdomen and  pelvis that showed some fluid collecting near the pancreatic tail,  otherwise it was negative.   ASSESSMENT:  Dr. Herbert Moors is seen and examined the patient, collected  a history and reviewed his chart.  His impression is that this is a 63-  year-old male in severe abdominal pain after laparoscopic  cholecystectomy.  We will admit evaluate the beginning with another  routine abdomen and pelvic CT scan give IV hydration and pain  medications as well as antibiotics.      Stephani Police, PA    ______________________________  Graylin Shiver, M.D.    MLY/MEDQ  D:  04/30/2008  T:  04/30/2008  Job:  [id number]   cc:   Tilford Pillar, MD

## 2010-08-25 NOTE — H&P (Signed)
NAMETHEOTIS, GERDEMAN             ACCOUNT NO.:  [id number]   MEDICAL RECORD NO.:  [id number]          PATIENT TYPE:  INP   LOCATION:  0098                         FACILITY:  [facility]   PHYSICIAN:  Sandria Bales. Ezzard Standing, M.D.  DATE OF BIRTH:  [birth year]   DATE OF ADMISSION:  10/25/2005  DATE OF DISCHARGE:                                HISTORY & PHYSICAL   HISTORY OF PRESENT ILLNESS:  Mr. Sachse is a 30 year old white male who  works here in [place].  Developed periumbilical abdominal pain this  morning.  Went to [facility] about 4 p.m. today.  He was then  referred to [facility] Emergency Room where he was seen and evaluated.  He  had a CT scan which was read by Dr. Myles Rosenthal and was considered consistent  with appendicitis.   The patient has no history of peptic ulcer disease, liver disease,  pancreatic disease, or colon disease.  Interestingly, he did have a  laparoscopic appendectomy by Dr. Elpidio Anis in [place] in February 2006.  He apparently had a drain in place during the post operative period.  He  said it took him some time to get over the operation.   ALLERGIES:  PENICILLIN.  He had a rash as a child.   MEDICATIONS:  He is on no medications.   REVIEW OF SYSTEMS:  NEUROLOGIC:  No history of seizure or loss of  consciousness.  PULMONARY:  Smokes about half a pack of cigarettes a day.  Knows this is bad  for his health.  CARDIAC:  He has no history of heart disease or chest pain.  GASTROINTESTINAL:  See HPI.  UROLOGIC:  No history of kidney stones or kidney infection.   SOCIAL HISTORY:  He is accompanied by his wife and his mother who are at his  beside.   PHYSICAL EXAMINATION:  VITAL SIGNS: Temperature 98.9, blood pressure 129/65,  pulse 107, respirations 20.   GENERAL:  He is a well-nourished, mildly obese white male, alert and  cooperative with physical examination.  HEENT: Unremarkable.  NECK:  Supple.  I feel no mass or thyromegaly.  LUNGS:   Clear to auscultation with symmetric breath sounds.  ABDOMEN:  He has some mild tenderness, sort of a right lateral flank or  maybe right upper quadrant but no guarding or rebound, though he has been  given 4 mg of Dilaudid which may change his exam.  EXTREMITIES:  He has good strength in the upper and lower extremities.   LABORATORY DATA:  The labs that I have show a sodium 138, potassium 3.6,  chloride 101, creatinine 1.2.  His white blood count is 21,000.  Hemoglobin  17, hematocrit 49.  He has 9% neutrophils.   I spoke with Dr. Eppie Gibson and we went over the CT films together.  It looks  like he has a high riding cecum which is above the level of his umbilicus.  He has what appears to be retrocecal appendix which reaches up to the tip of  is right lobe of the liver.  At the tip of the appendix, which is at  the  bottom tip of the right lobe of the liver there are at least six metal clips  that are probably from his prior surgery.   DIAGNOSIS:  1.  Appendicitis.  Possibley secondary to an appendiceal stump.  Discussed      with the patient and his wife about proceeding with surgery.  I will      start outlaparoscopically.  He may need open surgery depending on the      findings..  His risks of sugery, include infection which I think he      already has, bleeding, possibility of open surgery and the possibility      of injuring his bowel.  We will go to the operating room tonight.  2.  Smokes and knows this is bad for his health.  3.  Mildly overweight.      Sandria Bales. Ezzard Standing, M.D.  Electronically Signed     DHN/MEDQ  D:  10/26/2005  T:  10/26/2005  Job:  [id number]   cc:   [facility]

## 2010-08-25 NOTE — Op Note (Signed)
NAMEPIER, Blake [name]             ACCOUNT NO.:  [id number]   MEDICAL RECORD NO.:  [id number]          PATIENT TYPE:  INP   LOCATION:  A320                          FACILITY:  [facility]   PHYSICIAN:  Dirk Dress. Katrinka Blazing, M.D.   DATE OF BIRTH:  1980-06-22   DATE OF PROCEDURE:  05/14/2004  DATE OF DISCHARGE:                                 OPERATIVE REPORT   POSTOPERATIVE DIAGNOSIS:  Acute appendicitis   PROCEDURE:  Laparoscopic appendectomy.   SURGEON:  Elpidio Anis, M.D.   DESCRIPTION:  Under general anesthesia, the patient's abdomen was prepped  and draped in the sterile field.  A supraumbilical incision was made and  Veress needle was inserted uneventfully.  Abdomen was insufflated with 3 L  of CO2.  Using a Visiport guide, the 10 mm port was placed.  The laparoscope  scope was placed uneventfully.  There was inflammation in the right gutter  with multiple loops of small bowel adherent to the wall of the right gutter.  This inflammation extended up into the subhepatic region.  Under videoscopic  guidance a 5-mm port was placed in the superior pubic midline.  Using mostly  blunt dissection, the thick adhesions to the small bowel between the small  bowel and the parietal peritoneum were dissected.  This was continued up to  the right upper quadrant.  In the right upper quadrant a retrocecal appendix  was found.  It was dissected free using sharp and blunt dissection.  It was  grasped with a Babcock clamp.  It was dissected back to the base of the  appendix.  This was confirmed by the cecum and the ileocecal junction.  The  base of the appendix was resected.  The mesoappendix was initially transected using a vascular Auto-Suture.  The  initial fire was adequate.  Follow-up fire was not adequate and did not transect or staples the tissue.  It was therefore decided to try another one on the base of the appendix.  He  this was also using an Auto-Suture Suture stapler,  Endo-GIA.  This too  failed for fire and failed to transect the base of the appendix.  I  therefore switched to a [employer] Endo-GIA stapler.  With the Ethicon stapler,  the base was stapled and transected without difficulty.  Full view of the  staple line at the base of the appendix revealed good placement of staples,  no evidence of bleeding.  Pictures of the base of the appendix and the  mesoappendix were taken after two misfires of the Auto-Suture stapler.  This  was done to show to the company representative.  The appendix was placed in  an EndoCatch device and retrieved.  The area of dissection was irrigated.  The small bowel was checked and there was no evidence of violation of the  wall of the bowel.  A JP drain was placed in the 12 mm port of the left  lower quadrant and was positioned in the subhepatic space and down into the  right lower quadrant.  It was brought out through the suprapubic midline  incision..  CO2 was allowed  to escape from the abdomen and the ports were  removed.  The JP drain was secured with 3-0 nylon.  The fascia of the  supraumbilical port and the left lower quadrant port was closed with  interrupted 0 Vicryl.  The skin was closed with staples . The patient  tolerated the procedure well.  He was awakened from anesthesia.  Dressing  was placed.  He was transferred to a bed and taken to the postanesthetic  care unit for monitoring.      LCS/MEDQ  D:  05/14/2004  T:  05/15/2004  Job:  [id number]

## 2010-08-25 NOTE — Discharge Summary (Signed)
Blake [name], Blake [name]             ACCOUNT NO.:  [id number]   MEDICAL RECORD NO.:  [id number]          PATIENT TYPE:  INP   LOCATION:  A320                          FACILITY:  [facility]   PHYSICIAN:  Dirk Dress. Katrinka Blazing, M.D.   DATE OF BIRTH:  08-25-1980   DATE OF ADMISSION:  05/14/2004  DATE OF DISCHARGE:  02/07/2006LH                                 DISCHARGE SUMMARY   DISCHARGE DIAGNOSIS:  Acute appendicitis.   PROCEDURE:  Laparoscopic appendectomy on February 5.   DISPOSITION:  The patient discharged to home in stable satisfactory  condition.   DISCHARGE MEDICATIONS:  1.  Levaquin 750 mg q.d.  2.  Dilaudid 4 mg q.4h. p.r.n.  3.  Phenergan 25 mg q.4h. p.r.n.   The patient is scheduled to be seen in the office on February 20.   SUMMARY:  A 30 year old male with history of acute onset of right  paraumbilical abdominal pain with nausea in the early morning of the day of  admission. The pain awakened him from sleep and intensified during the  morning. It radiated through to this back. The patient was seen in the  emergency room and had a CT of his abdomen which showed a retrocecal  appendix with appendicitis. Past history is unremarkable otherwise. Physical  examination was basically benign except for mild tachycardia. He did have  right flank and right lower quadrant tenderness. White count was 20,700. He  was mildly volume contracted with hemoglobin of 18.5 and hematocrit of 51.6.  Urinalysis was normal. The patient was started on Cipro and Ancef and  scheduled for surgery. Laparoscopic appendectomy was done on the day of  admission. He did exceptionally well. He was asymptomatic the following  morning, and he was discharged on the evening of the first postoperative day  in satisfactory condition.      LCS/MEDQ  D:  06/25/2004  T:  06/26/2004  Job:  [id number]

## 2010-08-25 NOTE — Op Note (Signed)
Blake [name], Blake [name]             ACCOUNT NO.:  [id number]   MEDICAL RECORD NO.:  [id number]          PATIENT TYPE:  INP   LOCATION:  0098                         FACILITY:  [facility]   PHYSICIAN:  Sandria Bales. Ezzard Standing, M.D.  DATE OF BIRTH:  1981/01/12   DATE OF PROCEDURE:  DATE OF DISCHARGE:                                 OPERATIVE REPORT   PREOPERATIVE DIAGNOSIS:  Appendicitis.   POSTOPERATIVE DIAGNOSIS:  Appendicitis, retrocecal (second appendectomy).   PROCEDURE:  Laparoscopic appendectomy.   SURGEON:  Sandria Bales. Ezzard Standing, M.D.   ANESTHESIA:  General endotracheal.   ESTIMATED BLOOD LOSS:  Minimal.   PROCEDURE:  Blake [name] is a 30 year old white male who had a laparoscopic  appendectomy by Dr. Elpidio Anis in February of 2006.  He presented 24 hours  ago with abdominal pain and presented to the [facility] ER where he  underwent a CT scan, which suggested he had another bout of appendicitis,  probably from a stump of appendix.   The patient now comes for attempted laparoscopic appendectomy.  Potential  complications of appendectomy are explained to the patient.  Potential  complications include but are not limited to bleeding, infection, bowel  injury, possibility of open surgery.   OPERATIVE NOTE:  The patient was placed in a supine position and given a general endotracheal  anesthetic.  His abdomen was prepped with Betadine solution and a Foley  catheter was placed.  He was given 2 grams of cefotaxime at the initiation  of the procedure.  The CT scan showed the cecum was in the right upper  quadrant.  The upper part of the appendix, which had clips around it,  touched the right lobe of the liver.   So, I made an umbilical trocar with a 0-degree 10-mm scope insert through a  12-mm Hassan trocar and secured with a #0 Vicryl suture.  I then I placed a  5-mm in the subxiphoid location, a 10-mm in the right lower quadrant, and  then a second 5-mm lateral right lower quadrant.   I  dissected out the cecum, and again the bottom of the cecum was at the  level or higher of the umbilicus.  I mobilized the cecum medially.  Also,  the terminal ileum was stuck along the right lower quadrant, which I did not  take down.  Upon mobilizing the cecum, I could find the base of the  appendix.  I was able to get around the base of the appendix.  I got an Endo-  GIA 45 mm stapler across the base of the appendix, this was a white load,  and fired this.   I then was able to lift up the appendiceal stump and dissect the remainder  of this off the undersurface of the right colon.  I used both blunt  dissection and harmonic dissection.  The remaining stump was probably 6-7 cm  long.  I also ran into some of his clips that he used before.  At no time  did I think I had injured the right colon or bowel.  I thought I had a clean  dissection of the appendix.  I actually identified the appendiceal artery  which I used the harmonic scalpel to divide.   I then irrigated the abdomen out and delivered the appendix through the  umbilicus with an Endocatch bag, and I sent it to pathology.  I then removed  the trocar in turn.  There was no bleeding at the trocar site.  The  umbilical port was closed with a #0 Vicryl suture.  The skin at each port  was closed with a 5-0 Monocryl suture.      Sandria Bales. Ezzard Standing, M.D.  Electronically Signed     DHN/MEDQ  D:  10/26/2005  T:  10/26/2005  Job:  [id number]

## 2011-06-09 ENCOUNTER — Emergency Department (HOSPITAL_COMMUNITY): Payer: 59

## 2011-06-09 ENCOUNTER — Emergency Department (HOSPITAL_COMMUNITY)
Admission: EM | Admit: 2011-06-09 | Discharge: 2011-06-09 | Disposition: A | Discharge status: Home / Self Care | Payer: 59 | Attending: Emergency Medicine | Admitting: Emergency Medicine

## 2011-06-09 ENCOUNTER — Encounter (HOSPITAL_COMMUNITY): Payer: Self-pay | Admitting: *Deleted

## 2011-06-09 ENCOUNTER — Other Ambulatory Visit: Payer: Self-pay

## 2011-06-09 DIAGNOSIS — IMO0002 Reserved for concepts with insufficient information to code with codable children: Secondary | ICD-10-CM | POA: Insufficient documentation

## 2011-06-09 DIAGNOSIS — R209 Unspecified disturbances of skin sensation: Secondary | ICD-10-CM | POA: Insufficient documentation

## 2011-06-09 DIAGNOSIS — M792 Neuralgia and neuritis, unspecified: Secondary | ICD-10-CM

## 2011-06-09 DIAGNOSIS — R42 Dizziness and giddiness: Secondary | ICD-10-CM | POA: Insufficient documentation

## 2011-06-09 DIAGNOSIS — R079 Chest pain, unspecified: Secondary | ICD-10-CM | POA: Insufficient documentation

## 2011-06-09 HISTORY — DX: Pure hypercholesterolemia, unspecified: E78.00

## 2011-06-09 LAB — DIFFERENTIAL
Basophils Absolute: 0 10*3/uL (ref 0.0–0.1)
Basophils Relative: 1 % (ref 0–1)
Eosinophils Absolute: 0.3 10*3/uL (ref 0.0–0.7)
Monocytes Absolute: 0.6 10*3/uL (ref 0.1–1.0)
Monocytes Relative: 8 % (ref 3–12)
Neutrophils Relative %: 63 % (ref 43–77)

## 2011-06-09 LAB — CBC
Hemoglobin: 14.7 g/dL (ref 13.0–17.0)
MCH: 29.9 pg (ref 26.0–34.0)
MCHC: 34.1 g/dL (ref 30.0–36.0)
RDW: 13.2 % (ref 11.5–15.5)

## 2011-06-09 LAB — BASIC METABOLIC PANEL
BUN: 16 mg/dL (ref 6–23)
Creatinine, Ser: 1.12 mg/dL (ref 0.50–1.35)
GFR calc Af Amer: >90 mL/min (ref >90)
GFR calc non Af Amer: 88 mL/min — ABNORMAL LOW (ref >90)
Potassium: 3.6 mEq/L (ref 3.5–5.1)

## 2011-06-09 MED ORDER — IBUPROFEN 800 MG PO TABS
800.0000 mg | ORAL_TABLET | Freq: Once | ORAL | Status: AC
  Administered 2011-06-09: 800 mg via ORAL
  Filled 2011-06-09: qty 1

## 2011-06-09 NOTE — Discharge Instructions (Signed)
USE 600 MG IBUPROFEN THREE TIMES A DAY FOR THE NEXT 5-7 DAYS. FOLLOW UP WITH DR. Gerda Diss. IF NEEDED, HE CAN ORDER AN OUTPATIENT MRI AND REFER YOU TO A NEUROSURGEON.

## 2011-06-09 NOTE — ED Provider Notes (Addendum)
History  Scribed for [employer]. Colon Branch, MD, the patient was seen in room [facility]. This chart was scribed by Candelaria Stagers. The patient's care started at 9:51 AM    CSN: [id number]  Arrival date & time 06/09/11  4098   First MD Initiated Contact with Patient 06/09/11 [phone]      Chief Complaint  Patient presents with  . Chest Pain     Patient is a 31 y.o. male presenting with chest pain. The history is provided by the patient.  Chest Pain The chest pain began yesterday. Chest pain occurs constantly. The chest pain is unchanged. The quality of the pain is described as burning. The pain does not radiate.  Associated symptoms include numbness (left arm).  Pertinent negatives for associated symptoms include no weakness. He tried nothing for the symptoms.    Blake [name] is a 31 y.o. male who presents to the Emergency Department complaining of a burning chest pain on the left side that started yesterday.  Pt is also experiencing numbness and heaviness in his left arm, hand, and fingers, in particular his index finger and thumb.  He states yesterday he experienced an episode of dizziness followed by left arm numbness and another episode this morning followed by burning chest pain as well, each lasting several hours.  He is not experiencing any weakness or inability to use the left arm and denies any injury or heavy lifting or pulling/pushing lately.  The pain does not radiate to the neck or shoulder area.  He has a h/o high cholesterol.      Past Medical History  Diagnosis Date  . High blood cholesterol level     Past Surgical History  Procedure Date  . Appendectomy   . Cholecystectomy   . Abdominal surgery     History reviewed. No pertinent family history.  History  Substance Use Topics  . Smoking status: Former Games developer  . Smokeless tobacco: Not on file  . Alcohol Use: Yes      Review of Systems  Cardiovascular: Positive for chest pain.  Musculoskeletal: Negative for  back pain and arthralgias.  Neurological: Positive for light-headedness and numbness (left arm). Negative for facial asymmetry and weakness.  All other systems reviewed and are negative.    Allergies  Penicillins  Home Medications  No current outpatient prescriptions on file.  BP 158/91  Temp(Src) 98.5 F (36.9 C) (Oral)  Resp 16  Ht 5\' 11"  (1.803 m)  Wt 250 lb (113.399 kg)  BMI 34.87 kg/m2  SpO2 99%  Physical Exam  Nursing note and vitals reviewed. Constitutional: He is oriented to person, place, and time. He appears well-developed and well-nourished. No distress.  HENT:  Head: Normocephalic and atraumatic.  Eyes: Conjunctivae and EOM are normal. Right eye exhibits no discharge. Left eye exhibits no discharge.  Neck: Normal range of motion. Neck supple.  Cardiovascular: Normal rate, regular rhythm and normal heart sounds.  Exam reveals no gallop and no friction rub.   No murmur heard. Pulmonary/Chest: Effort normal. No respiratory distress.  Musculoskeletal: Normal range of motion. He exhibits no edema and no tenderness.  Neurological: He is alert and oriented to person, place, and time.       No facial asymmetry. Normal strength.  No pronator drift.  Normal sensation of left arm.  Skin: Skin is warm and dry. He is not diaphoretic.  Psychiatric: He has a normal mood and affect. His behavior is normal.    ED Course  Procedures  DIAGNOSTIC STUDIES: Oxygen Saturation is 99% on room air, normal by my interpretation.    COORDINATION OF CARE:  9:29 AM Ordered: DG Chest 2 View; CBC ; Differential ; Basic metabolic panel ; I-Stat tropoinin I cardiac marker ; ED EKG  Results for orders placed during the hospital encounter of 06/09/11 (from the past 24 hour(s))  CBC     Status: Normal   Collection Time   06/09/11  9:30 AM      Component Value Range   WBC 7.9  4.0 - 10.5 (K/uL)   RBC 4.92  4.22 - 5.81 (MIL/uL)   Hemoglobin 14.7  13.0 - 17.0 (g/dL)   HCT 29.5  62.1 - 30.8  (%)   MCV 87.6  78.0 - 100.0 (fL)   MCH 29.9  26.0 - 34.0 (pg)   MCHC 34.1  30.0 - 36.0 (g/dL)   RDW 65.7  84.6 - 96.2 (%)   Platelets 251  150 - 400 (K/uL)  DIFFERENTIAL     Status: Normal   Collection Time   06/09/11  9:30 AM      Component Value Range   Neutrophils Relative 63  43 - 77 (%)   Neutro Abs 5.0  1.7 - 7.7 (K/uL)   Lymphocytes Relative 25  12 - 46 (%)   Lymphs Abs 2.0  0.7 - 4.0 (K/uL)   Monocytes Relative 8  3 - 12 (%)   Monocytes Absolute 0.6  0.1 - 1.0 (K/uL)   Eosinophils Relative 4  0 - 5 (%)   Eosinophils Absolute 0.3  0.0 - 0.7 (K/uL)   Basophils Relative 1  0 - 1 (%)   Basophils Absolute 0.0  0.0 - 0.1 (K/uL)  BASIC METABOLIC PANEL     Status: Abnormal   Collection Time   06/09/11  9:30 AM      Component Value Range   Sodium 139  135 - 145 (mEq/L)   Potassium 3.6  3.5 - 5.1 (mEq/L)   Chloride 104  96 - 112 (mEq/L)   CO2 28  19 - 32 (mEq/L)   Glucose, Bld 99  70 - 99 (mg/dL)   BUN 16  6 - 23 (mg/dL)   Creatinine, Ser 9.52  0.50 - 1.35 (mg/dL)   Calcium 84.1  8.4 - 10.5 (mg/dL)   GFR calc non Af Amer 88 (*) >90 (mL/min)   GFR calc Af Amer >90  >90 (mL/min)   Results for orders placed during the hospital encounter of 06/09/11  CBC      Component Value Range   WBC 7.9  4.0 - 10.5 (K/uL)   RBC 4.92  4.22 - 5.81 (MIL/uL)   Hemoglobin 14.7  13.0 - 17.0 (g/dL)   HCT 32.4  40.1 - 02.7 (%)   MCV 87.6  78.0 - 100.0 (fL)   MCH 29.9  26.0 - 34.0 (pg)   MCHC 34.1  30.0 - 36.0 (g/dL)   RDW 25.3  66.4 - 40.3 (%)   Platelets 251  150 - 400 (K/uL)  DIFFERENTIAL      Component Value Range   Neutrophils Relative 63  43 - 77 (%)   Neutro Abs 5.0  1.7 - 7.7 (K/uL)   Lymphocytes Relative 25  12 - 46 (%)   Lymphs Abs 2.0  0.7 - 4.0 (K/uL)   Monocytes Relative 8  3 - 12 (%)   Monocytes Absolute 0.6  0.1 - 1.0 (K/uL)   Eosinophils Relative 4  0 - 5 (%)  Eosinophils Absolute 0.3  0.0 - 0.7 (K/uL)   Basophils Relative 1  0 - 1 (%)   Basophils Absolute 0.0  0.0 - 0.1  (K/uL)  BASIC METABOLIC PANEL      Component Value Range   Sodium 139  135 - 145 (mEq/L)   Potassium 3.6  3.5 - 5.1 (mEq/L)   Chloride 104  96 - 112 (mEq/L)   CO2 28  19 - 32 (mEq/L)   Glucose, Bld 99  70 - 99 (mg/dL)   BUN 16  6 - 23 (mg/dL)   Creatinine, Ser 6.57  0.50 - 1.35 (mg/dL)   Calcium 84.6  8.4 - 10.5 (mg/dL)   GFR calc non Af Amer 88 (*) >90 (mL/min)   GFR calc Af Amer >90  >90 (mL/min)     Date: 06/09/2011  0916  Rate:76  Rhythm: normal sinus rhythm and sinus arrhythmia  QRS Axis: normal  Intervals: normal  ST/T Wave abnormalities: normal  Conduction Disutrbances:none  Narrative Interpretation:   Old EKG Reviewed: none available  Dg Chest 2 View  06/09/2011  *RADIOLOGY REPORT*  Clinical Data: Chest pain  CHEST - 2 VIEW  Comparison: Plain film 04/30/2008  Findings: Exam is lordotic.  Normal cardiac silhouette.  No effusion, infiltrate, or pneumothorax.  IMPRESSION: No acute cardiopulmonary process.  Original Report Authenticated By: Genevive Bi, M.D.   Ct Head Wo Contrast  06/09/2011  *RADIOLOGY REPORT*  Clinical Data:  31 year old male with dizziness and left arm and and finger numbness.  CT HEAD WITHOUT CONTRAST CT CERVICAL SPINE WITHOUT CONTRAST  Technique:  Multidetector CT imaging of the head and cervical spine was performed following the standard protocol without intravenous contrast.  Multiplanar CT image reconstructions of the cervical spine were also generated.  Comparison:  None  CT HEAD  Findings: No acute intracranial abnormalities are identified, including mass lesion or mass effect, hydrocephalus, extra-axial fluid collection, midline shift, hemorrhage, or acute infarction.  The visualized bony calvarium is unremarkable.  IMPRESSION: Unremarkable noncontrast head CT.  CT CERVICAL SPINE  Findings: Straightening of the normal cervical lordosis is identified. There is no evidence of fracture, subluxation or prevertebral soft tissue swelling.  Moderate left  paracentral spondylosis at C5-C6 is noted.  The posterior bony spurring may contact the anterior left cord at this level.  A mild broad-based disc bulge at C5-C6 is present.  It is difficult to further evaluate this region secondary to streak artifact.  No suspicious focal bony lesions are present. The disc spaces are otherwise maintained. The soft tissue structures are otherwise unremarkable.  IMPRESSION: Moderate left paracentral spondylosis at C5-C6 with posterior spurring appearing to contact the anterior left cord and may account for the patient's symptoms.  MRI may be helpful for further evaluation as streak artifact in this area limits sensitivity.  No evidence of acute bony abnormality.  Original Report Authenticated By: Rosendo Gros, M.D.   Ct Cervical Spine Wo Contrast  06/09/2011  *RADIOLOGY REPORT*  Clinical Data:  31 year old male with dizziness and left arm and and finger numbness.  CT HEAD WITHOUT CONTRAST CT CERVICAL SPINE WITHOUT CONTRAST  Technique:  Multidetector CT imaging of the head and cervical spine was performed following the standard protocol without intravenous contrast.  Multiplanar CT image reconstructions of the cervical spine were also generated.  Comparison:  None  CT HEAD  Findings: No acute intracranial abnormalities are identified, including mass lesion or mass effect, hydrocephalus, extra-axial fluid collection, midline shift, hemorrhage, or acute infarction.  The  visualized bony calvarium is unremarkable.  IMPRESSION: Unremarkable noncontrast head CT.  CT CERVICAL SPINE  Findings: Straightening of the normal cervical lordosis is identified. There is no evidence of fracture, subluxation or prevertebral soft tissue swelling.  Moderate left paracentral spondylosis at C5-C6 is noted.  The posterior bony spurring may contact the anterior left cord at this level.  A mild broad-based disc bulge at C5-C6 is present.  It is difficult to further evaluate this region secondary to streak  artifact.  No suspicious focal bony lesions are present. The disc spaces are otherwise maintained. The soft tissue structures are otherwise unremarkable.  IMPRESSION: Moderate left paracentral spondylosis at C5-C6 with posterior spurring appearing to contact the anterior left cord and may account for the patient's symptoms.  MRI may be helpful for further evaluation as streak artifact in this area limits sensitivity.  No evidence of acute bony abnormality.  Original Report Authenticated By: Rosendo Gros, M.D.    MDM  Patient with left arm and hand numbness. Labs unremarkable. CT scan with evidence of spur and possible explanation for symptoms. Reviewed results with patient and his wife. They will follow up with PCP and if needed, follow up with neurosurgery. Next w /u step would be an MRI.Pt feels improved after observation and/or treatment in ED.Pt stable in ED with no significant deterioration in condition.The patient appears reasonably screened and/or stabilized for discharge and I doubt any other medical condition or other [facility] requiring further screening, evaluation, or treatment in the ED at this time prior to discharge.  I personally performed the services described in this documentation, which was scribed in my presence. The recorded information has been reviewed and considered.  MDM Reviewed: nursing note and vitals Interpretation: labs, ECG, CT scan and x-ray          Nicoletta Dress. Colon Branch, MD 06/09/11 1224  Nicoletta Dress. Colon Branch, MD 06/09/11 1456

## 2011-06-09 NOTE — ED Notes (Signed)
Pt c/o chest "burning" since 8 am this morning. Pt also c/o numbness and heaviness in his left arm since yesterday. Pt had an episode of chest pain yesterday that lasted approximately 2 hours ago. C/o shortness of breath and lightheadedness also.

## 2012-08-01 ENCOUNTER — Other Ambulatory Visit: Payer: Self-pay | Admitting: Family Medicine

## 2012-08-28 ENCOUNTER — Other Ambulatory Visit: Payer: Self-pay | Admitting: Family Medicine

## 2012-10-06 ENCOUNTER — Encounter: Payer: Self-pay | Admitting: *Deleted

## 2012-10-14 ENCOUNTER — Ambulatory Visit (INDEPENDENT_AMBULATORY_CARE_PROVIDER_SITE_OTHER): Payer: BC Managed Care – PPO | Admitting: Family Medicine

## 2012-10-14 ENCOUNTER — Encounter: Payer: Self-pay | Admitting: Family Medicine

## 2012-10-14 VITALS — BP 156/80 | HR 70 | Wt 255.8 lb

## 2012-10-14 DIAGNOSIS — E785 Hyperlipidemia, unspecified: Secondary | ICD-10-CM

## 2012-10-14 DIAGNOSIS — Z0000 Encounter for general adult medical examination without abnormal findings: Secondary | ICD-10-CM

## 2012-10-14 NOTE — Patient Instructions (Signed)
Check BP 2 to 4 times a month May fax [phone]) me readings or send by MyChart   Increase walking Try to reduce weight by 5 to 10 %.  Send me the sleep survey that you and Raynelle Fanning fill out  DASH Diet The DASH diet stands for "Dietary Approaches to Stop Hypertension." It is a healthy eating plan that has been shown to reduce high blood pressure (hypertension) in as little as 14 days, while also possibly providing other significant health benefits. These other health benefits include reducing the risk of breast cancer after menopause and reducing the risk of type 2 diabetes, heart disease, colon cancer, and stroke. Health benefits also include weight loss and slowing kidney failure in patients with chronic kidney disease.  DIET GUIDELINES  Limit salt (sodium). Your diet should contain less than 1500 mg of sodium daily.  Limit refined or processed carbohydrates. Your diet should include mostly whole grains. Desserts and added sugars should be used sparingly.  Include small amounts of heart-healthy fats. These types of fats include nuts, oils, and tub margarine. Limit saturated and trans fats. These fats have been shown to be harmful in the body. CHOOSING FOODS  The following food groups are based on a 2000 calorie diet. See your Registered Dietitian for individual calorie needs. Grains and Grain Products (6 to 8 servings daily)  Eat More Often: Whole-wheat bread, brown rice, whole-grain or wheat pasta, quinoa, popcorn without added fat or salt (air popped).  Eat Less Often: White bread, white pasta, white rice, cornbread. Vegetables (4 to 5 servings daily)  Eat More Often: Fresh, frozen, and canned vegetables. Vegetables may be raw, steamed, roasted, or grilled with a minimal amount of fat.  Eat Less Often/Avoid: Creamed or fried vegetables. Vegetables in a cheese sauce. Fruit (4 to 5 servings daily)  Eat More Often: All fresh, canned (in natural juice), or frozen fruits. Dried fruits  without added sugar. One hundred percent fruit juice ( cup [237 mL] daily).  Eat Less Often: Dried fruits with added sugar. Canned fruit in light or heavy syrup. [employer], Fish, and Poultry (2 servings or less daily. One serving is 3 to 4 oz [85-114 g]).  Eat More Often: Ninety percent or leaner ground beef, tenderloin, sirloin. Round cuts of beef, chicken breast, [place] breast. All fish. Grill, bake, or broil your meat. Nothing should be fried.  Eat Less Often/Avoid: Fatty cuts of meat, [place], or chicken leg, thigh, or wing. Fried cuts of meat or fish. Dairy (2 to 3 servings)  Eat More Often: Low-fat or fat-free milk, low-fat plain or light yogurt, reduced-fat or part-skim cheese.  Eat Less Often/Avoid: Milk (whole, 2%).Whole milk yogurt. Full-fat cheeses. Nuts, Seeds, and Legumes (4 to 5 servings per week)  Eat More Often: All without added salt.  Eat Less Often/Avoid: Salted nuts and seeds, canned beans with added salt. Fats and Sweets (limited)  Eat More Often: Vegetable oils, tub margarines without trans fats, sugar-free gelatin. Mayonnaise and salad dressings.  Eat Less Often/Avoid: Coconut oils, palm oils, butter, stick margarine, cream, half and half, cookies, candy, pie. FOR MORE INFORMATION The Dash Diet Eating Plan: www.dashdiet.org Document Released: 03/15/2011 Document Revised: 06/18/2011 Document Reviewed: 03/15/2011 [employer] Patient Information 2014 [place], [place].

## 2012-10-14 NOTE — Progress Notes (Signed)
  Subjective:    Patient ID: Blake [name], male    DOB: 05/01/1980, 32 y.o.   MRN: [id number]  HPI Here for a check up. Concerns he has been snoring and not feeling rested when he wakes up.  Patient times states he is feeling tired during the day at times he is even gotten sleepy with driving. His wife states he snores at night and sometimes stops breathing. He has gained some weight he is hoping that losing weight will help correct this.  Has a history of mood issues but these are doing good on Wellbutrin. Patient also has history of slight cholesterol issues he knows he needs to eat better and try to exercise more.  No personal history of any premature heart disease. Patient does not smoke currently  Review of Systems    he denies shortness of breath chest tightness pressure pain or swelling in the legs Objective:   Physical Exam Blood pressure slightly elevated on recheck 148/84 lungs are clear no crackles heart is regular pulse normal abdomen soft extremities no edema skin warm dry throat normal patient moderately overweight       Assessment & Plan:  #1 obesity-she is encouraged to lose 5-10% of his body weight over the next few months followup within 3-6 months #2 patient to maintain a healthy diet keep his cholesterol and check #3 mood disorder-does well on Wellbutrin #4 patient is to fill out a survey regarding sleep with his wife and return this to [place] most likely will need sleep study #5 mild elevation of blood pressure.-DASH diet was recommended send [place] some readings in several weeks may need medication. #6 patient with history of knee pain bilateral he is to do cycling or elliptical to help his health

## 2012-10-20 ENCOUNTER — Telehealth: Payer: Self-pay | Admitting: Family Medicine

## 2012-10-20 DIAGNOSIS — R0683 Snoring: Secondary | ICD-10-CM

## 2012-10-20 LAB — LIPID PANEL
HDL: 31 mg/dL — ABNORMAL LOW (ref >39)
Triglycerides: 396 mg/dL — ABNORMAL HIGH (ref <150)

## 2012-10-20 LAB — BASIC METABOLIC PANEL
Calcium: 9.5 mg/dL (ref 8.4–10.5)
Creat: 1.25 mg/dL (ref 0.50–1.35)

## 2012-10-20 NOTE — Telephone Encounter (Signed)
Please inform the patient that his questionnaire puts him in a higher risk category for sleep apnea. Our staff will be helping to set up sleep study. We will contact him with this. If he has not heard anything within 10 days to let us know. Once the sleep study is completed it typically takes 3 weeks for [place] to hear the results. If he has not heard the results by that point in time then to call us.

## 2012-10-20 NOTE — Telephone Encounter (Signed)
Patient returned [place] Questionnaire for your review.  Thanks

## 2012-10-20 NOTE — Telephone Encounter (Signed)
Patient will need sleep study please notify the patient of this typically takes a while to set this up plus also takes 3 weeks to hear the results. Our staff will work with him on this.

## 2012-10-20 NOTE — Telephone Encounter (Signed)
Discussed with patient. Order for referral for sleep study put in and sent to [facility].

## 2012-10-21 ENCOUNTER — Encounter: Payer: Self-pay | Admitting: Family Medicine

## 2012-10-28 ENCOUNTER — Other Ambulatory Visit: Payer: Self-pay | Admitting: Family Medicine

## 2012-10-30 ENCOUNTER — Other Ambulatory Visit: Payer: Self-pay

## 2012-10-30 DIAGNOSIS — R0683 Snoring: Secondary | ICD-10-CM

## 2012-10-30 DIAGNOSIS — R5383 Other fatigue: Secondary | ICD-10-CM

## 2012-12-14 ENCOUNTER — Ambulatory Visit: Payer: BC Managed Care – PPO | Attending: Family Medicine | Admitting: Sleep Medicine

## 2012-12-14 DIAGNOSIS — R0683 Snoring: Secondary | ICD-10-CM

## 2012-12-14 DIAGNOSIS — G4733 Obstructive sleep apnea (adult) (pediatric): Secondary | ICD-10-CM | POA: Insufficient documentation

## 2012-12-14 DIAGNOSIS — R5381 Other malaise: Secondary | ICD-10-CM

## 2012-12-15 ENCOUNTER — Encounter: Payer: BC Managed Care – PPO | Admitting: Family Medicine

## 2012-12-15 ENCOUNTER — Encounter: Payer: Self-pay | Admitting: Family Medicine

## 2012-12-15 ENCOUNTER — Ambulatory Visit (INDEPENDENT_AMBULATORY_CARE_PROVIDER_SITE_OTHER): Payer: BC Managed Care – PPO | Admitting: Family Medicine

## 2012-12-15 VITALS — BP 122/78 | Ht 71.0 in | Wt 247.6 lb

## 2012-12-15 DIAGNOSIS — Z23 Encounter for immunization: Secondary | ICD-10-CM

## 2012-12-15 DIAGNOSIS — Z0000 Encounter for general adult medical examination without abnormal findings: Secondary | ICD-10-CM

## 2012-12-15 DIAGNOSIS — E781 Pure hyperglyceridemia: Secondary | ICD-10-CM

## 2012-12-15 NOTE — Progress Notes (Signed)
  Subjective:    Patient ID: Blake [name], male    DOB: 1980-09-13, 32 y.o.   MRN: [id number]  HPI Patient is in today for a wellness exam he's quit smoking losing weight and is more active he feels better about how things are going.  He denies any chest tightness pressure pain shortness breath he is planning on being a Naval architect Review of Systems  Constitutional: Negative for fever, activity change and appetite change.  HENT: Negative for congestion, rhinorrhea and neck pain.   Eyes: Negative for discharge.  Respiratory: Negative for cough and wheezing.   Cardiovascular: Negative for chest pain.  Gastrointestinal: Negative for vomiting, abdominal pain and blood in stool.  Genitourinary: Negative for frequency and difficulty urinating.  Skin: Negative for rash.  Allergic/Immunologic: Negative for environmental allergies and food allergies.  Neurological: Negative for weakness and headaches.  Psychiatric/Behavioral: Negative for agitation.       Objective:   Physical Exam  Constitutional: He appears well-developed and well-nourished.  HENT:  Head: Normocephalic and atraumatic.  Right Ear: External ear normal.  Left Ear: External ear normal.  Nose: Nose normal.  Mouth/Throat: Oropharynx is clear and moist.  Eyes: EOM are normal. Pupils are equal, round, and reactive to light.  Neck: Normal range of motion. Neck supple. No thyromegaly present.  Cardiovascular: Normal rate, regular rhythm and normal heart sounds.   No murmur heard. Pulmonary/Chest: Effort normal and breath sounds normal. No respiratory distress. He has no wheezes.  Abdominal: Soft. Bowel sounds are normal. He exhibits no distension and no mass. There is no tenderness.  Genitourinary: Penis normal.  Musculoskeletal: Normal range of motion. He exhibits no edema.  Lymphadenopathy:    He has no cervical adenopathy.  Neurological: He is alert. He exhibits normal muscle tone.  Skin: Skin is warm and dry.  No erythema.  Psychiatric: He has a normal mood and affect. His behavior is normal. Judgment normal.          Assessment & Plan:  Wellness exam. Lab work indicated in October otherwise things going well will not begin colonoscopy and prostate exam Sal age 78 yearly physical recommended continued to exercise lose weight.

## 2012-12-20 NOTE — Procedures (Signed)
[facility] [name] A. Gerilyn Pilgrim, MD     [email or web address]        Blake [name], Blake [name]             ACCOUNT NO.:  [id number]  MEDICAL RECORD NO.:  [id number]          PATIENT TYPE:  OUT  LOCATION:  SLEEP LAB                     FACILITY:  [facility]  PHYSICIAN:  Seth Higginbotham A. Gerilyn Pilgrim, M.D. DATE OF BIRTH:  06/20/1980  DATE OF STUDY:  12/14/2012                           NOCTURNAL POLYSOMNOGRAM  REFERRING PHYSICIAN:  Scott A. Gerda Diss, MD    INDICATION:  A 32 year old, who presents with fatigue, snoring, and obesity.   MEDICATIONS:  Wellbutrin,  EPWORTH SLEEPINESS SCALE:  7.  ARCHITECTURAL SUMMARY:  This is a split night recording with initial portion being a diagnostic and the second portion a titration recording. The total recording time is 422 minutes.  Sleep efficiency 88%, sleep latency 16 minutes.  REM latency 120 minutes.  Stage N1 5%, N2 50%, N3 22% and REM sleep 23%.  RESPIRATORY SUMMARY:  Baseline oxygen saturation is 98, lowest saturation 88 during non-REM sleep.  Diagnostic AHI is 26 with mostly hypopneas observed.  The patient was placed on positive pressure size about 4 and increased to 7.  The patient did well on both 6 and 7. Pressure with resolution of events and good tolerance.  LIMB MOVEMENT SUMMARY:  PLM index 0.  ELECTROCARDIOGRAM SUMMARY:  Average heart rate is 66 with no significant dysrhythmias observed.  IMPRESSION:  Moderate obstructive sleep apnea syndrome, which responds well to CPAP of 6 or 7.  I recommend the lowest effective pressure at 6.  Thanks for this referral.     [name] A. Gerilyn Pilgrim, M.D.    KAD/MEDQ  D:  12/20/2012 13:36:25  T:  12/20/2012 13:48:36  Job:  [id number]

## 2013-01-07 DIAGNOSIS — G4730 Sleep apnea, unspecified: Secondary | ICD-10-CM

## 2013-01-07 HISTORY — DX: Sleep apnea, unspecified: G47.30

## 2013-01-26 ENCOUNTER — Encounter: Payer: Self-pay | Admitting: Family Medicine

## 2013-01-26 ENCOUNTER — Ambulatory Visit (INDEPENDENT_AMBULATORY_CARE_PROVIDER_SITE_OTHER): Payer: BC Managed Care – PPO | Admitting: Family Medicine

## 2013-01-26 VITALS — BP 130/94 | Temp 97.8°F | Ht 71.0 in | Wt 245.8 lb

## 2013-01-26 DIAGNOSIS — J019 Acute sinusitis, unspecified: Secondary | ICD-10-CM

## 2013-01-26 MED ORDER — LEVOFLOXACIN 500 MG PO TABS: 500.0000 mg | ORAL_TABLET | Freq: Every day | ORAL | Status: DC

## 2013-01-26 MED ORDER — LEVOFLOXACIN 500 MG PO TABS: 500.0000 mg | ORAL_TABLET | Freq: Every day | ORAL | Status: AC

## 2013-01-26 NOTE — Patient Instructions (Signed)
DASH Diet  The DASH diet stands for "Dietary Approaches to Stop Hypertension." It is a healthy eating plan that has been shown to reduce high blood pressure (hypertension) in as little as 14 days, while also possibly providing other significant health benefits. These other health benefits include reducing the risk of breast cancer after menopause and reducing the risk of type 2 diabetes, heart disease, colon cancer, and stroke. Health benefits also include weight loss and slowing kidney failure in patients with chronic kidney disease.   DIET GUIDELINES  · Limit salt (sodium). Your diet should contain less than 1500 mg of sodium daily.  · Limit refined or processed carbohydrates. Your diet should include mostly whole grains. Desserts and added sugars should be used sparingly.  · Include small amounts of heart-healthy fats. These types of fats include nuts, oils, and tub margarine. Limit saturated and trans fats. These fats have been shown to be harmful in the body.  CHOOSING FOODS   The following food groups are based on a 2000 calorie diet. See your Registered Dietitian for individual calorie needs.  Grains and Grain Products (6 to 8 servings daily)  · Eat More Often: Whole-wheat bread, brown rice, whole-grain or wheat pasta, quinoa, popcorn without added fat or salt (air popped).  · Eat Less Often: White bread, white pasta, white rice, cornbread.  Vegetables (4 to 5 servings daily)  · Eat More Often: Fresh, frozen, and canned vegetables. Vegetables may be raw, steamed, roasted, or grilled with a minimal amount of fat.  · Eat Less Often/Avoid: Creamed or fried vegetables. Vegetables in a cheese sauce.  Fruit (4 to 5 servings daily)  · Eat More Often: All fresh, canned (in natural juice), or frozen fruits. Dried fruits without added sugar. One hundred percent fruit juice (½ cup [237 mL] daily).  · Eat Less Often: Dried fruits with added sugar. Canned fruit in light or heavy syrup.  Lean Meats, Fish, and Poultry (2  servings or less daily. One serving is 3 to 4 oz [85-114 g]).  · Eat More Often: Ninety percent or leaner ground beef, tenderloin, sirloin. Round cuts of beef, chicken breast, turkey breast. All fish. Grill, bake, or broil your meat. Nothing should be fried.  · Eat Less Often/Avoid: Fatty cuts of meat, turkey, or chicken leg, thigh, or wing. Fried cuts of meat or fish.  Dairy (2 to 3 servings)  · Eat More Often: Low-fat or fat-free milk, low-fat plain or light yogurt, reduced-fat or part-skim cheese.  · Eat Less Often/Avoid: Milk (whole, 2%). Whole milk yogurt. Full-fat cheeses.  Nuts, Seeds, and Legumes (4 to 5 servings per week)  · Eat More Often: All without added salt.  · Eat Less Often/Avoid: Salted nuts and seeds, canned beans with added salt.  Fats and Sweets (limited)  · Eat More Often: Vegetable oils, tub margarines without trans fats, sugar-free gelatin. Mayonnaise and salad dressings.  · Eat Less Often/Avoid: Coconut oils, palm oils, butter, stick margarine, cream, half and half, cookies, candy, pie.  FOR MORE INFORMATION  The Dash Diet Eating Plan: www.dashdiet.org  Document Released: 03/15/2011 Document Revised: 06/18/2011 Document Reviewed: 03/15/2011  [employer]® Patient Information ©2014 [employer].

## 2013-01-26 NOTE — Progress Notes (Signed)
  Subjective:    Patient ID: Blake [name], male    DOB: Sep 03, 1980, 32 y.o.   MRN: [id number]  Sore Throat  This is a new problem. The current episode started 1 to 4 weeks ago. The problem has been gradually worsening. Maximum temperature: Unmeasured. Associated symptoms include congestion, coughing, diarrhea, headaches and swollen glands. Associated symptoms comments: Also c/o eyes being closed shut in the mornings with discharge and they are red as well. . He has tried acetaminophen and NSAIDs for the symptoms. The treatment provided mild relief.  took turn for the worse on Su nday, eye crusting also Sweats yesterday,chills also No whheeze No nausea /vomit Head colds in the household   Review of Systems  HENT: Positive for congestion.   Respiratory: Positive for cough.   Gastrointestinal: Positive for diarrhea.  Neurological: Positive for headaches.       Objective:   Physical Exam  Constitutional: He appears well-developed.  HENT:  Head: Normocephalic.  Right Ear: External ear normal.  Neck: Normal range of motion.  Cardiovascular: Normal rate, regular rhythm and normal heart sounds.   No murmur heard. Pulmonary/Chest: Effort normal and breath sounds normal. He has no wheezes. He exhibits no tenderness.  Lymphadenopathy:    He has no cervical adenopathy.  Skin: Skin is warm and dry.   130 /88       Assessment & Plan:   viral syndrome that turned into a sinusitis. Antibiotics prescribed. Warning signs discussed. Followup if ongoing trouble. Encourage patient to use his CPAP machine.

## 2013-04-24 ENCOUNTER — Other Ambulatory Visit: Payer: Self-pay | Admitting: Family Medicine

## 2013-10-27 ENCOUNTER — Encounter: Payer: Self-pay | Admitting: Family Medicine

## 2013-10-27 ENCOUNTER — Ambulatory Visit (INDEPENDENT_AMBULATORY_CARE_PROVIDER_SITE_OTHER): Payer: BC Managed Care – PPO | Admitting: Family Medicine

## 2013-10-27 VITALS — BP 128/82 | Ht 71.0 in | Wt 250.4 lb

## 2013-10-27 DIAGNOSIS — F439 Reaction to severe stress, unspecified: Secondary | ICD-10-CM

## 2013-10-27 DIAGNOSIS — K7689 Other specified diseases of liver: Secondary | ICD-10-CM

## 2013-10-27 DIAGNOSIS — Z639 Problem related to primary support group, unspecified: Secondary | ICD-10-CM

## 2013-10-27 DIAGNOSIS — E781 Pure hyperglyceridemia: Secondary | ICD-10-CM

## 2013-10-27 DIAGNOSIS — G4733 Obstructive sleep apnea (adult) (pediatric): Secondary | ICD-10-CM

## 2013-10-27 DIAGNOSIS — K760 Fatty (change of) liver, not elsewhere classified: Secondary | ICD-10-CM | POA: Insufficient documentation

## 2013-10-27 MED ORDER — VARENICLINE TARTRATE 1 MG PO TABS: 1.0000 mg | ORAL_TABLET | Freq: Two times a day (BID) | ORAL | Status: DC

## 2013-10-27 MED ORDER — FENOFIBRATE 160 MG PO TABS: ORAL_TABLET | ORAL | Status: DC

## 2013-10-27 MED ORDER — VARENICLINE TARTRATE 0.5 MG X 11 & 1 MG X 42 PO MISC
ORAL | Status: DC
Start: 2013-10-27 — End: 2014-03-22

## 2013-10-27 MED ORDER — CITALOPRAM HYDROBROMIDE 10 MG PO TABS: 10.0000 mg | ORAL_TABLET | Freq: Every day | ORAL | Status: DC

## 2013-10-27 NOTE — Progress Notes (Signed)
   Subjective:    Patient ID: Blake [name], male    DOB: 19-May-1980, 33 y.o.   MRN: [id number]  Nicotine Dependence Presents for initial visit. Preferred tobacco types include cigarettes. Preferred cigarette types include filtered. Preferred strength is light. Preferred cigarettes are menthol. Preferred brands include Marlboro. His urge triggers include company of smokers, meal time and stress. He smokes < 1/2 a pack of cigarettes per day. Past treatments include buproprion.   He relates he is trying to lose weight He does use a CPAP machine but he states he's not sure if it works well Has fatty liver is trying to lose weight Hypertriglyceridemia takes medicine tries to watch diet try to his weight.   Review of Systems  denies headaches nausea vomiting chest pain shortness of breath    Objective:   Physical Exam  Neck no masses lungs are clear no crackles heart is regular abdomen soft extremities no edema skin warm dry  Blood pressure borderline they will be better if he quit smoking    Assessment & Plan:  Nicotine dependence that she use Chantix over the next 12 weeks. Should help him quit. Discussed the importance of doing so. If depression symptoms with medicines stop medicine immediately followup  Sleep apnea will check with his provider if this is adequately functioning.  Fatty liver the importance of losing weight watching diet.  Hypertriglyceridemia take medication check lab work  25 minutes spent with patient  Significant stressors at home and at work recommend stress behavioral techniques plus also consider seeing a psychologist. Stop Wellbutrin. Use Celexa 10 mg daily. If any side effects stop medicine followup immediately.

## 2013-11-23 LAB — HEPATIC FUNCTION PANEL
ALBUMIN: 4.7 g/dL (ref 3.5–5.2)
ALK PHOS: 35 U/L — AB (ref 39–117)
ALT: 49 U/L (ref 0–53)
AST: 28 U/L (ref 0–37)
BILIRUBIN INDIRECT: 0.4 mg/dL (ref 0.2–1.2)
Bilirubin, Direct: 0.1 mg/dL (ref 0.0–0.3)
TOTAL PROTEIN: 7.1 g/dL (ref 6.0–8.3)
Total Bilirubin: 0.5 mg/dL (ref 0.2–1.2)

## 2013-11-23 LAB — BASIC METABOLIC PANEL
BUN: 10 mg/dL (ref 6–23)
CALCIUM: 9.5 mg/dL (ref 8.4–10.5)
CHLORIDE: 102 meq/L (ref 96–112)
CO2: 28 mEq/L (ref 19–32)
CREATININE: 1.08 mg/dL (ref 0.50–1.35)
Glucose, Bld: 77 mg/dL (ref 70–99)
Potassium: 4.4 mEq/L (ref 3.5–5.3)
Sodium: 140 mEq/L (ref 135–145)

## 2013-11-23 LAB — LIPID PANEL
CHOLESTEROL: 190 mg/dL (ref 0–200)
HDL: 36 mg/dL — AB (ref >39)
LDL Cholesterol: 96 mg/dL (ref 0–99)
TRIGLYCERIDES: 288 mg/dL — AB (ref <150)
Total CHOL/HDL Ratio: 5.3 Ratio
VLDL: 58 mg/dL — ABNORMAL HIGH (ref 0–40)

## 2013-11-24 NOTE — Progress Notes (Signed)
Patient notified and verbalized understanding of test results. Pt did not have any further questions. 

## 2014-03-22 ENCOUNTER — Encounter: Payer: Self-pay | Admitting: Family Medicine

## 2014-03-22 ENCOUNTER — Ambulatory Visit (INDEPENDENT_AMBULATORY_CARE_PROVIDER_SITE_OTHER): Payer: BC Managed Care – PPO | Admitting: Family Medicine

## 2014-03-22 VITALS — BP 128/88 | Temp 98.2°F | Ht 72.0 in | Wt 263.0 lb

## 2014-03-22 DIAGNOSIS — R062 Wheezing: Secondary | ICD-10-CM

## 2014-03-22 DIAGNOSIS — J200 Acute bronchitis due to Mycoplasma pneumoniae: Secondary | ICD-10-CM

## 2014-03-22 MED ORDER — ALBUTEROL SULFATE HFA 108 (90 BASE) MCG/ACT IN AERS: 2.0000 | INHALATION_SPRAY | Freq: Four times a day (QID) | RESPIRATORY_TRACT | Status: DC | PRN

## 2014-03-22 MED ORDER — BENZONATATE 200 MG PO CAPS: 200.0000 mg | ORAL_CAPSULE | Freq: Three times a day (TID) | ORAL | Status: DC | PRN

## 2014-03-22 MED ORDER — AZITHROMYCIN 250 MG PO TABS: ORAL_TABLET | ORAL | Status: DC

## 2014-03-22 NOTE — Progress Notes (Signed)
   Subjective:    Patient ID: Blake [name], male    DOB: 09-07-1980, 33 y.o.   MRN: [id number]  Cough This is a new problem. Episode onset: 3 weeks ago. The problem has been gradually worsening. The cough is productive of sputum. Associated symptoms include chest pain, myalgias, rhinorrhea and wheezing. He has tried OTC cough suppressant for the symptoms. The treatment provided mild relief.   PMH benign quit smoking a few weeks ago. Patient states he hears himself wheezing at night. Little short of breath with some activity.  Review of Systems  HENT: Positive for rhinorrhea.   Respiratory: Positive for cough and wheezing.   Cardiovascular: Positive for chest pain.  Musculoskeletal: Positive for myalgias.       Objective:   Physical Exam Tight cough noted intermittent wheezing noted neck is supple eardrums normal throat normal not rest or distress       Assessment & Plan:  Viral syndrome, probable mycoplasma bronchitis because of the length of this, wheezing and reactive airway  Treated with Zithromax, supportive measures, albuterol. If not improvement over the next couple weeks consider prednisone taper as well as steroid inhaler as well as pre-and post lung function testing

## 2014-04-13 ENCOUNTER — Encounter (HOSPITAL_COMMUNITY): Payer: Self-pay | Admitting: *Deleted

## 2014-04-13 ENCOUNTER — Emergency Department (HOSPITAL_COMMUNITY): Payer: BLUE CROSS/BLUE SHIELD

## 2014-04-13 ENCOUNTER — Emergency Department (HOSPITAL_COMMUNITY)
Admission: EM | Admit: 2014-04-13 | Discharge: 2014-04-13 | Disposition: A | Discharge status: Home / Self Care | Payer: BLUE CROSS/BLUE SHIELD | Attending: Emergency Medicine | Admitting: Emergency Medicine

## 2014-04-13 DIAGNOSIS — Y9389 Activity, other specified: Secondary | ICD-10-CM | POA: Insufficient documentation

## 2014-04-13 DIAGNOSIS — Z87891 Personal history of nicotine dependence: Secondary | ICD-10-CM | POA: Insufficient documentation

## 2014-04-13 DIAGNOSIS — X58XXXA Exposure to other specified factors, initial encounter: Secondary | ICD-10-CM | POA: Insufficient documentation

## 2014-04-13 DIAGNOSIS — Z79899 Other long term (current) drug therapy: Secondary | ICD-10-CM | POA: Insufficient documentation

## 2014-04-13 DIAGNOSIS — Z792 Long term (current) use of antibiotics: Secondary | ICD-10-CM | POA: Diagnosis not present

## 2014-04-13 DIAGNOSIS — R55 Syncope and collapse: Secondary | ICD-10-CM | POA: Diagnosis present

## 2014-04-13 DIAGNOSIS — G4730 Sleep apnea, unspecified: Secondary | ICD-10-CM | POA: Diagnosis not present

## 2014-04-13 DIAGNOSIS — E780 Pure hypercholesterolemia: Secondary | ICD-10-CM | POA: Insufficient documentation

## 2014-04-13 DIAGNOSIS — S01111A Laceration without foreign body of right eyelid and periocular area, initial encounter: Secondary | ICD-10-CM | POA: Diagnosis not present

## 2014-04-13 DIAGNOSIS — F329 Major depressive disorder, single episode, unspecified: Secondary | ICD-10-CM | POA: Diagnosis not present

## 2014-04-13 DIAGNOSIS — Y9289 Other specified places as the place of occurrence of the external cause: Secondary | ICD-10-CM | POA: Diagnosis not present

## 2014-04-13 DIAGNOSIS — Z9981 Dependence on supplemental oxygen: Secondary | ICD-10-CM | POA: Insufficient documentation

## 2014-04-13 DIAGNOSIS — Y990 Civilian activity done for income or pay: Secondary | ICD-10-CM | POA: Insufficient documentation

## 2014-04-13 DIAGNOSIS — Z880 Allergy status to penicillin: Secondary | ICD-10-CM | POA: Diagnosis not present

## 2014-04-13 DIAGNOSIS — S0511XA Contusion of eyeball and orbital tissues, right eye, initial encounter: Secondary | ICD-10-CM

## 2014-04-13 HISTORY — DX: Major depressive disorder, single episode, unspecified: F32.9

## 2014-04-13 HISTORY — DX: Depression, unspecified: F32.A

## 2014-04-13 LAB — CBC WITH DIFFERENTIAL/PLATELET
Basophils Absolute: 0 10*3/uL (ref 0.0–0.1)
Basophils Relative: 0 % (ref 0–1)
EOS PCT: 5 % (ref 0–5)
Eosinophils Absolute: 0.4 10*3/uL (ref 0.0–0.7)
HCT: 41.6 % (ref 39.0–52.0)
Hemoglobin: 14.6 g/dL (ref 13.0–17.0)
LYMPHS ABS: 2.2 10*3/uL (ref 0.7–4.0)
LYMPHS PCT: 28 % (ref 12–46)
MCH: 31.7 pg (ref 26.0–34.0)
MCHC: 35.1 g/dL (ref 30.0–36.0)
MCV: 90.2 fL (ref 78.0–100.0)
Monocytes Absolute: 0.7 10*3/uL (ref 0.1–1.0)
Monocytes Relative: 9 % (ref 3–12)
NEUTROS ABS: 4.7 10*3/uL (ref 1.7–7.7)
Neutrophils Relative %: 58 % (ref 43–77)
PLATELETS: 244 10*3/uL (ref 150–400)
RBC: 4.61 MIL/uL (ref 4.22–5.81)
RDW: 13.2 % (ref 11.5–15.5)
WBC: 8 10*3/uL (ref 4.0–10.5)

## 2014-04-13 LAB — BASIC METABOLIC PANEL
ANION GAP: 8 (ref 5–15)
BUN: 11 mg/dL (ref 6–23)
CALCIUM: 8.6 mg/dL (ref 8.4–10.5)
CHLORIDE: 107 meq/L (ref 96–112)
CO2: 21 mmol/L (ref 19–32)
CREATININE: 0.89 mg/dL (ref 0.50–1.35)
GLUCOSE: 119 mg/dL — AB (ref 70–99)
POTASSIUM: 3.9 mmol/L (ref 3.5–5.1)
Sodium: 136 mmol/L (ref 135–145)

## 2014-04-13 LAB — LACTIC ACID, PLASMA: Lactic Acid, Venous: 1.4 mmol/L (ref 0.5–2.2)

## 2014-04-13 LAB — TROPONIN I

## 2014-04-13 MED ORDER — LIDOCAINE-EPINEPHRINE (PF) 1 %-1:200000 IJ SOLN
INTRAMUSCULAR | Status: AC
  Filled 2014-04-13: qty 10

## 2014-04-13 MED ORDER — LIDOCAINE-EPINEPHRINE (PF) 1 %-1:200000 IJ SOLN
10.0000 mL | Freq: Once | INTRAMUSCULAR | Status: AC
  Administered 2014-04-13: 10 mL

## 2014-04-13 NOTE — ED Provider Notes (Signed)
CSN: [id number]     Arrival date & time 04/13/14  0039 History   First MD Initiated Contact with Patient 04/13/14 [phone]     Chief Complaint  Patient presents with  . Loss of Consciousness     (Consider location/radiation/quality/duration/timing/severity/associated sxs/prior Treatment) Patient is a 34 y.o. male presenting with syncope. The history is provided by the patient.  Loss of Consciousness He was working in computer at 11 PM when he decided to go to bed. In the next thing he remembers, he was on the floor by the computer with blood on the floor. That was at about 11:30 PM. He stated he felt fine up until the time he lost consciousness. He denies chest pain, heaviness, tightness, pressure. He denies palpitations. Denies dizziness or lightheadedness. He denies bowel or bladder incontinence. He did several laceration to his right eyebrow. He does not known his last tetanus immunization was.  Past Medical History  Diagnosis Date  . High blood cholesterol level   . Sleep apnea 01/07/13    cpap  . Depression    Past Surgical History  Procedure Laterality Date  . Appendectomy    . Cholecystectomy    . Abdominal surgery     Family History  Problem Relation Age of Onset  . Diabetes Father    History  Substance Use Topics  . Smoking status: Former Smoker    Quit date: 03/01/2014  . Smokeless tobacco: Not on file  . Alcohol Use: 0.0 oz/week    0 Not specified per week    Review of Systems  Cardiovascular: Positive for syncope.  All other systems reviewed and are negative.     Allergies  Penicillins  Home Medications   Prior to Admission medications   Medication Sig Start Date End Date Taking? Authorizing Provider  albuterol (PROVENTIL HFA;VENTOLIN HFA) 108 (90 BASE) MCG/ACT inhaler Inhale 2 puffs into the lungs every 6 (six) hours as needed for wheezing. 03/22/14   Babs Sciara, MD  azithromycin (ZITHROMAX Z-PAK) 250 MG tablet Take 2 tablets (500 mg) on  Day 1,   followed by 1 tablet (250 mg) once daily on Days 2 through 5. 03/22/14   Babs Sciara, MD  benzonatate (TESSALON) 200 MG capsule Take 1 capsule (200 mg total) by mouth 3 (three) times daily as needed for cough. 03/22/14   Babs Sciara, MD  citalopram (CELEXA) 10 MG tablet Take 1 tablet (10 mg total) by mouth daily. Patient not taking: Reported on 03/22/2014 10/27/13   Babs Sciara, MD  fenofibrate 160 MG tablet TAKE 1 TABLET DAILY (NEED LABS AND OFFICE VISIT) 10/27/13   Babs Sciara, MD   BP 130/66 mmHg  Pulse 84  Temp(Src) 98.5 F (36.9 C) (Oral)  Resp 23  Ht  (1.803 m)  Wt 260 lb (117.935 kg)  BMI 36.28 kg/m2  SpO2 96% Physical Exam  Nursing note and vitals reviewed.  34 year old male, resting comfortably and in no acute distress. Vital signs are significant for tachypnea. Oxygen saturation is 96%, which is normal. Head is normocephalic. There is a 4 cm laceration through the right eyebrow with slight soft tissue swelling. No step-off of the orbital rim. PERRLA, EOMI. Oropharynx is clear. Neck is nontender without adenopathy or JVD. Back is nontender and there is no CVA tenderness. Lungs are clear without rales, wheezes, or rhonchi. Chest is nontender. Heart has regular rate and rhythm without murmur. Abdomen is soft, flat, nontender without masses or hepatosplenomegaly and  peristalsis is normoactive. Extremities have no cyanosis or edema, full range of motion is present. Skin is warm and dry without rash. Neurologic: Mental status is normal, cranial nerves are intact, there are no motor or sensory deficits.  ED Course  Procedures (including critical care time) LACERATION REPAIR Performed by: RUEAV,WUJWJGLICK,Adanely Reynoso Authorized by: XBJYN,WGNFAGLICK,Nisha Dhami Consent: Verbal consent obtained. Risks and benefits: risks, benefits and alternatives were discussed Consent given by: patient Patient identity confirmed: provided demographic data Prepped and Draped in normal sterile fashion Wound  explored  Laceration Location: Right eyebrow  Laceration Length: 4.0 cm  No Foreign Bodies seen or palpated  Anesthesia: local infiltration  Local anesthetic: lidocaine 1% with epinephrine  Anesthetic total: 3 ml  Amount of cleaning: standard  Skin closure: Close   Number of sutures: 8  Technique: Simple interrupted sutures with 5-0 Prolene   Patient tolerance: Patient tolerated the procedure well with no immediate complications.  Labs Review Results for orders placed or performed during the hospital encounter of 04/13/14  Basic metabolic panel  Result Value Ref Range   Sodium 136 135 - 145 mmol/L   Potassium 3.9 3.5 - 5.1 mmol/L   Chloride 107 96 - 112 mEq/L   CO2 21 19 - 32 mmol/L   Glucose, Bld 119 (H) 70 - 99 mg/dL   BUN 11 6 - 23 mg/dL   Creatinine, Ser 2.130.89 0.50 - 1.35 mg/dL   Calcium 8.6 8.4 - 08.610.5 mg/dL   GFR calc non Af Amer >90 >90 mL/min   GFR calc Af Amer >90 >90 mL/min   Anion gap 8 5 - 15  Troponin I  Result Value Ref Range   Troponin I <0.03 <0.031 ng/mL  CBC with Differential  Result Value Ref Range   WBC 8.0 4.0 - 10.5 K/uL   RBC 4.61 4.22 - 5.81 MIL/uL   Hemoglobin 14.6 13.0 - 17.0 g/dL   HCT 57.841.6 46.939.0 - 62.952.0 %   MCV 90.2 78.0 - 100.0 fL   MCH 31.7 26.0 - 34.0 pg   MCHC 35.1 30.0 - 36.0 g/dL   RDW 52.813.2 41.311.5 - 24.415.5 %   Platelets 244 150 - 400 K/uL   Neutrophils Relative % 58 43 - 77 %   Neutro Abs 4.7 1.7 - 7.7 K/uL   Lymphocytes Relative 28 12 - 46 %   Lymphs Abs 2.2 0.7 - 4.0 K/uL   Monocytes Relative 9 3 - 12 %   Monocytes Absolute 0.7 0.1 - 1.0 K/uL   Eosinophils Relative 5 0 - 5 %   Eosinophils Absolute 0.4 0.0 - 0.7 K/uL   Basophils Relative 0 0 - 1 %   Basophils Absolute 0.0 0.0 - 0.1 K/uL  Lactic acid, plasma  Result Value Ref Range   Lactic Acid, Venous 1.4 0.5 - 2.2 mmol/L    Imaging Review Ct Head Wo Contrast  04/13/2014   CLINICAL DATA:  Syncopal episode today, striking left eyebrow.  EXAM: CT HEAD WITHOUT CONTRAST  CT  CERVICAL SPINE WITHOUT CONTRAST  TECHNIQUE: Multidetector CT imaging of the head and cervical spine was performed following the standard protocol without intravenous contrast. Multiplanar CT image reconstructions of the cervical spine were also generated.  COMPARISON:  06/09/2011  FINDINGS: CT HEAD FINDINGS  Ventricles and sulci appear symmetrical. No mass effect or midline shift. No abnormal extra-axial fluid collections. Gray-white matter junctions are distinct. Basal cisterns are not effaced. No evidence of acute intracranial hemorrhage. No depressed skull fractures. Mucosal thickening and/or opacification of the right  frontal, ethmoid, and visualized maxillary sinuses. This is nonspecific and could be inflammatory or posttraumatic. Right periorbital soft tissue hematoma. Mastoid air cells are not opacified.  CT CERVICAL SPINE FINDINGS  Straightening of the usual cervical lordosis. This may be due to patient positioning but ligamentous injury or muscle spasm could also have this appearance and are not excluded. Degenerative changes with narrowed disc space and anterior and posterior osteophytes at C4-5 and C5-6 levels. No anterior subluxation of cervical vertebrae. Normal alignment of cervical facet joints. No vertebral compression deformities. No prevertebral soft tissue swelling. No focal bone lesion or bone destruction. Bone cortex and trabecular architecture appear intact. Soft tissues are unremarkable. Opacification of right paranasal sinuses again demonstrated.  IMPRESSION: No acute intracranial abnormalities. Opacification of right paranasal sinuses may be inflammatory or posttraumatic. Nonvisualized facial fractures may be present. Correlate with physical examination. Right periorbital soft tissue hematoma.  Nonspecific straightening of the usual cervical lordosis. Mild degenerative changes in the cervical spine. No displaced fractures identified   Electronically Signed   By: Burman Nieves M.D.   On:  04/13/2014 03:27   Ct Cervical Spine Wo Contrast  04/13/2014   CLINICAL DATA:  Syncopal episode today, striking left eyebrow.  EXAM: CT HEAD WITHOUT CONTRAST  CT CERVICAL SPINE WITHOUT CONTRAST  TECHNIQUE: Multidetector CT imaging of the head and cervical spine was performed following the standard protocol without intravenous contrast. Multiplanar CT image reconstructions of the cervical spine were also generated.  COMPARISON:  06/09/2011  FINDINGS: CT HEAD FINDINGS  Ventricles and sulci appear symmetrical. No mass effect or midline shift. No abnormal extra-axial fluid collections. Gray-white matter junctions are distinct. Basal cisterns are not effaced. No evidence of acute intracranial hemorrhage. No depressed skull fractures. Mucosal thickening and/or opacification of the right frontal, ethmoid, and visualized maxillary sinuses. This is nonspecific and could be inflammatory or posttraumatic. Right periorbital soft tissue hematoma. Mastoid air cells are not opacified.  CT CERVICAL SPINE FINDINGS  Straightening of the usual cervical lordosis. This may be due to patient positioning but ligamentous injury or muscle spasm could also have this appearance and are not excluded. Degenerative changes with narrowed disc space and anterior and posterior osteophytes at C4-5 and C5-6 levels. No anterior subluxation of cervical vertebrae. Normal alignment of cervical facet joints. No vertebral compression deformities. No prevertebral soft tissue swelling. No focal bone lesion or bone destruction. Bone cortex and trabecular architecture appear intact. Soft tissues are unremarkable. Opacification of right paranasal sinuses again demonstrated.  IMPRESSION: No acute intracranial abnormalities. Opacification of right paranasal sinuses may be inflammatory or posttraumatic. Nonvisualized facial fractures may be present. Correlate with physical examination. Right periorbital soft tissue hematoma.  Nonspecific straightening of the  usual cervical lordosis. Mild degenerative changes in the cervical spine. No displaced fractures identified   Electronically Signed   By: Burman Nieves M.D.   On: 04/13/2014 03:27     EKG Interpretation   Date/Time:  Tuesday April 13 2014 01:04:39 EST Ventricular Rate:  76 PR Interval:  140 QRS Duration: 90 QT Interval:  350 QTC Calculation: 393 R Axis:   67 Text Interpretation:  Sinus rhythm Normal ECG When compared with ECG of  06/09/2011, No significant change was found Confirmed by [facility]  MD, Aiman Noe  ([id number]) on 04/13/2014 1:36:43 AM      MDM   Final diagnoses:  Syncope  Laceration of eyebrow, right, initial encounter  Periorbital contusion, right, initial encounter    Syncope of uncertain cause. The fact that it  occurred while standing from sitting position suggests possible orthostatic syncope. ECG is unremarkable. He will be sent for CT head and a screening labs obtained.  Workup is unremarkable including normal lactic acid. Doubt seizure. At this point, most likely etiology is orthostatic syncope even though all orthostatic vital signs in the ED were unremarkable. He is referred back to PCP for follow-up. Sutures need to be removed in 4-5 days.  Dione Booze, MD 04/13/14 [phone]

## 2014-04-13 NOTE — ED Notes (Signed)
Pt states he was working on the computer and looked at clock at 2300, pt states he got up and when he woke up he was in a puddle of blood lying on the floor; pt has open laceration to right brow; bleeding controlled at this time; pt c/o feeling light headed at this time

## 2014-04-13 NOTE — ED Notes (Signed)
Dr. Glick at bedside.  

## 2014-04-13 NOTE — ED Notes (Signed)
Pt. Given ice pack. 

## 2014-04-13 NOTE — Discharge Instructions (Signed)
Stitches need to be removed in 4-5 days. This can be done here, at an urgent care center, or anterior doctor's office.  Laceration Care, Adult A laceration is a cut or lesion that goes through all layers of the skin and into the tissue just beneath the skin. TREATMENT  Some lacerations may not require closure. Some lacerations may not be able to be closed due to an increased risk of infection. It is important to see your caregiver as soon as possible after an injury to minimize the risk of infection and maximize the opportunity for successful closure. If closure is appropriate, pain medicines may be given, if needed. The wound will be cleaned to help prevent infection. Your caregiver will use stitches (sutures), staples, wound glue (adhesive), or skin adhesive strips to repair the laceration. These tools bring the skin edges together to allow for faster healing and a better cosmetic outcome. However, all wounds will heal with a scar. Once the wound has healed, scarring can be minimized by covering the wound with sunscreen during the day for 1 full year. HOME CARE INSTRUCTIONS  For sutures or staples:  Keep the wound clean and dry.  If you were given a bandage (dressing), you should change it at least once a day. Also, change the dressing if it becomes wet or dirty, or as directed by your caregiver.  Wash the wound with soap and water 2 times a day. Rinse the wound off with water to remove all soap. Pat the wound dry with a clean towel.  After cleaning, apply a thin layer of the antibiotic ointment as recommended by your caregiver. This will help prevent infection and keep the dressing from sticking.  You may shower as usual after the first 24 hours. Do not soak the wound in water until the sutures are removed.  Only take over-the-counter or prescription medicines for pain, discomfort, or fever as directed by your caregiver.  Get your sutures or staples removed as directed by your  caregiver. For skin adhesive strips:  Keep the wound clean and dry.  Do not get the skin adhesive strips wet. You may bathe carefully, using caution to keep the wound dry.  If the wound gets wet, pat it dry with a clean towel.  Skin adhesive strips will fall off on their own. You may trim the strips as the wound heals. Do not remove skin adhesive strips that are still stuck to the wound. They will fall off in time. For wound adhesive:  You may briefly wet your wound in the shower or bath. Do not soak or scrub the wound. Do not swim. Avoid periods of heavy perspiration until the skin adhesive has fallen off on its own. After showering or bathing, gently pat the wound dry with a clean towel.  Do not apply liquid medicine, cream medicine, or ointment medicine to your wound while the skin adhesive is in place. This may loosen the film before your wound is healed.  If a dressing is placed over the wound, be careful not to apply tape directly over the skin adhesive. This may cause the adhesive to be pulled off before the wound is healed.  Avoid prolonged exposure to sunlight or tanning lamps while the skin adhesive is in place. Exposure to ultraviolet light in the first year will darken the scar.  The skin adhesive will usually remain in place for 5 to 10 days, then naturally fall off the skin. Do not pick at the adhesive film. You may  need a tetanus shot if:  You cannot remember when you had your last tetanus shot.  You have never had a tetanus shot. If you get a tetanus shot, your arm may swell, get red, and feel warm to the touch. This is common and not a problem. If you need a tetanus shot and you choose not to have one, there is a rare chance of getting tetanus. Sickness from tetanus can be serious. SEEK MEDICAL CARE IF:   You have redness, swelling, or increasing pain in the wound.  You see a red line that goes away from the wound.  You have yellowish-white fluid (pus) coming from  the wound.  You have a fever.  You notice a bad smell coming from the wound or dressing.  Your wound breaks open before or after sutures have been removed.  You notice something coming out of the wound such as wood or glass.  Your wound is on your hand or foot and you cannot move a finger or toe. SEEK IMMEDIATE MEDICAL CARE IF:   Your pain is not controlled with prescribed medicine.  You have severe swelling around the wound causing pain and numbness or a change in color in your arm, hand, leg, or foot.  Your wound splits open and starts bleeding.  You have worsening numbness, weakness, or loss of function of any joint around or beyond the wound.  You develop painful lumps near the wound or on the skin anywhere on your body. MAKE SURE YOU:   Understand these instructions.  Will watch your condition.  Will get help right away if you are not doing well or get worse. Document Released: 03/26/2005 Document Revised: 06/18/2011 Document Reviewed: 09/19/2010 [facility] Patient Information 2015 [place], [place]. This information is not intended to replace advice given to you by your health care provider. Make sure you discuss any questions you have with your health care provider.   Contusion A contusion is a deep bruise. Contusions are the result of an injury that caused bleeding under the skin. The contusion may turn blue, purple, or yellow. Minor injuries will give you a painless contusion, but more severe contusions may stay painful and swollen for a few weeks.  CAUSES  A contusion is usually caused by a blow, trauma, or direct force to an area of the body. SYMPTOMS   Swelling and redness of the injured area.  Bruising of the injured area.  Tenderness and soreness of the injured area.  Pain. DIAGNOSIS  The diagnosis can be made by taking a history and physical exam. An X-ray, CT scan, or MRI may be needed to determine if there were any associated injuries, such as  fractures. TREATMENT  Specific treatment will depend on what area of the body was injured. In general, the best treatment for a contusion is resting, icing, elevating, and applying cold compresses to the injured area. Over-the-counter medicines may also be recommended for pain control. Ask your caregiver what the best treatment is for your contusion. HOME CARE INSTRUCTIONS   Put ice on the injured area.  Put ice in a plastic bag.  Place a towel between your skin and the bag.  Leave the ice on for 15-20 minutes, 3-4 times a day, or as directed by your health care provider.  Only take over-the-counter or prescription medicines for pain, discomfort, or fever as directed by your caregiver. Your caregiver may recommend avoiding anti-inflammatory medicines (aspirin, ibuprofen, and naproxen) for 48 hours because these medicines may increase bruising.  Rest the injured area.  If possible, elevate the injured area to reduce swelling. SEEK IMMEDIATE MEDICAL CARE IF:   You have increased bruising or swelling.  You have pain that is getting worse.  Your swelling or pain is not relieved with medicines. MAKE SURE YOU:   Understand these instructions.  Will watch your condition.  Will get help right away if you are not doing well or get worse. Document Released: 01/03/2005 Document Revised: 03/31/2013 Document Reviewed: 01/29/2011 [facility] Patient Information 2015 [place], [place]. This information is not intended to replace advice given to you by your health care provider. Make sure you discuss any questions you have with your health care provider.  Syncope Syncope is a medical term for fainting or passing out. This means you lose consciousness and drop to the ground. People are generally unconscious for less than 5 minutes. You may have some muscle twitches for up to 15 seconds before waking up and returning to normal. Syncope occurs more often in older adults, but it can happen to anyone.  While most causes of syncope are not dangerous, syncope can be a sign of a serious medical problem. It is important to seek medical care.  CAUSES  Syncope is caused by a sudden drop in blood flow to the brain. The specific cause is often not determined. Factors that can bring on syncope include:  Taking medicines that lower blood pressure.  Sudden changes in posture, such as standing up quickly.  Taking more medicine than prescribed.  Standing in one place for too long.  Seizure disorders.  Dehydration and excessive exposure to heat.  Low blood sugar (hypoglycemia).  Straining to have a bowel movement.  Heart disease, irregular heartbeat, or other circulatory problems.  Fear, emotional distress, seeing blood, or severe pain. SYMPTOMS  Right before fainting, you may:  Feel dizzy or light-headed.  Feel nauseous.  See all white or all black in your field of vision.  Have cold, clammy skin. DIAGNOSIS  Your health care provider will ask about your symptoms, perform a physical exam, and perform an electrocardiogram (ECG) to record the electrical activity of your heart. Your health care provider may also perform other heart or blood tests to determine the cause of your syncope which may include:  Transthoracic echocardiogram (TTE). During echocardiography, sound waves are used to evaluate how blood flows through your heart.  Transesophageal echocardiogram (TEE).  Cardiac monitoring. This allows your health care provider to monitor your heart rate and rhythm in real time.  Holter monitor. This is a portable device that records your heartbeat and can help diagnose heart arrhythmias. It allows your health care provider to track your heart activity for several days, if needed.  Stress tests by exercise or by giving medicine that makes the heart beat faster. TREATMENT  In most cases, no treatment is needed. Depending on the cause of your syncope, your health care provider may  recommend changing or stopping some of your medicines. HOME CARE INSTRUCTIONS  Have someone stay with you until you feel stable.  Do not drive, use machinery, or play sports until your health care provider says it is okay.  Keep all follow-up appointments as directed by your health care provider.  Lie down right away if you start feeling like you might faint. Breathe deeply and steadily. Wait until all the symptoms have passed.  Drink enough fluids to keep your urine clear or pale yellow.  If you are taking blood pressure or heart medicine, get up slowly  and take several minutes to sit and then stand. This can reduce dizziness. SEEK IMMEDIATE MEDICAL CARE IF:   You have a severe headache.  You have unusual pain in the chest, abdomen, or back.  You are bleeding from your mouth or rectum, or you have black or tarry stool.  You have an irregular or very fast heartbeat.  You have pain with breathing.  You have repeated fainting or seizure-like jerking during an episode.  You faint when sitting or lying down.  You have confusion.  You have trouble walking.  You have severe weakness.  You have vision problems. If you fainted, call your local emergency services (911 in [place] Do not drive yourself to the hospital.  MAKE SURE YOU:  Understand these instructions.  Will watch your condition.  Will get help right away if you are not doing well or get worse. Document Released: 03/26/2005 Document Revised: 03/31/2013 Document Reviewed: 05/25/2011 [facility] Patient Information 2015 [place], [place]. This information is not intended to replace advice given to you by your health care provider. Make sure you discuss any questions you have with your health care provider.

## 2014-04-20 ENCOUNTER — Encounter: Payer: Self-pay | Admitting: Family Medicine

## 2014-04-20 ENCOUNTER — Ambulatory Visit (INDEPENDENT_AMBULATORY_CARE_PROVIDER_SITE_OTHER): Payer: BLUE CROSS/BLUE SHIELD | Admitting: Family Medicine

## 2014-04-20 VITALS — BP 116/78 | Ht 71.0 in | Wt 264.0 lb

## 2014-04-20 DIAGNOSIS — R55 Syncope and collapse: Secondary | ICD-10-CM

## 2014-04-20 NOTE — Progress Notes (Signed)
   Subjective:    Patient ID: Blake [name], male    DOB: 07-08-1980, 34 y.o.   MRN: [id number]  HPI Patient is here today for a f/u on ER visit on 04/13/14.  Patient passed out while sitting at the computer desk. He must of stood up too fast. He does not recall. He also hit the top of his right eye. Had to get 8 stitches.  Patient states that he sat at the desk looking at upon [employer] videos then he was getting ready to go to bed he clicked off the computer the next thing he remembers he was laying on the floor he states his wife heard a loud sound but never got at the check on him he thinks he was laying on the floor for a proximally 15 the 30 minutes he states he was bleeding over the I went to the ER at the ER they did a CT scan of his neck and his head also did EKG lab work everything looked good. Pay states he feels fine now is never had this before. He denies any history of seizures. Denies any palpitations.   Review of Systems Patient denies any headaches. Vision nausea vomiting diarrhea denies dizziness unilateral numbness or weakness. Denies palpitations chest tightness pressure pain shortness of breath    Objective:   Physical Exam Eardrums normal throat is normal neck supple lungs are clear no crackles heart is regular pulses normal abdomen is soft extremities no edema skin warm dry blood pressure laying sitting standing comparable neurologic grossly normal       Assessment & Plan:  Patient was talked to at length If he has any spells like this again he will need further workup. This does not sound like a seizure. He did not eat or urinate on himself.  I doubt that this is cardiac arrhythmia as a cause of his syncope I think it is most likely orthostatic hypotension. If there is recurrence he will need specialist referral possibly cardiology.

## 2014-06-01 ENCOUNTER — Encounter: Payer: Self-pay | Admitting: Family Medicine

## 2014-06-01 ENCOUNTER — Ambulatory Visit (INDEPENDENT_AMBULATORY_CARE_PROVIDER_SITE_OTHER): Payer: BLUE CROSS/BLUE SHIELD | Admitting: Family Medicine

## 2014-06-01 VITALS — BP 144/90 | Ht 71.0 in | Wt 267.0 lb

## 2014-06-01 DIAGNOSIS — E785 Hyperlipidemia, unspecified: Secondary | ICD-10-CM

## 2014-06-01 DIAGNOSIS — K760 Fatty (change of) liver, not elsewhere classified: Secondary | ICD-10-CM

## 2014-06-01 DIAGNOSIS — Z139 Encounter for screening, unspecified: Secondary | ICD-10-CM

## 2014-06-01 DIAGNOSIS — R454 Irritability and anger: Secondary | ICD-10-CM

## 2014-06-01 DIAGNOSIS — Z79899 Other long term (current) drug therapy: Secondary | ICD-10-CM

## 2014-06-01 DIAGNOSIS — F911 Conduct disorder, childhood-onset type: Secondary | ICD-10-CM

## 2014-06-01 MED ORDER — VARENICLINE TARTRATE 1 MG PO TABS: 1.0000 mg | ORAL_TABLET | Freq: Two times a day (BID) | ORAL | Status: DC

## 2014-06-01 MED ORDER — FENOFIBRATE 160 MG PO TABS: ORAL_TABLET | ORAL | Status: DC

## 2014-06-01 MED ORDER — CITALOPRAM HYDROBROMIDE 20 MG PO TABS: 20.0000 mg | ORAL_TABLET | Freq: Every day | ORAL | Status: DC

## 2014-06-01 MED ORDER — VARENICLINE TARTRATE 0.5 MG X 11 & 1 MG X 42 PO MISC: ORAL | Status: DC

## 2014-06-01 NOTE — Progress Notes (Signed)
   Subjective:    Patient ID: Blake [name], male    DOB: Mar 27, 1981, 34 y.o.   MRN: [id number]  Hyperlipidemia This is a chronic problem. The current episode started more than 1 year ago. Treatments tried: fenofibrate. Compliance problems include adherence to exercise and adherence to diet.    Wants to discuss celexa not helping. This patient states he is only taking 10 mg daily he wonders if he needs a be on a different medicine I talked at length to him about how anger is often human emotion that medications do not always control. Also talked with him about how bumping up the dose of the medicine could help but it's also important for him to consider cognitive behavioral therapy as well as reading through a block on mindfulness with anger.  I talked at length with him about the importance of quitting smoking how he does not one ago down that path of being stuck on smoking. Finally also talked with him about the importance of watching the diet physical activity reduction await in taking his medication for his cholesterol as well as checking lab work. 25 minutes spent with patient. Greater than half was in discussion of all these issues  Pt has started back smoking.    Review of Systems He denies chest tightness pressure pain shortness breath nausea vomiting diarrhea. Denies being suicidal or homicidal.    Objective:   Physical Exam Lungs clear hearts regular pulse normal abdomen soft extremities no anemia skin warm dry blood pressure good       Assessment & Plan:  Counseled to quit smoking use Chantix has use it before without side effects counseled that if he should start feeling depressed or suicidal on medication to immediately stop it notify us  Hyperlipidemia-encourage better diet. Continue medication. Check lab work  Moods-quick to anger. I gave him some tips on how to help handle this increase citalopram to 20 mg to give us feedback in a few weeks time how he is doing  referral for counseling recommended, patient is going to check into the cost of counseling and get back to us

## 2014-06-02 ENCOUNTER — Other Ambulatory Visit: Payer: Self-pay | Admitting: Family Medicine

## 2014-06-08 LAB — HEPATIC FUNCTION PANEL
ALK PHOS: 54 IU/L (ref 39–117)
ALT: 79 IU/L — ABNORMAL HIGH (ref 0–44)
AST: 39 IU/L (ref 0–40)
Albumin: 4.6 g/dL (ref 3.5–5.5)
Bilirubin Total: 0.3 mg/dL (ref 0.0–1.2)
Bilirubin, Direct: 0.12 mg/dL (ref 0.00–0.40)
TOTAL PROTEIN: 7.1 g/dL (ref 6.0–8.5)

## 2014-06-08 LAB — LIPID PANEL
CHOL/HDL RATIO: 8.6 ratio — AB (ref 0.0–5.0)
Cholesterol, Total: 248 mg/dL — ABNORMAL HIGH (ref 100–199)
HDL: 29 mg/dL — ABNORMAL LOW (ref >39)
Triglycerides: 657 mg/dL (ref 0–149)

## 2014-06-08 LAB — GLUCOSE, RANDOM: Glucose: 90 mg/dL (ref 65–99)

## 2014-09-08 ENCOUNTER — Other Ambulatory Visit: Payer: Self-pay | Admitting: *Deleted

## 2014-09-08 MED ORDER — CITALOPRAM HYDROBROMIDE 20 MG PO TABS: 20.0000 mg | ORAL_TABLET | Freq: Every day | ORAL | Status: DC

## 2014-11-23 ENCOUNTER — Other Ambulatory Visit: Payer: Self-pay | Admitting: Family Medicine

## 2014-11-23 NOTE — Telephone Encounter (Signed)
1 refill, send patient a card he needs labs and office visit

## 2014-11-30 ENCOUNTER — Ambulatory Visit: Payer: BLUE CROSS/BLUE SHIELD | Admitting: Family Medicine

## 2014-12-31 ENCOUNTER — Ambulatory Visit (INDEPENDENT_AMBULATORY_CARE_PROVIDER_SITE_OTHER): Payer: BLUE CROSS/BLUE SHIELD | Admitting: Family Medicine

## 2014-12-31 ENCOUNTER — Encounter: Payer: Self-pay | Admitting: Family Medicine

## 2014-12-31 VITALS — BP 142/88 | Temp 98.0°F | Ht 71.0 in | Wt 268.0 lb

## 2014-12-31 DIAGNOSIS — R739 Hyperglycemia, unspecified: Secondary | ICD-10-CM

## 2014-12-31 DIAGNOSIS — R195 Other fecal abnormalities: Secondary | ICD-10-CM

## 2014-12-31 DIAGNOSIS — G4733 Obstructive sleep apnea (adult) (pediatric): Secondary | ICD-10-CM | POA: Diagnosis not present

## 2014-12-31 DIAGNOSIS — R197 Diarrhea, unspecified: Secondary | ICD-10-CM | POA: Diagnosis not present

## 2014-12-31 DIAGNOSIS — R5383 Other fatigue: Secondary | ICD-10-CM | POA: Diagnosis not present

## 2014-12-31 DIAGNOSIS — K760 Fatty (change of) liver, not elsewhere classified: Secondary | ICD-10-CM

## 2014-12-31 DIAGNOSIS — Z125 Encounter for screening for malignant neoplasm of prostate: Secondary | ICD-10-CM | POA: Diagnosis not present

## 2014-12-31 DIAGNOSIS — E785 Hyperlipidemia, unspecified: Secondary | ICD-10-CM

## 2014-12-31 DIAGNOSIS — Z79899 Other long term (current) drug therapy: Secondary | ICD-10-CM

## 2014-12-31 MED ORDER — CHOLESTYRAMINE LIGHT 4 G PO POWD: ORAL | Status: DC

## 2014-12-31 NOTE — Progress Notes (Signed)
   Subjective:    Patient ID: Blake [name], male    DOB: 23-Feb-1981, 34 y.o.   MRN: [id number]  HPIlight headed, dizzy, fatigue and diarrhea. Started about 4 weeks ago. Pt states it starts after eating a full meal.  Pt states employer is requesting a note because he has to go to bathroom a lot.  Patient under a lot of stress at work. He relates typically has to use the restroom is found that he is missing work and as a result his employer is getting on him. Patient does note he needs a lose weight. He does try to watch how he eats to some degree He does have sleep apnea he states he does use machine and it does help Patient also has started smoking recently he knows he needs to quit Patients moods overall doing fairly well. Although he stressful times Has a history of hypertriglyceridemia takes his medicine on a regular basis Neisen and reactive airway currently. PMH-sleep apnea obesity tobacco use, hyperlipidemia Review of Systems  Constitutional: Negative for activity change, appetite change and fatigue.  HENT: Negative for congestion.   Respiratory: Negative for cough.   Cardiovascular: Negative for chest pain.  Gastrointestinal: Negative for abdominal pain.  Endocrine: Negative for polydipsia and polyphagia.  Neurological: Negative for weakness.  Psychiatric/Behavioral: Negative for confusion.       Objective:   Physical Exam  Constitutional: He appears well-nourished. No distress.  Cardiovascular: Normal rate, regular rhythm and normal heart sounds.   No murmur heard. Pulmonary/Chest: Effort normal and breath sounds normal. No respiratory distress.  Musculoskeletal: He exhibits no edema.  Lymphadenopathy:    He has no cervical adenopathy.  Neurological: He is alert.  Psychiatric: His behavior is normal.  Vitals reviewed.         Assessment & Plan:  I believe the lightheaded aspect is related to having a large meal which in turn is causing a insulin release which  in turn causes sugar low which in turn causes him to feel fatigued.  Frequent loose stools related to having his gallbladder removed tried: Ria Comment mean half a scoop twice daily the goal is to get the bowel movements to be less often  Patient doing well with sleep apnea.  Patient is due for his blood work for hyperlipidemia check lipid liver profile Patient with history of fatty liver he's been counseled to exercise watch diet lose weight check liver profile Patient relates sleep apnea he feels like his treatment is doing well he denies any major trouble with that He does relate some intermittent fatigue as well as loose stools we will check for some iliac disease and check CBC make sure no anemia Patient is concerned about diabetes we will be checking A1c Greater than 25 minutes spent with patient discussing multiple different issues. Patient was advised to quit smoking.

## 2015-01-04 LAB — BASIC METABOLIC PANEL
BUN / CREAT RATIO: 9 (ref 8–19)
BUN: 8 mg/dL (ref 6–20)
CHLORIDE: 99 mmol/L (ref 97–108)
CO2: 24 mmol/L (ref 18–29)
Calcium: 9.8 mg/dL (ref 8.7–10.2)
Creatinine, Ser: 0.87 mg/dL (ref 0.76–1.27)
GFR calc Af Amer: 132 mL/min/{1.73_m2} (ref >59)
GFR calc non Af Amer: 114 mL/min/{1.73_m2} (ref >59)
GLUCOSE: 108 mg/dL — AB (ref 65–99)
Potassium: 4.8 mmol/L (ref 3.5–5.2)
SODIUM: 140 mmol/L (ref 134–144)

## 2015-01-04 LAB — CBC WITH DIFFERENTIAL/PLATELET
Basophils Absolute: 0 10*3/uL (ref 0.0–0.2)
Basos: 0 %
EOS (ABSOLUTE): 0.3 10*3/uL (ref 0.0–0.4)
Eos: 4 %
Hematocrit: 46.2 % (ref 37.5–51.0)
Hemoglobin: 15.9 g/dL (ref 12.6–17.7)
Immature Grans (Abs): 0.1 10*3/uL (ref 0.0–0.1)
Immature Granulocytes: 1 %
Lymphocytes Absolute: 2.1 10*3/uL (ref 0.7–3.1)
Lymphs: 24 %
MCH: 32 pg (ref 26.6–33.0)
MCHC: 34.4 g/dL (ref 31.5–35.7)
MCV: 93 fL (ref 79–97)
MONOS ABS: 0.6 10*3/uL (ref 0.1–0.9)
Monocytes: 7 %
Neutrophils Absolute: 5.5 10*3/uL (ref 1.4–7.0)
Neutrophils: 64 %
Platelets: 244 10*3/uL (ref 150–379)
RBC: 4.97 x10E6/uL (ref 4.14–5.80)
RDW: 14.8 % (ref 12.3–15.4)
WBC: 8.6 10*3/uL (ref 3.4–10.8)

## 2015-01-04 LAB — LIPID PANEL
CHOL/HDL RATIO: 10.4 ratio — AB (ref 0.0–5.0)
Cholesterol, Total: 271 mg/dL — ABNORMAL HIGH (ref 100–199)
HDL: 26 mg/dL — AB (ref >39)
TRIGLYCERIDES: 734 mg/dL — AB (ref 0–149)

## 2015-01-04 LAB — HEPATIC FUNCTION PANEL
ALK PHOS: 58 IU/L (ref 39–117)
ALT: 77 IU/L — AB (ref 0–44)
AST: 36 IU/L (ref 0–40)
Albumin: 4.8 g/dL (ref 3.5–5.5)
Bilirubin Total: 0.2 mg/dL (ref 0.0–1.2)
Bilirubin, Direct: 0.1 mg/dL (ref 0.00–0.40)
Total Protein: 7.4 g/dL (ref 6.0–8.5)

## 2015-01-04 LAB — HEMOGLOBIN A1C
Est. average glucose Bld gHb Est-mCnc: 128 mg/dL
HEMOGLOBIN A1C: 6.1 % — AB (ref 4.8–5.6)

## 2015-01-04 LAB — TISSUE TRANSGLUTAMINASE, IGA: Transglutaminase IgA: <2 U/mL (ref 0–3)

## 2015-01-04 LAB — PSA: PROSTATE SPECIFIC AG, SERUM: 0.5 ng/mL (ref 0.0–4.0)

## 2015-01-07 NOTE — Addendum Note (Signed)
Addended by: Theodora Blow R on: 01/07/2015 08:30 AM   Modules accepted: Orders

## 2015-02-19 ENCOUNTER — Other Ambulatory Visit: Payer: Self-pay | Admitting: Family Medicine

## 2015-03-23 ENCOUNTER — Ambulatory Visit: Payer: BLUE CROSS/BLUE SHIELD | Admitting: Family Medicine

## 2015-04-12 ENCOUNTER — Ambulatory Visit: Payer: BLUE CROSS/BLUE SHIELD | Admitting: Family Medicine

## 2015-08-18 ENCOUNTER — Other Ambulatory Visit: Payer: Self-pay | Admitting: Family Medicine

## 2015-11-17 ENCOUNTER — Other Ambulatory Visit: Payer: Self-pay | Admitting: Family Medicine

## 2015-11-24 ENCOUNTER — Other Ambulatory Visit: Payer: Self-pay | Admitting: Family Medicine

## 2016-01-20 ENCOUNTER — Telehealth: Payer: Self-pay | Admitting: Family Medicine

## 2016-01-20 DIAGNOSIS — E785 Hyperlipidemia, unspecified: Secondary | ICD-10-CM

## 2016-01-20 DIAGNOSIS — R7303 Prediabetes: Secondary | ICD-10-CM

## 2016-01-20 DIAGNOSIS — R748 Abnormal levels of other serum enzymes: Secondary | ICD-10-CM

## 2016-01-20 NOTE — Telephone Encounter (Signed)
Requesting order for blood work. °

## 2016-01-22 NOTE — Telephone Encounter (Signed)
Lipid/liv/A1C/Met7- hyperlip/elev liv enzymes,prediabetes

## 2016-01-23 NOTE — Telephone Encounter (Signed)
Spoke with patient and informed him per Dr.Scott Luking- Labs for upcoming appointment have been ordered. Patient verbalized understanding.

## 2016-01-24 LAB — LIPID PANEL
CHOL/HDL RATIO: 8.9 ratio — AB (ref 0.0–5.0)
Cholesterol, Total: 302 mg/dL — ABNORMAL HIGH (ref 100–199)
HDL: 34 mg/dL — ABNORMAL LOW (ref >39)
Triglycerides: 1565 mg/dL (ref 0–149)

## 2016-01-24 LAB — HEMOGLOBIN A1C
Est. average glucose Bld gHb Est-mCnc: 137 mg/dL
Hgb A1c MFr Bld: 6.4 % — ABNORMAL HIGH (ref 4.8–5.6)

## 2016-01-24 LAB — BASIC METABOLIC PANEL
BUN/Creatinine Ratio: 12 (ref 9–20)
BUN: 10 mg/dL (ref 6–20)
CO2: 22 mmol/L (ref 18–29)
Calcium: 9.7 mg/dL (ref 8.7–10.2)
Chloride: 101 mmol/L (ref 96–106)
Creatinine, Ser: 0.85 mg/dL (ref 0.76–1.27)
GFR calc Af Amer: 132 mL/min/{1.73_m2} (ref >59)
GFR calc non Af Amer: 114 mL/min/{1.73_m2} (ref >59)
Glucose: 118 mg/dL — ABNORMAL HIGH (ref 65–99)
Potassium: 4.6 mmol/L (ref 3.5–5.2)
SODIUM: 136 mmol/L (ref 134–144)

## 2016-01-24 LAB — HEPATIC FUNCTION PANEL
ALBUMIN: 4.5 g/dL (ref 3.5–5.5)
ALT: 73 IU/L — AB (ref 0–44)
AST: 47 IU/L — ABNORMAL HIGH (ref 0–40)
Alkaline Phosphatase: 69 IU/L (ref 39–117)
BILIRUBIN TOTAL: 0.4 mg/dL (ref 0.0–1.2)
Bilirubin, Direct: 0.06 mg/dL (ref 0.00–0.40)
Total Protein: 7.3 g/dL (ref 6.0–8.5)

## 2016-01-30 ENCOUNTER — Encounter: Payer: Self-pay | Admitting: Family Medicine

## 2016-01-30 ENCOUNTER — Ambulatory Visit (INDEPENDENT_AMBULATORY_CARE_PROVIDER_SITE_OTHER): Payer: Managed Care, Other (non HMO) | Admitting: Family Medicine

## 2016-01-30 ENCOUNTER — Ambulatory Visit: Payer: Self-pay | Admitting: Family Medicine

## 2016-01-30 VITALS — BP 128/80 | Ht 71.0 in | Wt 267.5 lb

## 2016-01-30 DIAGNOSIS — K760 Fatty (change of) liver, not elsewhere classified: Secondary | ICD-10-CM | POA: Diagnosis not present

## 2016-01-30 DIAGNOSIS — R7303 Prediabetes: Secondary | ICD-10-CM

## 2016-01-30 DIAGNOSIS — E781 Pure hyperglyceridemia: Secondary | ICD-10-CM

## 2016-01-30 MED ORDER — VARENICLINE TARTRATE 0.5 MG X 11 & 1 MG X 42 PO MISC: ORAL | 0 refills | Status: DC

## 2016-01-30 MED ORDER — FENOFIBRATE 160 MG PO TABS: 160.0000 mg | ORAL_TABLET | Freq: Every day | ORAL | 1 refills | Status: DC

## 2016-01-30 MED ORDER — AZITHROMYCIN 250 MG PO TABS: ORAL_TABLET | ORAL | 0 refills | Status: DC

## 2016-01-30 MED ORDER — VARENICLINE TARTRATE 0.5 MG PO TABS: 0.5000 mg | ORAL_TABLET | Freq: Two times a day (BID) | ORAL | 2 refills | Status: DC

## 2016-01-30 NOTE — Progress Notes (Addendum)
   Subjective:    Patient ID: Blake [name], male    DOB: Nov 12, 1980, 35 y.o.   MRN: [id number]  Hyperlipidemia  This is a chronic problem. The current episode started more than 1 year ago. Associated symptoms include shortness of breath. Pertinent negatives include no chest pain. There are no compliance problems.    Patients recent lab work shows his A1c is gone up to 6.4 in addition to this patient's triglycerides are extremely elevated. Patient states he stopped taking his medicine a few months ago He also smokes he knows he needs to quit he's quit before he would like to have some Chantix he understands the side effects of Chantix we reviewed that in detail including if he starts having any suicidal ideation or depression with it stop medicine right away also if having severe worrisome dream stop medicine right away Patient has concerns of cough.   Review of Systems  Constitutional: Negative for activity change, appetite change, fatigue and fever.  HENT: Negative for congestion.   Respiratory: Positive for cough and shortness of breath.   Cardiovascular: Negative for chest pain.  Gastrointestinal: Negative for abdominal pain.  Endocrine: Negative for polydipsia and polyphagia.  Neurological: Negative for weakness.  Psychiatric/Behavioral: Negative for confusion.       Objective:   Physical Exam  Constitutional: He appears well-nourished. No distress.  Cardiovascular: Normal rate, regular rhythm and normal heart sounds.   No murmur heard. Pulmonary/Chest: Effort normal and breath sounds normal. No respiratory distress.  Musculoskeletal: He exhibits no edema.  Lymphadenopathy:    He has no cervical adenopathy.  Neurological: He is alert.  Psychiatric: His behavior is normal.  Vitals reviewed.  I do not feel the patient needs CAT scan at this time if he starts having hemoptysis ongoing coughing or getting worse he needs notify us right away  25 minutes was spent with the  patient. Greater than half the time was spent in discussion and answering questions and counseling regarding the issues that the patient came in for today.        Assessment & Plan:  Bronchitis possible sinusitis antibiotics prescribed warning signs discussed Smoking abuse-Chantix see above discussion if any problems with the medicine promptly stop and call or follow-up immediately if depressed or suicidal here or ER immediately  Hyperlipidemia restart the medication. Watch diet closely work on exercising diet bring weight down Elevated liver enzymes repeat again in a few months if still elevated will need ultrasound additional lab work Prediabetes borderline diabetic patient does not one to start metformin. Watch diet recheck A1c in approximate 3 months  Patient relates he is taking his Celexa and doing well on it.

## 2016-01-30 NOTE — Patient Instructions (Addendum)
Diabetes Mellitus and Food It is important for you to manage your blood sugar (glucose) level. Your blood glucose level can be greatly affected by what you eat. Eating healthier foods in the appropriate amounts throughout the day at about the same time each day will help you control your blood glucose level. It can also help slow or prevent worsening of your diabetes mellitus. Healthy eating may even help you improve the level of your blood pressure and reach or maintain a healthy weight.  General recommendations for healthful eating and cooking habits include:  Eating meals and snacks regularly. Avoid going long periods of time without eating to lose weight.  Eating a diet that consists mainly of plant-based foods, such as fruits, vegetables, nuts, legumes, and whole grains.  Using low-heat cooking methods, such as baking, instead of high-heat cooking methods, such as deep frying. Work with your dietitian to make sure you understand how to use the Nutrition Facts information on food labels. HOW CAN FOOD AFFECT ME? Carbohydrates Carbohydrates affect your blood glucose level more than any other type of food. Your dietitian will help you determine how many carbohydrates to eat at each meal and teach you how to count carbohydrates. Counting carbohydrates is important to keep your blood glucose at a healthy level, especially if you are using insulin or taking certain medicines for diabetes mellitus. Alcohol Alcohol can cause sudden decreases in blood glucose (hypoglycemia), especially if you use insulin or take certain medicines for diabetes mellitus. Hypoglycemia can be a life-threatening condition. Symptoms of hypoglycemia (sleepiness, dizziness, and disorientation) are similar to symptoms of having too much alcohol.  If your health care provider has given you approval to drink alcohol, do so in moderation and use the following guidelines:  Women should not have more than one drink per day, and men  should not have more than two drinks per day. One drink is equal to:  12 oz of beer.  5 oz of wine.  1 oz of hard liquor.  Do not drink on an empty stomach.  Keep yourself hydrated. Have water, diet soda, or unsweetened iced tea.  Regular soda, juice, and other mixers might contain a lot of carbohydrates and should be counted. WHAT FOODS ARE NOT RECOMMENDED? As you make food choices, it is important to remember that all foods are not the same. Some foods have fewer nutrients per serving than other foods, even though they might have the same number of calories or carbohydrates. It is difficult to get your body what it needs when you eat foods with fewer nutrients. Examples of foods that you should avoid that are high in calories and carbohydrates but low in nutrients include:  Trans fats (most processed foods list trans fats on the Nutrition Facts label).  Regular soda.  Juice.  Candy.  Sweets, such as cake, pie, doughnuts, and cookies.  Fried foods. WHAT FOODS CAN I EAT? Eat nutrient-rich foods, which will nourish your body and keep you healthy. The food you should eat also will depend on several factors, including:  The calories you need.  The medicines you take.  Your weight.  Your blood glucose level.  Your blood pressure level.  Your cholesterol level. You should eat a variety of foods, including:  Protein.  Lean cuts of meat.  Proteins low in saturated fats, such as fish, egg whites, and beans. Avoid processed meats.  Fruits and vegetables.  Fruits and vegetables that may help control blood glucose levels, such as apples, mangoes, and   yams.  Dairy products.  Choose fat-free or low-fat dairy products, such as milk, yogurt, and cheese.  Grains, bread, pasta, and rice.  Choose whole grain products, such as multigrain bread, whole oats, and brown rice. These foods may help control blood pressure.  Fats.  Foods containing healthful fats, such as nuts,  avocado, olive oil, canola oil, and fish. DOES EVERYONE WITH DIABETES MELLITUS HAVE THE SAME MEAL PLAN? Because every person with diabetes mellitus is different, there is not one meal plan that works for everyone. It is very important that you meet with a dietitian who will help you create a meal plan that is just right for you.   This information is not intended to replace advice given to you by your health care provider. Make sure you discuss any questions you have with your health care provider.   Document Released: 12/21/2004 Document Revised: 04/16/2014 Document Reviewed: 02/20/2013 Elsevier Interactive Patient Education 2016 [employer] Food Choices to Lower Your Triglycerides Triglycerides are a type of fat in your blood. High levels of triglycerides can increase the risk of heart disease and stroke. If your triglyceride levels are high, the foods you eat and your eating habits are very important. Choosing the right foods can help lower your triglycerides.  WHAT GENERAL GUIDELINES DO I NEED TO FOLLOW?  Lose weight if you are overweight.   Limit or avoid alcohol.   Fill one half of your plate with vegetables and green salads.   Limit fruit to two servings a day. Choose fruit instead of juice.   Make one fourth of your plate whole grains. Look for the word "whole" as the first word in the ingredient list.  Fill one fourth of your plate with lean protein foods.  Enjoy fatty fish (such as salmon, mackerel, sardines, and tuna) three times a week.   Choose healthy fats.   Limit foods high in starch and sugar.  Eat more home-cooked food and less restaurant, buffet, and fast food.  Limit fried foods.  Cook foods using methods other than frying.  Limit saturated fats.  Check ingredient lists to avoid foods with partially hydrogenated oils (trans fats) in them. WHAT FOODS CAN I EAT?  Grains Whole grains, such as whole wheat or whole grain breads, crackers, cereals, and  pasta. Unsweetened oatmeal, bulgur, barley, quinoa, or brown rice. Corn or whole wheat flour tortillas.  Vegetables Fresh or frozen vegetables (raw, steamed, roasted, or grilled). Green salads. Fruits All fresh, canned (in natural juice), or frozen fruits. Meat and Other Protein Products Ground beef (85% or leaner), grass-fed beef, or beef trimmed of fat. Skinless chicken or [place]. Ground chicken or [place]. Pork trimmed of fat. All fish and seafood. Eggs. Dried beans, peas, or lentils. Unsalted nuts or seeds. Unsalted canned or dry beans. Dairy Low-fat dairy products, such as skim or 1% milk, 2% or reduced-fat cheeses, low-fat ricotta or cottage cheese, or plain low-fat yogurt. Fats and Oils Tub margarines without trans fats. Light or reduced-fat mayonnaise and salad dressings. Avocado. Safflower, olive, or canola oils. Natural peanut or almond butter. The items listed above may not be a complete list of recommended foods or beverages. Contact your dietitian for more options. WHAT FOODS ARE NOT RECOMMENDED?  Grains White bread. White pasta. White rice. Cornbread. Bagels, pastries, and croissants. Crackers that contain trans fat. Vegetables White potatoes. Corn. Creamed or fried vegetables. Vegetables in a cheese sauce. Fruits Dried fruits. Canned fruit in light or heavy syrup. Fruit juice. Meat and Other  Protein Products Fatty cuts of meat. Ribs, chicken wings, bacon, sausage, bologna, salami, chitterlings, fatback, hot dogs, bratwurst, and packaged luncheon meats. Dairy Whole or 2% milk, cream, half-and-half, and cream cheese. Whole-fat or sweetened yogurt. Full-fat cheeses. Nondairy creamers and whipped toppings. Processed cheese, cheese spreads, or cheese curds. Sweets and Desserts Corn syrup, sugars, honey, and molasses. Candy. Jam and jelly. Syrup. Sweetened cereals. Cookies, pies, cakes, donuts, muffins, and ice cream. Fats and Oils Butter, stick margarine, lard, shortening, ghee, or  bacon fat. Coconut, palm kernel, or palm oils. Beverages Alcohol. Sweetened drinks (such as sodas, lemonade, and fruit drinks or punches). The items listed above may not be a complete list of foods and beverages to avoid. Contact your dietitian for more information.   This information is not intended to replace advice given to you by your health care provider. Make sure you discuss any questions you have with your health care provider.   Document Released: 01/12/2004 Document Revised: 04/16/2014 Document Reviewed: 01/28/2013 Elsevier Interactive Patient Education 2016 [employer]. Smoking Cessation, Tips for Success If you are ready to quit smoking, congratulations! You have chosen to help yourself be healthier. Cigarettes bring nicotine, tar, carbon monoxide, and other irritants into your body. Your lungs, heart, and blood vessels will be able to work better without these poisons. There are many different ways to quit smoking. Nicotine gum, nicotine patches, a nicotine inhaler, or nicotine nasal spray can help with physical craving. Hypnosis, support groups, and medicines help break the habit of smoking. WHAT THINGS CAN I DO TO MAKE QUITTING EASIER?  Here are some tips to help you quit for good:  Pick a date when you will quit smoking completely. Tell all of your friends and family about your plan to quit on that date.  Do not try to slowly cut down on the number of cigarettes you are smoking. Pick a quit date and quit smoking completely starting on that day.  Throw away all cigarettes.   Clean and remove all ashtrays from your home, work, and car.  On a card, write down your reasons for quitting. Carry the card with you and read it when you get the urge to smoke.  Cleanse your body of nicotine. Drink enough water and fluids to keep your urine clear or pale yellow. Do this after quitting to flush the nicotine from your body.  Learn to predict your moods. Do not let a bad situation be  your excuse to have a cigarette. Some situations in your life might tempt you into wanting a cigarette.  Never have "just one" cigarette. It leads to wanting another and another. Remind yourself of your decision to quit.  Change habits associated with smoking. If you smoked while driving or when feeling stressed, try other activities to replace smoking. Stand up when drinking your coffee. Brush your teeth after eating. Sit in a different chair when you read the paper. Avoid alcohol while trying to quit, and try to drink fewer caffeinated beverages. Alcohol and caffeine may urge you to smoke.  Avoid foods and drinks that can trigger a desire to smoke, such as sugary or spicy foods and alcohol.  Ask people who smoke not to smoke around you.  Have something planned to do right after eating or having a cup of coffee. For example, plan to take a walk or exercise.  Try a relaxation exercise to calm you down and decrease your stress. Remember, you may be tense and nervous for the first 2 weeks  after you quit, but this will pass.  Find new activities to keep your hands busy. Play with a pen, coin, or rubber band. Doodle or draw things on paper.  Brush your teeth right after eating. This will help cut down on the craving for the taste of tobacco after meals. You can also try mouthwash.   Use oral substitutes in place of cigarettes. Try using lemon drops, carrots, cinnamon sticks, or chewing gum. Keep them handy so they are available when you have the urge to smoke.  When you have the urge to smoke, try deep breathing.  Designate your home as a nonsmoking area.  If you are a heavy smoker, ask your health care provider about a prescription for nicotine chewing gum. It can ease your withdrawal from nicotine.  Reward yourself. Set aside the cigarette money you save and buy yourself something nice.  Look for support from others. Join a support group or smoking cessation program. Ask someone at home or  at work to help you with your plan to quit smoking.  Always ask yourself, "Do I need this cigarette or is this just a reflex?" Tell yourself, "Today, I choose not to smoke," or "I do not want to smoke." You are reminding yourself of your decision to quit.  Do not replace cigarette smoking with electronic cigarettes (commonly called e-cigarettes). The safety of e-cigarettes is unknown, and some may contain harmful chemicals.  If you relapse, do not give up! Plan ahead and think about what you will do the next time you get the urge to smoke. HOW WILL I FEEL WHEN I QUIT SMOKING? You may have symptoms of withdrawal because your body is used to nicotine (the addictive substance in cigarettes). You may crave cigarettes, be irritable, feel very hungry, cough often, get headaches, or have difficulty concentrating. The withdrawal symptoms are only temporary. They are strongest when you first quit but will go away within 10-14 days. When withdrawal symptoms occur, stay in control. Think about your reasons for quitting. Remind yourself that these are signs that your body is healing and getting used to being without cigarettes. Remember that withdrawal symptoms are easier to treat than the major diseases that smoking can cause.  Even after the withdrawal is over, expect periodic urges to smoke. However, these cravings are generally short lived and will go away whether you smoke or not. Do not smoke! WHAT RESOURCES ARE AVAILABLE TO HELP ME QUIT SMOKING? Your health care provider can direct you to community resources or hospitals for support, which may include:  Group support.  Education.  Hypnosis.  Therapy.   This information is not intended to replace advice given to you by your health care provider. Make sure you discuss any questions you have with your health care provider.   Document Released: 12/23/2003 Document Revised: 04/16/2014 Document Reviewed: 09/11/2012 Elsevier Interactive Patient Education  [employer]

## 2016-02-02 NOTE — Progress Notes (Signed)
Patient states he is still taking his Celexa and doing well on it

## 2016-03-29 ENCOUNTER — Encounter (HOSPITAL_COMMUNITY): Payer: Self-pay | Admitting: Emergency Medicine

## 2016-03-29 ENCOUNTER — Emergency Department (HOSPITAL_COMMUNITY): Payer: Managed Care, Other (non HMO)

## 2016-03-29 ENCOUNTER — Emergency Department (HOSPITAL_COMMUNITY)
Admission: EM | Admit: 2016-03-29 | Discharge: 2016-03-30 | Disposition: A | Discharge status: Home / Self Care | Payer: Managed Care, Other (non HMO) | Attending: Emergency Medicine | Admitting: Emergency Medicine

## 2016-03-29 DIAGNOSIS — K8500 Idiopathic acute pancreatitis without necrosis or infection: Secondary | ICD-10-CM

## 2016-03-29 DIAGNOSIS — K5900 Constipation, unspecified: Secondary | ICD-10-CM | POA: Insufficient documentation

## 2016-03-29 DIAGNOSIS — Z87891 Personal history of nicotine dependence: Secondary | ICD-10-CM | POA: Diagnosis not present

## 2016-03-29 DIAGNOSIS — R1030 Lower abdominal pain, unspecified: Secondary | ICD-10-CM | POA: Diagnosis present

## 2016-03-29 LAB — COMPREHENSIVE METABOLIC PANEL
ALK PHOS: 43 U/L (ref 38–126)
ALT: 71 U/L — AB (ref 17–63)
AST: 31 U/L (ref 15–41)
Albumin: 4.8 g/dL (ref 3.5–5.0)
Anion gap: 11 (ref 5–15)
BILIRUBIN TOTAL: 0.8 mg/dL (ref 0.3–1.2)
BUN: 13 mg/dL (ref 6–20)
CALCIUM: 10.2 mg/dL (ref 8.9–10.3)
CO2: 27 mmol/L (ref 22–32)
Chloride: 96 mmol/L — ABNORMAL LOW (ref 101–111)
Creatinine, Ser: 1 mg/dL (ref 0.61–1.24)
Glucose, Bld: 129 mg/dL — ABNORMAL HIGH (ref 65–99)
Potassium: 3.7 mmol/L (ref 3.5–5.1)
Sodium: 134 mmol/L — ABNORMAL LOW (ref 135–145)
TOTAL PROTEIN: 8.1 g/dL (ref 6.5–8.1)

## 2016-03-29 LAB — CBC
HCT: 44 % (ref 39.0–52.0)
Hemoglobin: 15.7 g/dL (ref 13.0–17.0)
MCH: 32.7 pg (ref 26.0–34.0)
MCHC: 35.7 g/dL (ref 30.0–36.0)
MCV: 91.7 fL (ref 78.0–100.0)
PLATELETS: 209 10*3/uL (ref 150–400)
RBC: 4.8 MIL/uL (ref 4.22–5.81)
RDW: 12.9 % (ref 11.5–15.5)
WBC: 11.6 10*3/uL — AB (ref 4.0–10.5)

## 2016-03-29 LAB — URINALYSIS, ROUTINE W REFLEX MICROSCOPIC
Bilirubin Urine: NEGATIVE
GLUCOSE, UA: NEGATIVE mg/dL
Hgb urine dipstick: NEGATIVE
KETONES UR: NEGATIVE mg/dL
LEUKOCYTES UA: NEGATIVE
Nitrite: NEGATIVE
PROTEIN: NEGATIVE mg/dL
Specific Gravity, Urine: 1.015 (ref 1.005–1.030)
pH: 7 (ref 5.0–8.0)

## 2016-03-29 LAB — LIPASE, BLOOD: LIPASE: 39 U/L (ref 11–51)

## 2016-03-29 MED ORDER — IOPAMIDOL (ISOVUE-300) INJECTION 61%
100.0000 mL | Freq: Once | INTRAVENOUS | Status: AC | PRN
  Administered 2016-03-29: 100 mL via INTRAVENOUS

## 2016-03-29 MED ORDER — HYDROMORPHONE HCL 1 MG/ML IJ SOLN
0.5000 mg | Freq: Once | INTRAMUSCULAR | Status: AC
  Administered 2016-03-29: 0.5 mg via INTRAVENOUS
  Filled 2016-03-29: qty 1

## 2016-03-29 MED ORDER — PROCHLORPERAZINE EDISYLATE 5 MG/ML IJ SOLN
5.0000 mg | Freq: Once | INTRAMUSCULAR | Status: AC
  Administered 2016-03-30: 5 mg via INTRAVENOUS
  Filled 2016-03-29: qty 2

## 2016-03-29 MED ORDER — HYDROMORPHONE HCL 1 MG/ML IJ SOLN
1.0000 mg | Freq: Once | INTRAMUSCULAR | Status: AC
  Administered 2016-03-30: 1 mg via INTRAVENOUS
  Filled 2016-03-29: qty 1

## 2016-03-29 MED ORDER — ONDANSETRON HCL 4 MG/2ML IJ SOLN
4.0000 mg | Freq: Once | INTRAMUSCULAR | Status: AC
  Administered 2016-03-29: 4 mg via INTRAVENOUS
  Filled 2016-03-29: qty 2

## 2016-03-29 NOTE — ED Provider Notes (Signed)
[facility]-EMERGENCY DEPT Provider Note   CSN: [id number] Arrival date & time: 03/29/16  2007     History   Chief Complaint Chief Complaint  Patient presents with  . Abdominal Pain    HPI Blake [name] is a 35 y.o. male.  Patient is a 35 year old male who presents to the emergency department with a complaint of abdominal pain.  The patient states that starting on yesterday he was having a sensation as though there was a weight or bowling ball in his mid to lower abdomen. Today he had pain that radiated around to his back. The pain today started around 2:00 PM after he had eaten chicken wings and pizza. He said initially it felt like gas. He came home and discussed it with his wife who is a Designer, jewelleryregistered nurse and she gave him some magnesium site trait. It caused him to vomit, but he did not have a bowel movement. The patient states that he had gallbladder surgery years ago and since that time he has been very regular as far as his bowels are concerned. Recently he has not been regular and he is concerned about this. It is of note that he has had gallbladder surgery, as well as to appendix surgeries. The patient denies fever or chills. Prior to this episode of pain he had not seen any blood in the stool, denies having any dark or tarry stools, did not have any unusual foul smell to his stools. He denies any injury to his back or to his abdomen recently.   The history is provided by the patient.  Abdominal Pain   Associated symptoms include nausea and constipation. Pertinent negatives include fever, dysuria, frequency, hematuria and arthralgias.    Past Medical History:  Diagnosis Date  . Depression   . High blood cholesterol level   . Sleep apnea 01/07/13   cpap    Patient Active Problem List   Diagnosis Date Noted  . Obstructive sleep apnea 10/27/2013  . Fatty liver 10/27/2013  . Hypertriglyceridemia 12/15/2012    Past Surgical History:  Procedure Laterality Date  . ABDOMINAL  SURGERY    . APPENDECTOMY    . CHOLECYSTECTOMY         Home Medications    Prior to Admission medications   Medication Sig Start Date End Date Taking? Authorizing Provider  citalopram (CELEXA) 20 MG tablet TAKE 1 TABLET DAILY (NEED OFFICE VISIT) 11/17/15  Yes Babs SciaraScott A Luking, MD  fenofibrate 160 MG tablet Take 1 tablet (160 mg total) by mouth daily. 01/30/16  Yes Babs SciaraScott A Luking, MD    Family History Family History  Problem Relation Age of Onset  . Diabetes Father     Social History Social History  Substance Use Topics  . Smoking status: Former Smoker    Quit date: 03/01/2014  . Smokeless tobacco: Never Used  . Alcohol use 0.0 oz/week     Comment: occas     Allergies   Penicillins   Review of Systems Review of Systems  Constitutional: Negative for activity change, chills, diaphoresis and fever.       All ROS Neg except as noted in HPI  HENT: Negative for nosebleeds.   Eyes: Negative for photophobia and discharge.  Respiratory: Negative for cough, shortness of breath and wheezing.   Cardiovascular: Negative for chest pain and palpitations.  Gastrointestinal: Positive for abdominal pain, constipation and nausea. Negative for blood in stool.  Genitourinary: Negative for difficulty urinating, dysuria, flank pain, frequency and hematuria.  Musculoskeletal:  Negative for arthralgias, back pain and neck pain.  Skin: Negative.   Neurological: Negative for dizziness, seizures and speech difficulty.  Psychiatric/Behavioral: Negative for confusion and hallucinations.     Physical Exam Updated Vital Signs BP (!) 167/109 (BP Location: Left Arm)   Pulse 94   Temp 99.7 F (37.6 C) (Oral)   Resp 20   Ht 5\' 11"  (1.803 m)   Wt 117.9 kg   SpO2 100%   BMI 36.26 kg/m   Physical Exam  Constitutional: He is oriented to person, place, and time. He appears well-developed and well-nourished.  Non-toxic appearance.  HENT:  Head: Normocephalic.  Right Ear: Tympanic membrane and  external ear normal.  Left Ear: Tympanic membrane and external ear normal.  There is no icterus noted in the soft palate.  Eyes: EOM and lids are normal. Pupils are equal, round, and reactive to light.  No scleral icterus appreciated.  Neck: Normal range of motion. Neck supple. Carotid bruit is not present.  Cardiovascular: Normal rate, regular rhythm, normal heart sounds, intact distal pulses and normal pulses.   Pulmonary/Chest: Breath sounds normal. No respiratory distress.  Abdominal: Soft. Bowel sounds are normal. There is no tenderness. There is no guarding.  Patient has discomfort with palpation to the abdomen diffusely, but no focal tenderness appreciated. Patient pain go up from 6/10- to 7/10 with change of position, or flexing of the psoas.  Musculoskeletal: Normal range of motion.  Lymphadenopathy:       Head (right side): No submandibular adenopathy present.       Head (left side): No submandibular adenopathy present.    He has no cervical adenopathy.  Neurological: He is alert and oriented to person, place, and time. He has normal strength. No cranial nerve deficit or sensory deficit.  Skin: Skin is warm and dry.  Psychiatric: He has a normal mood and affect. His speech is normal.  Nursing note and vitals reviewed.    ED Treatments / Results  Labs (all labs ordered are listed, but only abnormal results are displayed) Labs Reviewed  COMPREHENSIVE METABOLIC PANEL - Abnormal; Notable for the following:       Result Value   Sodium 134 (*)    Chloride 96 (*)    Glucose, Bld 129 (*)    ALT 71 (*)    All other components within normal limits  CBC - Abnormal; Notable for the following:    WBC 11.6 (*)    All other components within normal limits  LIPASE, BLOOD  URINALYSIS, ROUTINE W REFLEX MICROSCOPIC    EKG  EKG Interpretation None       Radiology No results found.  Procedures Procedures (including critical care time)  Medications Ordered in ED Medications    HYDROmorphone (DILAUDID) injection 0.5 mg (not administered)  ondansetron (ZOFRAN) injection 4 mg (not administered)     Initial Impression / Assessment and Plan / ED Course  I have reviewed the triage vital signs and the nursing notes.  Pertinent labs & imaging results that were available during my care of the patient were reviewed by me and considered in my medical decision making (see chart for details).  Clinical Course   pt seen with me by Dr Bea Laura. Wentz.  *I have reviewed nursing notes, vital signs, and all appropriate lab and imaging results for this patient.  Final Clinical Impressions(s) / ED Diagnoses  Patient is awake and alert. He does not appear to be in distress, but does state he is uncomfortable. Vital  signs show a low-grade temperature elevation of 99.7. His blood pressure is elevated at 167/109. His pulse oximetry is 100% on room air. Within normal limits by my interpretation. The competence of metabolic panel shows the sodium to be slightly low as well as the chloride, but otherwise within normal limits. The complete blood count shows white blood cells to be slightly elevated at 11.6 the remainder of the complete blood count was within normal limits. Lipase is normal at 39. Doubt ascending cholangitis.  Will obtain CT to evaluate for partial obstruction, Mass, or abnormality of biliary tree. CT scan of the abdomen shows mild all fluid and soft tissue stranding around the head of the pancreas possibly consistent with pancreatitis. There is also noted some diffuse fluid-filled small and large bowel. Patient states he is still having pain after the initial IV pain medication, a second dose of medication has been given. The patient is been evaluated with me by Dr. Effie ShyWentz. We'll discuss the case with the hospitalist for possible admission.  [facility]. Pt will be given a trial of pain med and nausea med at home. No admission at this time. Pt to return  to ED immediately if any changes or problem. Rx for percocet and zofran given to the patient. Pt encouraged to use laxative of choice over the next few days for constipation. Questions from pt and wife answered.   Final diagnoses:  None    New Prescriptions New Prescriptions   No medications on file     Ivery QualeHobson Tritia Endo, PA-C 03/30/16 0050    Mancel BaleElliott Wentz, MD 03/30/16 [phone]

## 2016-03-29 NOTE — ED Triage Notes (Signed)
Pt reports lower abdominal pain, emesis and constipation. Pt states pain began to radiate to his back today.

## 2016-03-29 NOTE — ED Provider Notes (Signed)
  Face-to-face evaluation   History: He is now for 2 days with vomiting and mid abdominal pain with abdominal bloating. He had normal stooling today. No documented fever at home. History of multiple abdominal surgeries, including appendectomy and cholecystectomy.  Physical exam: Alert, calm, cooperative. No respiratory distress. No active vomiting, as of 00:00 hours.  Medical screening examination/treatment/procedure(s) were conducted as a shared visit with non-physician practitioner(s) and myself.  I personally evaluated the patient during the encounter   Mancel BaleElliott Journei Thomassen, MD 03/30/16 [phone]

## 2016-03-30 MED ORDER — ONDANSETRON HCL 4 MG PO TABS: 4.0000 mg | ORAL_TABLET | Freq: Four times a day (QID) | ORAL | 0 refills | Status: DC

## 2016-03-30 MED ORDER — OXYCODONE-ACETAMINOPHEN 5-325 MG PO TABS
1.0000 | ORAL_TABLET | Freq: Four times a day (QID) | ORAL | 0 refills | Status: DC | PRN
Start: 2016-03-30 — End: 2016-08-16

## 2016-03-30 MED ORDER — OXYCODONE-ACETAMINOPHEN 5-325 MG PO TABS: 1.0000 | ORAL_TABLET | ORAL | 0 refills | Status: DC | PRN

## 2016-03-30 NOTE — Discharge Instructions (Signed)
Your CT scan suggest pancreatitis. Please use percocet and zofran for pain and nausea. Your have a history of constipation. Please use laxative of choice over the next 2 or 3 days. Please return to the Emergency Dept immediately if any changes, problem, or concern. Please call Dr Jena Gaussourk for GI evaluation of your pancreatitis. And abd pain in the office.

## 2016-04-03 ENCOUNTER — Encounter: Payer: Self-pay | Admitting: Family Medicine

## 2016-04-03 ENCOUNTER — Ambulatory Visit (INDEPENDENT_AMBULATORY_CARE_PROVIDER_SITE_OTHER): Payer: Managed Care, Other (non HMO) | Admitting: Family Medicine

## 2016-04-03 VITALS — BP 132/82 | Ht 71.0 in | Wt 263.8 lb

## 2016-04-03 DIAGNOSIS — E781 Pure hyperglyceridemia: Secondary | ICD-10-CM | POA: Diagnosis not present

## 2016-04-03 DIAGNOSIS — K858 Other acute pancreatitis without necrosis or infection: Secondary | ICD-10-CM | POA: Diagnosis not present

## 2016-04-03 DIAGNOSIS — R7303 Prediabetes: Secondary | ICD-10-CM

## 2016-04-03 DIAGNOSIS — K760 Fatty (change of) liver, not elsewhere classified: Secondary | ICD-10-CM

## 2016-04-03 NOTE — Progress Notes (Signed)
   Subjective:    Patient ID: Blake [name], male    DOB: [birth year], 35 y.o.   MRN: [id number]  HPI  Patient arrives for a follow up from a recent ER visit for pancreatitis. This patient has history of elevated triglycerides. Recently he has been drinking a few beers at night and sometimes mixed drinks. In addition to this he has not been exercising as much as he should know receive an watching his diet as well. He is never had pancreatitis before went to the ER with abdominal pain had a CAT scan lab work showed slight elevation of lipase rest of the lab work looked good. Review of Systems  Constitutional: Positive for fatigue. Negative for fever.  HENT: Negative for congestion.   Respiratory: Negative for cough.   Cardiovascular: Negative for chest pain.  Gastrointestinal: Positive for abdominal pain.  Genitourinary: Negative for dysuria.  Musculoskeletal: Positive for back pain.       Objective:   Physical Exam  Constitutional: He appears well-nourished. No distress.  Cardiovascular: Normal rate, regular rhythm and normal heart sounds.   No murmur heard. Pulmonary/Chest: Effort normal and breath sounds normal. No respiratory distress.  Musculoskeletal: He exhibits no edema.  Lymphadenopathy:    He has no cervical adenopathy.  Neurological: He is alert.  Psychiatric: His behavior is normal.  Vitals reviewed.         Assessment & Plan:  Pancreatitis Probably associated with alcohol intake as well as elevated triglycerides He will do his lab work this coming weekend He will avoid alcohol Minimize starches Excise try to lose weight May need to go on additional medicine Await the lab tests Patient aware of warning signs regarding pancreatitis.

## 2016-04-04 MED FILL — Oxycodone w/ Acetaminophen Tab 5-325 MG: ORAL | Qty: 6 | Status: AC

## 2016-04-08 LAB — LIPID PANEL
CHOLESTEROL TOTAL: 244 mg/dL — AB (ref 100–199)
Chol/HDL Ratio: 8.7 ratio units — ABNORMAL HIGH (ref 0.0–5.0)
HDL: 28 mg/dL — ABNORMAL LOW (ref >39)
TRIGLYCERIDES: 683 mg/dL — AB (ref 0–149)

## 2016-04-08 LAB — HEPATIC FUNCTION PANEL
ALBUMIN: 4.6 g/dL (ref 3.5–5.5)
ALT: 62 IU/L — AB (ref 0–44)
AST: 32 IU/L (ref 0–40)
Alkaline Phosphatase: 65 IU/L (ref 39–117)
BILIRUBIN, DIRECT: 0.1 mg/dL (ref 0.00–0.40)
Bilirubin Total: 0.2 mg/dL (ref 0.0–1.2)
Total Protein: 7 g/dL (ref 6.0–8.5)

## 2016-04-08 LAB — HEMOGLOBIN A1C
ESTIMATED AVERAGE GLUCOSE: 157 mg/dL
Hgb A1c MFr Bld: 7.1 % — ABNORMAL HIGH (ref 4.8–5.6)

## 2016-04-08 LAB — LIPASE: Lipase: 54 U/L (ref 13–78)

## 2016-04-11 ENCOUNTER — Encounter: Payer: Self-pay | Admitting: Family Medicine

## 2016-04-11 DIAGNOSIS — Z029 Encounter for administrative examinations, unspecified: Secondary | ICD-10-CM

## 2016-04-17 ENCOUNTER — Encounter: Payer: Self-pay | Admitting: Family Medicine

## 2016-05-01 ENCOUNTER — Ambulatory Visit: Payer: Managed Care, Other (non HMO) | Admitting: Family Medicine

## 2016-05-18 ENCOUNTER — Encounter: Payer: Self-pay | Admitting: Family Medicine

## 2016-05-18 ENCOUNTER — Ambulatory Visit (INDEPENDENT_AMBULATORY_CARE_PROVIDER_SITE_OTHER): Payer: Managed Care, Other (non HMO) | Admitting: Family Medicine

## 2016-05-18 VITALS — BP 132/82 | Ht 71.0 in | Wt 262.2 lb

## 2016-05-18 DIAGNOSIS — K760 Fatty (change of) liver, not elsewhere classified: Secondary | ICD-10-CM | POA: Diagnosis not present

## 2016-05-18 DIAGNOSIS — E6609 Other obesity due to excess calories: Secondary | ICD-10-CM

## 2016-05-18 DIAGNOSIS — E119 Type 2 diabetes mellitus without complications: Secondary | ICD-10-CM

## 2016-05-18 DIAGNOSIS — Z6837 Body mass index (BMI) 37.0-37.9, adult: Secondary | ICD-10-CM | POA: Diagnosis not present

## 2016-05-18 DIAGNOSIS — Z79899 Other long term (current) drug therapy: Secondary | ICD-10-CM

## 2016-05-18 DIAGNOSIS — IMO0001 Reserved for inherently not codable concepts without codable children: Secondary | ICD-10-CM

## 2016-05-18 DIAGNOSIS — E785 Hyperlipidemia, unspecified: Secondary | ICD-10-CM

## 2016-05-18 MED ORDER — METFORMIN HCL 500 MG PO TABS: 500.0000 mg | ORAL_TABLET | Freq: Two times a day (BID) | ORAL | 0 refills | Status: DC

## 2016-05-18 MED ORDER — METFORMIN HCL 500 MG PO TABS: 500.0000 mg | ORAL_TABLET | Freq: Two times a day (BID) | ORAL | 1 refills | Status: DC

## 2016-05-18 NOTE — Progress Notes (Signed)
   Subjective:    Patient ID: Blake [name], male    DOB: 20-Jul-1980, 36 y.o.   MRN: [id number]  Hyperlipidemia  This is a chronic problem. The current episode started more than 1 year ago. There are no known factors aggravating his hyperlipidemia. Treatments tried: fenofibrate. The current treatment provides moderate improvement of lipids. There are no compliance problems.  There are no known risk factors for coronary artery disease.   Patient has no concerns at this time.  We had a long discussion. Patient not watching how he eats. Not exercising. Has put weight on. Has cut out out call use during the week but does mild to moderate amount of drinking on the weekends. He denies high fever chills sweats. Denies dysuria hematuria rectal bleeding. Denies chest pressure tightness wheezing shortness of breath.   Review of Systems Please see above    Objective:   Physical Exam Neck no masses lungs clear no crackles heart regular abdomen soft moderately obesity   25 minutes was spent with the patient. Greater than half the time was spent in discussion and answering questions and counseling regarding the issues that the patient came in for today. The importance of getting his weight, diabetes, hyperlipidemia under control was stressed to lessen the risk of heart attack strokes and early death    Assessment & Plan:  Severe lipidemia-continue fenofibrate daily. Add flaxseed. Get diabetes under control eliminate all alcohol cleanup diet  Diabetes start metformin titrate up to 500 mg twice a day send us some readings in the next few weeks-We talked about medication choices we will start metformin if he does not tolerate this we will try other oral medication or possible injections such as Victoza or similar  Fatty liver stable  Moderate obesity with comorbidities consider gastric sleeve if unable to lose weight on his own  Patient encouraged to cleanup diet become more active lose weight  eliminate all alcohol and follow-up for diabetic education  Follow-up in 3 months lab work before that visit

## 2016-05-19 DIAGNOSIS — E669 Obesity, unspecified: Secondary | ICD-10-CM | POA: Insufficient documentation

## 2016-05-21 ENCOUNTER — Encounter: Payer: Self-pay | Admitting: Family Medicine

## 2016-06-05 ENCOUNTER — Ambulatory Visit: Payer: Managed Care, Other (non HMO) | Admitting: Nutrition

## 2016-07-15 ENCOUNTER — Other Ambulatory Visit: Payer: Self-pay | Admitting: Family Medicine

## 2016-08-16 ENCOUNTER — Encounter: Payer: Self-pay | Admitting: Family Medicine

## 2016-08-16 ENCOUNTER — Ambulatory Visit (INDEPENDENT_AMBULATORY_CARE_PROVIDER_SITE_OTHER): Payer: Managed Care, Other (non HMO) | Admitting: Family Medicine

## 2016-08-16 VITALS — BP 130/80 | Ht 71.0 in | Wt 252.4 lb

## 2016-08-16 DIAGNOSIS — E781 Pure hyperglyceridemia: Secondary | ICD-10-CM | POA: Diagnosis not present

## 2016-08-16 DIAGNOSIS — E119 Type 2 diabetes mellitus without complications: Secondary | ICD-10-CM | POA: Diagnosis not present

## 2016-08-16 LAB — MICROALBUMIN / CREATININE URINE RATIO
Creatinine, Urine: 30.4 mg/dL
Microalbumin, Urine: <3 ug/mL

## 2016-08-16 LAB — BASIC METABOLIC PANEL
BUN / CREAT RATIO: 14 (ref 9–20)
BUN: 12 mg/dL (ref 6–20)
CO2: 23 mmol/L (ref 18–29)
Calcium: 9.9 mg/dL (ref 8.7–10.2)
Chloride: 96 mmol/L (ref 96–106)
Creatinine, Ser: 0.87 mg/dL (ref 0.76–1.27)
GFR, EST AFRICAN AMERICAN: 131 mL/min/{1.73_m2} (ref >59)
GFR, EST NON AFRICAN AMERICAN: 113 mL/min/{1.73_m2} (ref >59)
Glucose: 98 mg/dL (ref 65–99)
POTASSIUM: 4.7 mmol/L (ref 3.5–5.2)
SODIUM: 138 mmol/L (ref 134–144)

## 2016-08-16 LAB — HEMOGLOBIN A1C
Est. average glucose Bld gHb Est-mCnc: 123 mg/dL
Hgb A1c MFr Bld: 5.9 % — ABNORMAL HIGH (ref 4.8–5.6)

## 2016-08-16 LAB — LIPID PANEL
CHOLESTEROL TOTAL: 277 mg/dL — AB (ref 100–199)
Chol/HDL Ratio: 7.7 ratio — ABNORMAL HIGH (ref 0.0–5.0)
HDL: 36 mg/dL — ABNORMAL LOW (ref >39)
TRIGLYCERIDES: 510 mg/dL — AB (ref 0–149)

## 2016-08-16 LAB — HEPATIC FUNCTION PANEL
ALBUMIN: 5 g/dL (ref 3.5–5.5)
ALK PHOS: 57 IU/L (ref 39–117)
ALT: 57 IU/L — ABNORMAL HIGH (ref 0–44)
AST: 27 IU/L (ref 0–40)
BILIRUBIN TOTAL: 0.3 mg/dL (ref 0.0–1.2)
Bilirubin, Direct: 0.11 mg/dL (ref 0.00–0.40)
Total Protein: 7.8 g/dL (ref 6.0–8.5)

## 2016-08-16 MED ORDER — ATORVASTATIN CALCIUM 20 MG PO TABS: 20.0000 mg | ORAL_TABLET | Freq: Every day | ORAL | 5 refills | Status: DC

## 2016-08-16 MED ORDER — CITALOPRAM HYDROBROMIDE 20 MG PO TABS: ORAL_TABLET | ORAL | 5 refills | Status: DC

## 2016-08-16 MED ORDER — METFORMIN HCL 500 MG PO TABS: 500.0000 mg | ORAL_TABLET | Freq: Two times a day (BID) | ORAL | 1 refills | Status: DC

## 2016-08-16 NOTE — Progress Notes (Signed)
   Subjective:    Patient ID: Blake [name], male    DOB: 1981/03/21, 36 y.o.   MRN: [id number]  Diabetes  He presents for his follow-up diabetic visit. He has type 2 diabetes mellitus. No MedicAlert identification noted. Pertinent negatives for hypoglycemia include no confusion. Pertinent negatives for diabetes include no chest pain, no fatigue, no polydipsia, no polyphagia and no weakness. Eye exam is not current.   Patient states no other concerns this visit.  Patient is taking his fenofibrate denies any problem with it He is taking his metformin is try to watch his diet He has lost some weight. He is cut out alcohol. He does not smoke. He states he could be doing better on his diet. Still suffers with obesity.  Review of Systems  Constitutional: Negative for activity change, appetite change and fatigue.  HENT: Negative for congestion.   Respiratory: Negative for cough.   Cardiovascular: Negative for chest pain.  Gastrointestinal: Negative for abdominal pain.  Endocrine: Negative for polydipsia and polyphagia.  Neurological: Negative for weakness.  Psychiatric/Behavioral: Negative for confusion.       Objective:   Physical Exam  Constitutional: He appears well-nourished. No distress.  Cardiovascular: Normal rate, regular rhythm and normal heart sounds.   No murmur heard. Pulmonary/Chest: Effort normal and breath sounds normal. No respiratory distress.  Musculoskeletal: He exhibits no edema.  Lymphadenopathy:    He has no cervical adenopathy.  Neurological: He is alert.  Psychiatric: His behavior is normal.  Vitals reviewed.         Assessment & Plan:  Hypertriglyceridemia stop fenofibrate. Start Lipitor Recheck lab work in 8-10 weeks Diabetes under good control Continue to work hard at losing weight and exercising. Follow-up in approximately 4 months to 6 months Patient will do lab work in 8-10 weeks we will notify him of the results

## 2016-11-21 ENCOUNTER — Encounter: Payer: Self-pay | Admitting: Family Medicine

## 2016-11-21 ENCOUNTER — Ambulatory Visit (INDEPENDENT_AMBULATORY_CARE_PROVIDER_SITE_OTHER): Payer: Managed Care, Other (non HMO) | Admitting: Family Medicine

## 2016-11-21 VITALS — BP 130/76 | Temp 98.5°F | Ht 71.0 in | Wt 266.4 lb

## 2016-11-21 DIAGNOSIS — F320 Major depressive disorder, single episode, mild: Secondary | ICD-10-CM | POA: Insufficient documentation

## 2016-11-21 DIAGNOSIS — F1010 Alcohol abuse, uncomplicated: Secondary | ICD-10-CM | POA: Diagnosis not present

## 2016-11-21 DIAGNOSIS — K2900 Acute gastritis without bleeding: Secondary | ICD-10-CM

## 2016-11-21 MED ORDER — ESCITALOPRAM OXALATE 20 MG PO TABS: 20.0000 mg | ORAL_TABLET | Freq: Every day | ORAL | 5 refills | Status: DC

## 2016-11-21 MED ORDER — PANTOPRAZOLE SODIUM 40 MG PO TBEC: 40.0000 mg | DELAYED_RELEASE_TABLET | Freq: Every day | ORAL | 3 refills | Status: DC

## 2016-11-21 MED ORDER — SUCRALFATE 1 G PO TABS: ORAL_TABLET | ORAL | 0 refills | Status: DC

## 2016-11-21 NOTE — Progress Notes (Signed)
   Subjective:    Patient ID: Blake [name], male    DOB: 11-04-1980, 36 y.o.   MRN: [id number] Patient arrives office with numerous concerns Abdominal Pain  This is a new problem. The current episode started in the past 7 days. The problem has been gradually improving. The abdominal pain radiates to the RUQ. Associated symptoms include diarrhea.   Patient also has concerns of depression, alcoholism and smoking. Patient states he would like to quit smoking.  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   Review of Systems  Gastrointestinal: Positive for abdominal pain and diarrhea.       Objective:   Physical Exam  Alert and oriented, vitals reviewed and stable, NAD ENT-TM's and ext canals WNL bilat via otoscopic exam Soft palate, tonsils and post pharynx WNL via oropharyngeal exam Neck-symmetric, no masses; thyroid nonpalpable and nontender Pulmonary-no tachypnea or accessory muscle use; Clear without wheezes via auscultation Card--no abnrml murmurs, rhythm reg and rate WNL Carotid pulses symmetric, without bruits Epigastric tenderness noted excellent bowel sounds no acute distress no rebound no guarding      Assessment & Plan:  Impression gastritis versus pancreatitis review of old records show the episode of "pancreatitis" the pancreas enzymes were normal. Discusses could well represent gastritis #2 depression worsening per  patient will change over to Lexapro rationale discussed also needs mental health referral #3 substance abuse related to #2 primarily alcohol. Needs specific counseling in this regard  Greater than 50% of this 25 minute face to face visit was spent in counseling and discussion and coordination of care regarding the above diagnosis/diagnosies  Will give trial protonic pump inhibitor patient cut down alcohol follow-up with Dr. Lorin PicketScott in month follow up on abdominal pain and smoking habit. Referral primarily for substance abuse alcoholism specifically and depression

## 2016-11-21 NOTE — Addendum Note (Signed)
Addended by: Merlyn AlbertLUKING, Jamarii Banks S on: 11/21/2016 01:17 PM   Modules accepted: Orders

## 2016-11-22 DIAGNOSIS — Z029 Encounter for administrative examinations, unspecified: Secondary | ICD-10-CM

## 2016-11-27 ENCOUNTER — Telehealth: Payer: Self-pay | Admitting: Family Medicine

## 2016-11-27 NOTE — Telephone Encounter (Signed)
(  MESSAGE FOR DR.STEVE) patient dropped off FMLA to be filled out . Please review form,date sign. IN your yellow folder.

## 2016-11-28 ENCOUNTER — Encounter: Payer: Self-pay | Admitting: Family Medicine

## 2016-12-14 ENCOUNTER — Ambulatory Visit: Payer: Managed Care, Other (non HMO) | Admitting: Family Medicine

## 2016-12-31 ENCOUNTER — Other Ambulatory Visit: Payer: Self-pay | Admitting: Family Medicine

## 2017-01-03 ENCOUNTER — Ambulatory Visit: Payer: Managed Care, Other (non HMO) | Admitting: Family Medicine

## 2017-01-05 ENCOUNTER — Other Ambulatory Visit: Payer: Self-pay | Admitting: Family Medicine

## 2017-01-11 ENCOUNTER — Ambulatory Visit: Payer: Managed Care, Other (non HMO) | Admitting: Family Medicine

## 2017-01-17 ENCOUNTER — Ambulatory Visit: Payer: Managed Care, Other (non HMO) | Admitting: Family Medicine

## 2017-01-17 DIAGNOSIS — Z029 Encounter for administrative examinations, unspecified: Secondary | ICD-10-CM

## 2017-02-01 ENCOUNTER — Encounter: Payer: Self-pay | Admitting: Family Medicine

## 2017-02-13 DIAGNOSIS — Z029 Encounter for administrative examinations, unspecified: Secondary | ICD-10-CM

## 2017-02-14 ENCOUNTER — Ambulatory Visit (INDEPENDENT_AMBULATORY_CARE_PROVIDER_SITE_OTHER): Payer: Managed Care, Other (non HMO) | Admitting: Family Medicine

## 2017-02-14 ENCOUNTER — Encounter: Payer: Self-pay | Admitting: Family Medicine

## 2017-02-14 VITALS — BP 128/82 | Ht 71.0 in | Wt 269.0 lb

## 2017-02-14 DIAGNOSIS — K760 Fatty (change of) liver, not elsewhere classified: Secondary | ICD-10-CM | POA: Diagnosis not present

## 2017-02-14 DIAGNOSIS — Z6837 Body mass index (BMI) 37.0-37.9, adult: Secondary | ICD-10-CM

## 2017-02-14 DIAGNOSIS — E781 Pure hyperglyceridemia: Secondary | ICD-10-CM | POA: Diagnosis not present

## 2017-02-14 DIAGNOSIS — Z23 Encounter for immunization: Secondary | ICD-10-CM | POA: Diagnosis not present

## 2017-02-14 LAB — HEPATIC FUNCTION PANEL
ALBUMIN: 4.7 g/dL (ref 3.5–5.5)
ALT: 67 IU/L — ABNORMAL HIGH (ref 0–44)
AST: 35 IU/L (ref 0–40)
Alkaline Phosphatase: 62 IU/L (ref 39–117)
Bilirubin Total: 0.3 mg/dL (ref 0.0–1.2)
Bilirubin, Direct: 0.16 mg/dL (ref 0.00–0.40)
TOTAL PROTEIN: 7.1 g/dL (ref 6.0–8.5)

## 2017-02-14 LAB — LIPID PANEL
Chol/HDL Ratio: 13 ratio — ABNORMAL HIGH (ref 0.0–5.0)
Cholesterol, Total: 299 mg/dL — ABNORMAL HIGH (ref 100–199)
HDL: 23 mg/dL — ABNORMAL LOW (ref >39)
Triglycerides: 1452 mg/dL (ref 0–149)

## 2017-02-14 MED ORDER — ATORVASTATIN CALCIUM 40 MG PO TABS: 40.0000 mg | ORAL_TABLET | Freq: Every day | ORAL | 5 refills | Status: DC

## 2017-02-14 MED ORDER — ATORVASTATIN CALCIUM 40 MG PO TABS: 40.0000 mg | ORAL_TABLET | Freq: Every day | ORAL | 1 refills | Status: DC

## 2017-02-14 NOTE — Progress Notes (Signed)
   Subjective:    Patient ID: Blake [name], male    DOB: 1980-07-08, 36 y.o.   MRN: [id number]  Hyperlipidemia  This is a chronic problem. The current episode started more than 1 year ago. Pertinent negatives include no chest pain or shortness of breath. Risk factors for coronary artery disease include dyslipidemia (fenofibrate).   FMLA increase the occureneces-the patient states he has at least 2 occurrences a month on some months where he is unable to go to work because of flareup of combination of bowel issues a.m. stress related issues and nausea vomiting  Stopped lipitor when e started fenofibrate-did not realize to do both meds.  The patient was understood on previous directions not taking his medicine the way showed  He does suffer with obesity does not eat healthy does not exercise regular basis BMI staying at 37  He states his moods are doing somewhat better denies being depressed just a lot of stress he would like to get a different job  Alcohol abuse patient intermittently uses too much alcohol on a regular basis he understands the importance of quitting alcohol  Patient smokes he knows he should quit he would like to try Chantix but he understands with his other issues but this is not an option currently he states Wellbutrin did not help Review of Systems  Constitutional: Negative for activity change.  HENT: Negative for congestion.   Respiratory: Negative for cough and shortness of breath.   Cardiovascular: Negative for chest pain and leg swelling.  Gastrointestinal: Negative for abdominal pain and vomiting.  Musculoskeletal: Negative for arthralgias and back pain.  Neurological: Negative for weakness.  Psychiatric/Behavioral: Negative for agitation, behavioral problems and confusion.       Objective:   Physical Exam HEENT benign neck no masses lungs clear no crackles heart regular no murmurs pulses normal extremities no edema skin warm dry neurologic grossly   25  minutes was spent with the patient. Greater than half the time was spent in discussion and answering questions and counseling regarding the issues that the patient came in for today.     Assessment & Plan:  Overuse of alcohol-patient feels he can quit this  Fatty liver this is a significant concern reduction await watching diet  Severe hypertriglyceridemia puts him at risk of pancreatitis increase statin dose continue fenofibrate warning signs regarding interactions and side effects discussed  Patient encouraged to minimize starches in the diet continue metformin and encouraged to do his lab work in 8-10 weeks with a follow-up visit after that  Patient smokes he is to cut back and quit but the most important thing for him to do currently is to quit alcohol first. We will work on trying to get him set up with mental health apparently information was sent to mental health the patient never received any information back  Patient may benefit from gastric bypass or gastric sleeve but it is very important for him to get away from alcohol fi andrst

## 2017-02-18 ENCOUNTER — Telehealth: Payer: Self-pay | Admitting: Family Medicine

## 2017-02-18 NOTE — Telephone Encounter (Signed)
Patient brought in FMLA at his visit on 11/8 to be updated.Please review form and fill out highlighted  Areas.Date and sign in your yellow folder.

## 2017-02-20 NOTE — Telephone Encounter (Signed)
His form was filled out- in addition to this I did make greater allowance for him to have more frequent episodes because patient states it flares up more often.  Obviously he is trying to get things under better control.

## 2017-04-07 ENCOUNTER — Other Ambulatory Visit: Payer: Self-pay | Admitting: Family Medicine

## 2017-05-13 ENCOUNTER — Telehealth: Payer: Self-pay | Admitting: Family Medicine

## 2017-05-13 NOTE — Telephone Encounter (Signed)
Please remind patient he has appointment on February 12.  Very important for the patient to do his lab work before this visit.  It is already in the system.

## 2017-05-14 NOTE — Telephone Encounter (Signed)
I called and left a message to r/c. 

## 2017-05-14 NOTE — Telephone Encounter (Signed)
Patient is aware 

## 2017-05-17 ENCOUNTER — Ambulatory Visit: Payer: Managed Care, Other (non HMO) | Admitting: Family Medicine

## 2017-05-21 ENCOUNTER — Ambulatory Visit (INDEPENDENT_AMBULATORY_CARE_PROVIDER_SITE_OTHER): Payer: Managed Care, Other (non HMO) | Admitting: Family Medicine

## 2017-05-21 ENCOUNTER — Encounter: Payer: Self-pay | Admitting: Family Medicine

## 2017-05-21 VITALS — BP 132/88 | Ht 71.0 in | Wt 262.0 lb

## 2017-05-21 DIAGNOSIS — K760 Fatty (change of) liver, not elsewhere classified: Secondary | ICD-10-CM

## 2017-05-21 DIAGNOSIS — R7989 Other specified abnormal findings of blood chemistry: Secondary | ICD-10-CM

## 2017-05-21 DIAGNOSIS — E7849 Other hyperlipidemia: Secondary | ICD-10-CM

## 2017-05-21 DIAGNOSIS — E119 Type 2 diabetes mellitus without complications: Secondary | ICD-10-CM

## 2017-05-21 LAB — HEPATIC FUNCTION PANEL
ALBUMIN: 4.7 g/dL (ref 3.5–5.5)
ALT: 68 IU/L — AB (ref 0–44)
AST: 37 IU/L (ref 0–40)
Alkaline Phosphatase: 42 IU/L (ref 39–117)
BILIRUBIN, DIRECT: 0.19 mg/dL (ref 0.00–0.40)
Bilirubin Total: 0.5 mg/dL (ref 0.0–1.2)
TOTAL PROTEIN: 6.8 g/dL (ref 6.0–8.5)

## 2017-05-21 LAB — LIPID PANEL
Chol/HDL Ratio: 5.2 ratio — ABNORMAL HIGH (ref 0.0–5.0)
Cholesterol, Total: 145 mg/dL (ref 100–199)
HDL: 28 mg/dL — AB (ref >39)
LDL Calculated: 40 mg/dL (ref 0–99)
Triglycerides: 386 mg/dL — ABNORMAL HIGH (ref 0–149)
VLDL CHOLESTEROL CAL: 77 mg/dL — AB (ref 5–40)

## 2017-05-21 LAB — FERRITIN: Ferritin: 460 ng/mL — ABNORMAL HIGH (ref 30–400)

## 2017-05-21 LAB — BASIC METABOLIC PANEL
BUN/Creatinine Ratio: 9 (ref 9–20)
BUN: 10 mg/dL (ref 6–20)
CALCIUM: 9.5 mg/dL (ref 8.7–10.2)
CO2: 23 mmol/L (ref 20–29)
Chloride: 99 mmol/L (ref 96–106)
Creatinine, Ser: 1.06 mg/dL (ref 0.76–1.27)
GFR calc Af Amer: 105 mL/min/{1.73_m2} (ref >59)
GFR, EST NON AFRICAN AMERICAN: 91 mL/min/{1.73_m2} (ref >59)
Glucose: 110 mg/dL — ABNORMAL HIGH (ref 65–99)
POTASSIUM: 4.5 mmol/L (ref 3.5–5.2)
Sodium: 138 mmol/L (ref 134–144)

## 2017-05-21 LAB — HEMOGLOBIN A1C
ESTIMATED AVERAGE GLUCOSE: 148 mg/dL
Hgb A1c MFr Bld: 6.8 % — ABNORMAL HIGH (ref 4.8–5.6)

## 2017-05-21 MED ORDER — METFORMIN HCL 500 MG PO TABS: 500.0000 mg | ORAL_TABLET | Freq: Two times a day (BID) | ORAL | 1 refills | Status: DC

## 2017-05-21 MED ORDER — FENOFIBRATE 160 MG PO TABS: 160.0000 mg | ORAL_TABLET | Freq: Every day | ORAL | 1 refills | Status: DC

## 2017-05-21 NOTE — Patient Instructions (Signed)
Diabetes Mellitus and Nutrition When you have diabetes (diabetes mellitus), it is very important to have healthy eating habits because your blood sugar (glucose) levels are greatly affected by what you eat and drink. Eating healthy foods in the appropriate amounts, at about the same times every day, can help you:  Control your blood glucose.  Lower your risk of heart disease.  Improve your blood pressure.  Reach or maintain a healthy weight.  Every person with diabetes is different, and each person has different needs for a meal plan. Your health care provider may recommend that you work with a diet and nutrition specialist (dietitian) to make a meal plan that is best for you. Your meal plan may vary depending on factors such as:  The calories you need.  The medicines you take.  Your weight.  Your blood glucose, blood pressure, and cholesterol levels.  Your activity level.  Other health conditions you have, such as heart or kidney disease.  How do carbohydrates affect me? Carbohydrates affect your blood glucose level more than any other type of food. Eating carbohydrates naturally increases the amount of glucose in your blood. Carbohydrate counting is a method for keeping track of how many carbohydrates you eat. Counting carbohydrates is important to keep your blood glucose at a healthy level, especially if you use insulin or take certain oral diabetes medicines. It is important to know how many carbohydrates you can safely have in each meal. This is different for every person. Your dietitian can help you calculate how many carbohydrates you should have at each meal and for snack. Foods that contain carbohydrates include:  Bread, cereal, rice, pasta, and crackers.  Potatoes and corn.  Peas, beans, and lentils.  Milk and yogurt.  Fruit and juice.  Desserts, such as cakes, cookies, ice cream, and candy.  How does alcohol affect me? Alcohol can cause a sudden decrease in blood  glucose (hypoglycemia), especially if you use insulin or take certain oral diabetes medicines. Hypoglycemia can be a life-threatening condition. Symptoms of hypoglycemia (sleepiness, dizziness, and confusion) are similar to symptoms of having too much alcohol. If your health care provider says that alcohol is safe for you, follow these guidelines:  Limit alcohol intake to no more than 1 drink per day for nonpregnant women and 2 drinks per day for men. One drink equals 12 oz of beer, 5 oz of wine, or 1 oz of hard liquor.  Do not drink on an empty stomach.  Keep yourself hydrated with water, diet soda, or unsweetened iced tea.  Keep in mind that regular soda, juice, and other mixers may contain a lot of sugar and must be counted as carbohydrates.  What are tips for following this plan? Reading food labels  Start by checking the serving size on the label. The amount of calories, carbohydrates, fats, and other nutrients listed on the label are based on one serving of the food. Many foods contain more than one serving per package.  Check the total grams (g) of carbohydrates in one serving. You can calculate the number of servings of carbohydrates in one serving by dividing the total carbohydrates by 15. For example, if a food has 30 g of total carbohydrates, it would be equal to 2 servings of carbohydrates.  Check the number of grams (g) of saturated and trans fats in one serving. Choose foods that have low or no amount of these fats.  Check the number of milligrams (mg) of sodium in one serving. Most people   should limit total sodium intake to less than 2,300 mg per day.  Always check the nutrition information of foods labeled as "low-fat" or "nonfat". These foods may be higher in added sugar or refined carbohydrates and should be avoided.  Talk to your dietitian to identify your daily goals for nutrients listed on the label. Shopping  Avoid buying canned, premade, or processed foods. These  foods tend to be high in fat, sodium, and added sugar.  Shop around the outside edge of the grocery store. This includes fresh fruits and vegetables, bulk grains, fresh meats, and fresh dairy. Cooking  Use low-heat cooking methods, such as baking, instead of high-heat cooking methods like deep frying.  Cook using healthy oils, such as olive, canola, or sunflower oil.  Avoid cooking with butter, cream, or high-fat meats. Meal planning  Eat meals and snacks regularly, preferably at the same times every day. Avoid going long periods of time without eating.  Eat foods high in fiber, such as fresh fruits, vegetables, beans, and whole grains. Talk to your dietitian about how many servings of carbohydrates you can eat at each meal.  Eat 4-6 ounces of lean protein each day, such as lean meat, chicken, fish, eggs, or tofu. 1 ounce is equal to 1 ounce of meat, chicken, or fish, 1 egg, or 1/4 cup of tofu.  Eat some foods each day that contain healthy fats, such as avocado, nuts, seeds, and fish. Lifestyle   Check your blood glucose regularly.  Exercise at least 30 minutes 5 or more days each week, or as told by your health care provider.  Take medicines as told by your health care provider.  Do not use any products that contain nicotine or tobacco, such as cigarettes and e-cigarettes. If you need help quitting, ask your health care provider.  Work with a counselor or diabetes educator to identify strategies to manage stress and any emotional and social challenges. What are some questions to ask my health care provider?  Do I need to meet with a diabetes educator?  Do I need to meet with a dietitian?  What number can I call if I have questions?  When are the best times to check my blood glucose? Where to find more information:  American Diabetes Association: diabetes.org/food-and-fitness/food  Academy of Nutrition and Dietetics:  www.eatright.org/resources/health/diseases-and-conditions/diabetes  National Institute of Diabetes and Digestive and Kidney Diseases (NIH): www.niddk.nih.gov/health-information/diabetes/overview/diet-eating-physical-activity Summary  A healthy meal plan will help you control your blood glucose and maintain a healthy lifestyle.  Working with a diet and nutrition specialist (dietitian) can help you make a meal plan that is best for you.  Keep in mind that carbohydrates and alcohol have immediate effects on your blood glucose levels. It is important to count carbohydrates and to use alcohol carefully. This information is not intended to replace advice given to you by your health care provider. Make sure you discuss any questions you have with your health care provider. Document Released: 12/21/2004 Document Revised: 04/30/2016 Document Reviewed: 04/30/2016 Elsevier Interactive Patient Education  2018 Elsevier Inc.  

## 2017-05-21 NOTE — Progress Notes (Addendum)
   Subjective:    Patient ID: Blake [name], male    DOB: 11-21-1980, 37 y.o.   MRN: [id number]  HPI Patient is here today to follow up on Dm. He takes Metformin 500 mg BID.His recent a1c was done yesterday at 6.8.He does not eat healthy or exercise. The patient was seen today as part of a comprehensive diabetic check up.The patient relates medication compliance. No significant side effects to the medications. Denies any low glucose spells. Relates compliance with diet to a reasonable level. Patient does do labwork intermittently and understands the dangers of diabetes.  Patient here for follow-up regarding cholesterol.  Patient does try to maintain a reasonable diet.  Patient does take the medication on a regular basis.  Denies missing a dose.  The patient denies any obvious side effects.  Prior blood work results reviewed with the patient.  The patient is aware of his cholesterol goals and the need to keep it under good control to lessen the risk of disease.  Patient also has elevated liver enzymes fatty liver we discussed losing weight at length also discussed quitting smoking cutting back on alcohol and further looking into his liver enzyme issues  Review of Systems  Constitutional: Negative for activity change, appetite change and fatigue.  HENT: Negative for congestion and rhinorrhea.   Respiratory: Negative for cough, chest tightness and shortness of breath.   Cardiovascular: Negative for chest pain and leg swelling.  Gastrointestinal: Negative for abdominal pain, diarrhea and nausea.  Endocrine: Negative for polydipsia and polyphagia.  Genitourinary: Negative for dysuria and hematuria.  Neurological: Negative for weakness and headaches.  Psychiatric/Behavioral: Negative for confusion and dysphoric mood.       Objective:   Physical Exam  Constitutional: He appears well-nourished. No distress.  HENT:  Head: Normocephalic and atraumatic.  Eyes: Right eye exhibits no discharge.  Left eye exhibits no discharge.  Neck: No tracheal deviation present.  Cardiovascular: Normal rate, regular rhythm and normal heart sounds.  No murmur heard. Pulmonary/Chest: Effort normal and breath sounds normal. No respiratory distress. He has no wheezes.  Musculoskeletal: He exhibits no edema.  Lymphadenopathy:    He has no cervical adenopathy.  Neurological: He is alert.  Skin: Skin is warm. No rash noted.  Psychiatric: His behavior is normal.  Vitals reviewed.         Assessment & Plan:  Cholesterol profile looks much better continue current measures cut back on alcohol use  Elevated ferritin need further workup to rule out hemochromatosis additional labs ordered importance of cutting back on alcohol importance of sticking with his medicines  Comprehensive follow-up within 6 months time recommend the patient do a good job of diet exercise losing weight in addition to this use nicotine patches to help him quit smoking he tried Chantix but had side effects with the Wellbutrin did not help  Patient denies being depressed  The patient is going to use nicotine patches to help quit smoking I discussed with him how to do so

## 2017-06-03 ENCOUNTER — Telehealth: Payer: Self-pay | Admitting: Family Medicine

## 2017-06-03 NOTE — Telephone Encounter (Signed)
Pt needs an order for CPAP supplies along with demographics & copy of sleep study sent to [facility]  Fx# [id number]

## 2017-06-05 ENCOUNTER — Telehealth: Payer: Self-pay | Admitting: Family Medicine

## 2017-06-05 ENCOUNTER — Other Ambulatory Visit: Payer: Self-pay

## 2017-06-05 MED ORDER — NICOTINE 14 MG/24HR TD PT24: 14.0000 mg | MEDICATED_PATCH | Freq: Every day | TRANSDERMAL | 0 refills | Status: DC

## 2017-06-05 MED ORDER — NICOTINE 21 MG/24HR TD PT24: 21.0000 mg | MEDICATED_PATCH | Freq: Every day | TRANSDERMAL | 0 refills | Status: DC

## 2017-06-05 MED ORDER — NICOTINE 7 MG/24HR TD PT24: 7.0000 mg | MEDICATED_PATCH | Freq: Every day | TRANSDERMAL | 0 refills | Status: DC

## 2017-06-05 NOTE — Telephone Encounter (Signed)
Order & documentation faxed [facility] to notify pt - info was sent

## 2017-06-05 NOTE — Telephone Encounter (Signed)
[facility] of the printer. The rest went through electronically. I left a message to r/c.

## 2017-06-05 NOTE — Telephone Encounter (Signed)
Patient is requesting a prescription for Nicoderm patch with refills on which his insurance covers. He would [facility] or if [facility] cant do them [facility]

## 2017-06-05 NOTE — Telephone Encounter (Signed)
NicoDerm patches, 21 mg, use daily for 8 weeks, 14 mg patch, use daily for 8 weeks, 7 mg patch use daily for 8 weeks

## 2017-06-06 NOTE — Telephone Encounter (Signed)
Patient is aware 

## 2017-07-08 LAB — HM DIABETES EYE EXAM

## 2017-07-10 ENCOUNTER — Other Ambulatory Visit: Payer: Self-pay | Admitting: Family Medicine

## 2017-07-10 MED ORDER — NICOTINE 14 MG/24HR TD PT24: 14.0000 mg | MEDICATED_PATCH | Freq: Every day | TRANSDERMAL | 3 refills | Status: DC

## 2017-07-10 NOTE — Telephone Encounter (Signed)
Pt called stating that he is about to finish the 21 mg Nicoderm patches and is needing a prescription for the next level patches. Please advise.    [facility]

## 2017-07-21 ENCOUNTER — Other Ambulatory Visit: Payer: Self-pay | Admitting: Family Medicine

## 2017-08-20 ENCOUNTER — Encounter: Payer: Self-pay | Admitting: *Deleted

## 2017-09-03 DIAGNOSIS — Z0289 Encounter for other administrative examinations: Secondary | ICD-10-CM

## 2017-09-04 ENCOUNTER — Telehealth: Payer: Self-pay | Admitting: Family Medicine

## 2017-09-04 NOTE — Telephone Encounter (Signed)
I reviewed over his FMLA as best I can tell it looks good.  If any problems notify us.  Also please tell the patient that he does have labs that we would like for him to go ahead and get drawn regarding his ferritin somewhere in the next couple weeks so we can see if there has been improvement thank you he should keep his follow-up office visit in August

## 2017-09-04 NOTE — Telephone Encounter (Signed)
Patient dropped off FMLA yesterday to be updated for 6 month intermittent please review ,date,sign. In your yellow folder.

## 2017-09-05 NOTE — Telephone Encounter (Signed)
Patient advised Dr Lorin Picket reviewed over his FMLA as best he can tell it looks good.  If any problems notify us. Also dr Lorin Picket requests patient have labs drawn regarding his ferritin somewhere in the next couple weeks so we can see if there has been improvement thank you he should keep his follow-up office visit in August. Patient verbalized understanding.

## 2017-09-05 NOTE — Telephone Encounter (Signed)
Left message to return call 

## 2017-10-08 ENCOUNTER — Ambulatory Visit (INDEPENDENT_AMBULATORY_CARE_PROVIDER_SITE_OTHER): Payer: Managed Care, Other (non HMO) | Admitting: Family Medicine

## 2017-10-08 ENCOUNTER — Encounter: Payer: Self-pay | Admitting: Family Medicine

## 2017-10-08 VITALS — BP 130/82 | Ht 71.0 in

## 2017-10-08 DIAGNOSIS — F321 Major depressive disorder, single episode, moderate: Secondary | ICD-10-CM

## 2017-10-08 DIAGNOSIS — E7849 Other hyperlipidemia: Secondary | ICD-10-CM

## 2017-10-08 MED ORDER — SERTRALINE HCL 50 MG PO TABS: 50.0000 mg | ORAL_TABLET | Freq: Every day | ORAL | 1 refills | Status: DC

## 2017-10-08 NOTE — Progress Notes (Signed)
   Subjective:    Patient ID: Blake [name], male    DOB: 1980-07-17, 37 y.o.   MRN: [id number]  HPI  Patient arrives to discuss depression.    Office Visit from 10/08/2017 in [facility]  PHQ-9 Total Score  15     20 minutes spent with patient discussing his depression he is under a lot of stress  He smokes he also drinks some alcohol he does not exercise does not watch his diet denies any chest tightness pressure pain finds himself feeling down sad blue denies being suicidal denies any other particular troubles  Review of Systems  Constitutional: Negative for activity change, fatigue and fever.  HENT: Negative for congestion and rhinorrhea.   Respiratory: Negative for cough and shortness of breath.   Cardiovascular: Negative for chest pain and leg swelling.  Gastrointestinal: Negative for abdominal pain, diarrhea and nausea.  Genitourinary: Negative for dysuria and hematuria.  Neurological: Negative for weakness and headaches.  Psychiatric/Behavioral: Negative for behavioral problems.       Objective:   Physical Exam  Constitutional: He appears well-nourished. No distress.  Cardiovascular: Normal rate, regular rhythm and normal heart sounds.  No murmur heard. Pulmonary/Chest: Effort normal and breath sounds normal. No respiratory distress.  Musculoskeletal: He exhibits no edema.  Lymphadenopathy:    He has no cervical adenopathy.  Neurological: He is alert.  Psychiatric: His behavior is normal.  Vitals reviewed.         Assessment & Plan:  Patient will do lab work in the fall time Patient agrees to going ahead with antidepressant He will also give us an update within 2 weeks how he is doing He is not suicidal he agrees to notify us if he starts feeling that way he knows to go to the ER immediately should he feel that way Patient will also get an appointment with a counselor that he has at work he will follow-up here in 4 weeks

## 2017-10-08 NOTE — Patient Instructions (Signed)
Send me update within 2 weeks

## 2017-11-04 ENCOUNTER — Encounter: Payer: Self-pay | Admitting: Family Medicine

## 2017-11-08 ENCOUNTER — Ambulatory Visit: Payer: Managed Care, Other (non HMO) | Admitting: Family Medicine

## 2017-11-17 ENCOUNTER — Other Ambulatory Visit: Payer: Self-pay | Admitting: Family Medicine

## 2017-11-18 ENCOUNTER — Encounter: Payer: Self-pay | Admitting: Family Medicine

## 2017-11-26 DIAGNOSIS — Z029 Encounter for administrative examinations, unspecified: Secondary | ICD-10-CM

## 2017-11-29 ENCOUNTER — Telehealth: Payer: Self-pay | Admitting: Family Medicine

## 2017-11-29 NOTE — Telephone Encounter (Signed)
Form filled out best as I can

## 2017-11-29 NOTE — Telephone Encounter (Signed)
Patient dropped off FMLA to be updated he hasnt missed any days out of work so didn't know what to put in line 1, its highlighted can you please fill in and date,sign form. In your yellow folder.

## 2017-12-11 ENCOUNTER — Other Ambulatory Visit: Payer: Self-pay | Admitting: Family Medicine

## 2017-12-20 ENCOUNTER — Ambulatory Visit: Payer: Managed Care, Other (non HMO) | Admitting: Family Medicine

## 2017-12-21 LAB — IRON AND TIBC
IRON SATURATION: 21 % (ref 15–55)
Iron: 70 ug/dL (ref 38–169)
Total Iron Binding Capacity: 340 ug/dL (ref 250–450)
UIBC: 270 ug/dL (ref 111–343)

## 2017-12-21 LAB — FERRITIN: Ferritin: 466 ng/mL — ABNORMAL HIGH (ref 30–400)

## 2017-12-24 LAB — SPECIMEN STATUS REPORT

## 2017-12-26 ENCOUNTER — Ambulatory Visit (INDEPENDENT_AMBULATORY_CARE_PROVIDER_SITE_OTHER): Payer: Managed Care, Other (non HMO) | Admitting: Family Medicine

## 2017-12-26 ENCOUNTER — Encounter: Payer: Self-pay | Admitting: Family Medicine

## 2017-12-26 VITALS — BP 154/88 | Ht 71.0 in | Wt 261.0 lb

## 2017-12-26 DIAGNOSIS — R5383 Other fatigue: Secondary | ICD-10-CM

## 2017-12-26 DIAGNOSIS — K760 Fatty (change of) liver, not elsewhere classified: Secondary | ICD-10-CM

## 2017-12-26 DIAGNOSIS — E7849 Other hyperlipidemia: Secondary | ICD-10-CM

## 2017-12-26 DIAGNOSIS — E119 Type 2 diabetes mellitus without complications: Secondary | ICD-10-CM

## 2017-12-26 MED ORDER — ATORVASTATIN CALCIUM 40 MG PO TABS: 40.0000 mg | ORAL_TABLET | Freq: Every day | ORAL | 1 refills | Status: DC

## 2017-12-26 NOTE — Progress Notes (Signed)
   Subjective:    Patient ID: Blake [name], male    DOB: [birth year], 37 y.o.   MRN: [id number]  HPI  Patient is here today to follow up on depression.He states he is no longer on Zoloft because of sweating.He states today is his birthday and was told he was hired to work at the city of [place] today. He states a lot of the depression he had was related to his old job and working third shift,so he is really happy. 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  HENT: Negative for congestion and rhinorrhea.   Respiratory: Negative for cough and shortness of breath.   Cardiovascular: Negative for chest pain and leg swelling.  Gastrointestinal: Negative for abdominal pain and diarrhea.  Skin: Negative for color change and rash.  Neurological: Negative for dizziness and headaches.  Psychiatric/Behavioral: Negative for behavioral problems and confusion.       Objective:   Physical Exam  Constitutional: He appears well-nourished. No distress.  HENT:  Head: Normocephalic and atraumatic.  Eyes: Right eye exhibits no discharge. Left eye exhibits no discharge.  Neck: No tracheal deviation present.  Cardiovascular: Normal rate, regular rhythm and normal heart sounds.  No murmur heard. Pulmonary/Chest: Effort normal and breath sounds normal. No respiratory distress.  Musculoskeletal: He exhibits no edema.  Lymphadenopathy:    He has no cervical adenopathy.  Neurological: He is alert. Coordination normal.  Skin: Skin is warm and dry.  Psychiatric: He has a normal mood and affect. His  behavior is normal.  Vitals reviewed.   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

## 2017-12-26 NOTE — Patient Instructions (Signed)
Do your labs in 8 to 10 weeks

## 2017-12-27 LAB — SPECIMEN STATUS REPORT

## 2017-12-27 LAB — HEPATIC FUNCTION PANEL
ALK PHOS: 87 IU/L (ref 39–117)
ALT: 62 IU/L — AB (ref 0–44)
AST: 34 IU/L (ref 0–40)
Albumin: 4.5 g/dL (ref 3.5–5.5)
Bilirubin Total: <0.2 mg/dL (ref 0.0–1.2)
Bilirubin, Direct: 0.09 mg/dL (ref 0.00–0.40)
TOTAL PROTEIN: 6.6 g/dL (ref 6.0–8.5)

## 2018-01-15 DIAGNOSIS — G4733 Obstructive sleep apnea (adult) (pediatric): Secondary | ICD-10-CM | POA: Diagnosis not present

## 2018-01-18 ENCOUNTER — Other Ambulatory Visit: Payer: Self-pay | Admitting: Family Medicine

## 2018-01-22 DIAGNOSIS — Z23 Encounter for immunization: Secondary | ICD-10-CM | POA: Diagnosis not present

## 2018-02-25 ENCOUNTER — Telehealth: Payer: Self-pay | Admitting: Family Medicine

## 2018-02-25 NOTE — Telephone Encounter (Signed)
Last labs on 12/26/17 for lipid, hepatic, bmet, hgb a1c, ferritin. Please advise. Thank you.

## 2018-02-25 NOTE — Telephone Encounter (Signed)
Patient has physical schedule for 12/5 and needed labs done

## 2018-02-25 NOTE — Telephone Encounter (Signed)
Patient is aware 

## 2018-02-25 NOTE — Telephone Encounter (Signed)
Lab work was ordered in September this is still good he can get it done-preferably fasting

## 2018-03-10 ENCOUNTER — Telehealth: Payer: Self-pay | Admitting: Family Medicine

## 2018-03-10 MED ORDER — NICOTINE 21 MG/24HR TD PT24: 21.0000 mg | MEDICATED_PATCH | Freq: Every day | TRANSDERMAL | 0 refills | Status: DC

## 2018-03-10 NOTE — Telephone Encounter (Signed)
Patient returned our call. No nurse was available at the time so I advised the patient that his prescription was called in and I also read the note from Dr. Lorin PicketScott regarding the different milligrams and that he will start off on a higher strength and then will get lower dose.  Patient understood and will check with the pharmacy and call us back with any questions.

## 2018-03-10 NOTE — Telephone Encounter (Signed)
Patient is aware the 21 mg was sent in and to call us if the medication has worked and we will send in the 14 mg.

## 2018-03-10 NOTE — Telephone Encounter (Signed)
I called and left a message to r/c. 

## 2018-03-10 NOTE — Telephone Encounter (Signed)
The 14 days supply has been sent in to the requested pharmacy.

## 2018-03-10 NOTE — Telephone Encounter (Signed)
Patient is aware the medication was sent in for him and he will call us with whether or not the 21 mg is working and ask us to send in the 14 mg.

## 2018-03-10 NOTE — Telephone Encounter (Signed)
NicoDerm patch 21 mg, use as directed daily, send in 2 weeks supply with refill Typically a person will use 21 mg for approximately 2 to 4 weeks then switch over to the 14 mg for an additional 4 weeks then 7 mg patch for an additional 4 to 6 weeks If the 21 mg patch is working out he can notify us and we can send in the prescription for the 14 mg and 7 mg

## 2018-03-10 NOTE — Telephone Encounter (Signed)
Pt is requesting nicotine (NICODERM CQ) 21 mg/24hr patch (SQUARE) be sent to [id number] - [place], [place] - [address] The big circle ones do not stay on his arm well. He has shaved his shoulder and they still are not sticking. He would like to switch back to the square ones.

## 2018-03-13 ENCOUNTER — Encounter: Payer: Managed Care, Other (non HMO) | Admitting: Family Medicine

## 2018-03-31 LAB — BASIC METABOLIC PANEL
BUN / CREAT RATIO: 11 (ref 9–20)
BUN: 10 mg/dL (ref 6–20)
CHLORIDE: 101 mmol/L (ref 96–106)
CO2: 20 mmol/L (ref 20–29)
Calcium: 9.5 mg/dL (ref 8.7–10.2)
Creatinine, Ser: 0.94 mg/dL (ref 0.76–1.27)
GFR calc non Af Amer: 105 mL/min/{1.73_m2} (ref >59)
GFR, EST AFRICAN AMERICAN: 121 mL/min/{1.73_m2} (ref >59)
Glucose: 156 mg/dL — ABNORMAL HIGH (ref 65–99)
POTASSIUM: 4.5 mmol/L (ref 3.5–5.2)
Sodium: 139 mmol/L (ref 134–144)

## 2018-03-31 LAB — HEPATIC FUNCTION PANEL
ALT: 61 IU/L — AB (ref 0–44)
AST: 29 IU/L (ref 0–40)
Albumin: 4.6 g/dL (ref 3.5–5.5)
Alkaline Phosphatase: 62 IU/L (ref 39–117)
Bilirubin Total: 0.4 mg/dL (ref 0.0–1.2)
Bilirubin, Direct: 0.17 mg/dL (ref 0.00–0.40)
Total Protein: 6.8 g/dL (ref 6.0–8.5)

## 2018-03-31 LAB — LIPID PANEL
CHOLESTEROL TOTAL: 217 mg/dL — AB (ref 100–199)
Chol/HDL Ratio: 9.4 ratio — ABNORMAL HIGH (ref 0.0–5.0)
HDL: 23 mg/dL — AB (ref >39)
TRIGLYCERIDES: 1160 mg/dL — AB (ref 0–149)

## 2018-03-31 LAB — HEMOGLOBIN A1C
ESTIMATED AVERAGE GLUCOSE: 163 mg/dL
Hgb A1c MFr Bld: 7.3 % — ABNORMAL HIGH (ref 4.8–5.6)

## 2018-03-31 LAB — FERRITIN: Ferritin: 574 ng/mL — ABNORMAL HIGH (ref 30–400)

## 2018-04-03 ENCOUNTER — Encounter: Payer: Self-pay | Admitting: Family Medicine

## 2018-04-03 ENCOUNTER — Ambulatory Visit: Payer: Managed Care, Other (non HMO) | Admitting: Family Medicine

## 2018-04-03 VITALS — BP 128/76 | Ht 71.0 in | Wt 258.2 lb

## 2018-04-03 DIAGNOSIS — E781 Pure hyperglyceridemia: Secondary | ICD-10-CM

## 2018-04-03 DIAGNOSIS — Z23 Encounter for immunization: Secondary | ICD-10-CM | POA: Diagnosis not present

## 2018-04-03 DIAGNOSIS — K760 Fatty (change of) liver, not elsewhere classified: Secondary | ICD-10-CM

## 2018-04-03 DIAGNOSIS — Z0000 Encounter for general adult medical examination without abnormal findings: Secondary | ICD-10-CM

## 2018-04-03 DIAGNOSIS — E119 Type 2 diabetes mellitus without complications: Secondary | ICD-10-CM

## 2018-04-03 MED ORDER — FENOFIBRATE 160 MG PO TABS: 160.0000 mg | ORAL_TABLET | Freq: Every day | ORAL | 1 refills | Status: DC

## 2018-04-03 MED ORDER — ROSUVASTATIN CALCIUM 40 MG PO TABS: 40.0000 mg | ORAL_TABLET | Freq: Every day | ORAL | 1 refills | Status: DC

## 2018-04-03 MED ORDER — METFORMIN HCL 500 MG PO TABS: ORAL_TABLET | ORAL | 1 refills | Status: DC

## 2018-04-03 NOTE — Progress Notes (Signed)
Subjective:    Patient ID: Blake KinsmanSteven R [name], male    DOB: [birth year], 37 y.o.   MRN: [id number]  HPI The patient comes in today for a wellness visit.    A review of their health history was completed.  A review of medications was also completed.  Any needed refills; None  Eating habits: health conscious  Falls/  MVA accidents in past few months: none  Regular exercise: walks on lunch and on both breaks. [facility] and [employer].   Specialist pt sees on regular basis: none  Preventative health issues were discussed.   Additional concerns: Pt did have blood work done and would like to review those. Pt has had flu shot this year.   Hyperlipidemia triglycerides very elevated patient has been consuming more alcohol than he should his ferritin is also elevated and liver enzyme importance was discussed with the patient of cutting back or cutting out of alcohol in order to improve things he states over the past couple months his stress levels have been a lot less and he has been doing a much better job of cutting back he will work harder on all of this  Patient also has diabetes issues A1c higher than what we like to see it tolerating the medicine well he understands diet for the most part but would like to see this looked into further with dietary consult  Moderate obesity patient is going to watch diet exercise and try to bring his weight down  Depression is in remission under good control currently.  Not on any medicines for this  Smokes occasionally but knows he needs to quit completely he is using the NicoDerm patches  Review of Systems  Constitutional: Negative for diaphoresis and fatigue.  HENT: Negative for congestion and rhinorrhea.   Respiratory: Negative for cough and shortness of breath.   Cardiovascular: Negative for chest pain and leg swelling.  Gastrointestinal: Negative for abdominal pain and diarrhea.  Skin: Negative for color change and rash.  Neurological:  Negative for dizziness and headaches.  Psychiatric/Behavioral: Negative for behavioral problems and confusion.       Objective:   Physical Exam Vitals signs reviewed.  Constitutional:      General: He is not in acute distress. HENT:     Head: Normocephalic and atraumatic.  Eyes:     General:        Right eye: No discharge.        Left eye: No discharge.  Neck:     Trachea: No tracheal deviation.  Cardiovascular:     Rate and Rhythm: Normal rate and regular rhythm.     Heart sounds: Normal heart sounds. No murmur.  Pulmonary:     Effort: Pulmonary effort is normal. No respiratory distress.     Breath sounds: Normal breath sounds.  Lymphadenopathy:     Cervical: No cervical adenopathy.  Skin:    General: Skin is warm and dry.  Neurological:     Mental Status: He is alert.     Coordination: Coordination normal.  Psychiatric:        Behavior: Behavior normal.           Assessment & Plan:  Adult wellness-complete.wellness physical was conducted today. Importance of diet and exercise were discussed in detail.  In addition to this a discussion regarding safety was also covered. We also reviewed over immunizations and gave recommendations regarding current immunization needed for age.  In addition to this additional areas were also touched  on including: Preventative health exams needed:  Colonoscopy not indicated  Patient was advised yearly wellness exam  Diabetes subpar control increase metformin 2 tablets twice daily if he has trouble with the medication he will let us know  Excessive elevation of triglycerides watch diet limiting alcohol see diabetic nutritionist Recheck this lab work again in 3 months time Change cholesterol medicine to Crestor Follow-up if ongoing troubles otherwise see him back in 6 months  Elevated ferritin if this is still elevated in 3 months we will do genetic testing for hemochromatosis as well as getting him in with specialist.

## 2018-04-07 ENCOUNTER — Encounter: Payer: Self-pay | Admitting: Family Medicine

## 2018-05-19 ENCOUNTER — Other Ambulatory Visit: Payer: Self-pay

## 2018-05-19 ENCOUNTER — Telehealth: Payer: Self-pay | Admitting: Family Medicine

## 2018-05-19 MED ORDER — NICOTINE 21 MG/24HR TD PT24: 21.0000 mg | MEDICATED_PATCH | Freq: Every day | TRANSDERMAL | 2 refills | Status: DC

## 2018-05-19 NOTE — Telephone Encounter (Signed)
Pt is requesting refill on nicotine (NICODERM CQ) 21 mg/24hr patch. Please send to [id number] - , [place] - [address]

## 2018-05-19 NOTE — Telephone Encounter (Signed)
Refills sent to pharmacy and pt is aware  

## 2018-05-19 NOTE — Telephone Encounter (Signed)
May have 2 refills 

## 2018-06-26 ENCOUNTER — Ambulatory Visit: Payer: Managed Care, Other (non HMO) | Admitting: Family Medicine

## 2018-09-24 ENCOUNTER — Telehealth: Payer: Self-pay | Admitting: Family Medicine

## 2018-09-24 NOTE — Telephone Encounter (Signed)
Orders put in 04/03/18 so they should be good til June 26th. Left message to return call to see when pt was planning on doing bw.

## 2018-09-24 NOTE — Telephone Encounter (Signed)
Pt is needing new lab orders. His orders have expired. He has follow up July 20th

## 2018-09-25 NOTE — Telephone Encounter (Signed)
Left message to return call 

## 2018-09-25 NOTE — Telephone Encounter (Signed)
Advised pt that orders are good for 6 months and still good til June 26th and he states he will do them tomorrow at [place]

## 2018-09-29 ENCOUNTER — Ambulatory Visit: Payer: PRIVATE HEALTH INSURANCE | Admitting: Family Medicine

## 2018-10-21 ENCOUNTER — Other Ambulatory Visit: Payer: Self-pay | Admitting: Family Medicine

## 2018-10-24 ENCOUNTER — Telehealth: Payer: Self-pay | Admitting: Family Medicine

## 2018-10-24 DIAGNOSIS — E119 Type 2 diabetes mellitus without complications: Secondary | ICD-10-CM

## 2018-10-24 DIAGNOSIS — Z79899 Other long term (current) drug therapy: Secondary | ICD-10-CM

## 2018-10-24 DIAGNOSIS — E7849 Other hyperlipidemia: Secondary | ICD-10-CM

## 2018-10-24 DIAGNOSIS — Z0000 Encounter for general adult medical examination without abnormal findings: Secondary | ICD-10-CM

## 2018-10-24 DIAGNOSIS — E781 Pure hyperglyceridemia: Secondary | ICD-10-CM

## 2018-10-24 DIAGNOSIS — R7989 Other specified abnormal findings of blood chemistry: Secondary | ICD-10-CM

## 2018-10-24 NOTE — Telephone Encounter (Signed)
Patient labs from 04/03/18 has expired and needing new ones for appointment in August

## 2018-10-24 NOTE — Telephone Encounter (Signed)
Last labs ordered but not completed 04/03/18 urine microalbumin, bmet, ferritn, lipid,liver, A1c. Please advise. Thank you

## 2018-10-25 NOTE — Telephone Encounter (Signed)
Nurses go ahead and please reorder all of the tests that were ordered in December so he can move forward with this thank you

## 2018-10-27 ENCOUNTER — Ambulatory Visit: Payer: PRIVATE HEALTH INSURANCE | Admitting: Family Medicine

## 2018-10-27 NOTE — Telephone Encounter (Signed)
Labs orders placed and pt notified. Pt verbalized understanding.

## 2018-11-13 ENCOUNTER — Ambulatory Visit: Payer: PRIVATE HEALTH INSURANCE | Admitting: Family Medicine

## 2018-11-13 ENCOUNTER — Other Ambulatory Visit: Payer: Self-pay

## 2018-11-14 ENCOUNTER — Other Ambulatory Visit: Payer: Self-pay | Admitting: Family Medicine

## 2018-11-16 NOTE — Telephone Encounter (Signed)
May have 90-day prescription ?

## 2018-11-18 LAB — HEMOGLOBIN A1C
Est. average glucose Bld gHb Est-mCnc: 137 mg/dL
Hgb A1c MFr Bld: 6.4 % — ABNORMAL HIGH (ref 4.8–5.6)

## 2018-11-18 LAB — LIPID PANEL
Chol/HDL Ratio: 6.6 ratio — ABNORMAL HIGH (ref 0.0–5.0)
Cholesterol, Total: 210 mg/dL — ABNORMAL HIGH (ref 100–199)
HDL: 32 mg/dL — ABNORMAL LOW (ref >39)
Triglycerides: 631 mg/dL (ref 0–149)

## 2018-11-18 LAB — MICROALBUMIN / CREATININE URINE RATIO
Creatinine, Urine: 119.1 mg/dL
Microalb/Creat Ratio: 6 mg/g creat (ref 0–29)
Microalbumin, Urine: 6.7 ug/mL

## 2018-11-18 LAB — BASIC METABOLIC PANEL WITH GFR
BUN/Creatinine Ratio: 8 — ABNORMAL LOW (ref 9–20)
BUN: 7 mg/dL (ref 6–20)
CO2: 24 mmol/L (ref 20–29)
Calcium: 9.6 mg/dL (ref 8.7–10.2)
Chloride: 100 mmol/L (ref 96–106)
Creatinine, Ser: 0.86 mg/dL (ref 0.76–1.27)
GFR calc Af Amer: 130 mL/min/{1.73_m2}
GFR calc non Af Amer: 112 mL/min/{1.73_m2}
Glucose: 117 mg/dL — ABNORMAL HIGH (ref 65–99)
Potassium: 4.3 mmol/L (ref 3.5–5.2)
Sodium: 139 mmol/L (ref 134–144)

## 2018-11-18 LAB — HEPATIC FUNCTION PANEL
ALT: 53 IU/L — ABNORMAL HIGH (ref 0–44)
AST: 28 IU/L (ref 0–40)
Albumin: 4.5 g/dL (ref 4.0–5.0)
Alkaline Phosphatase: 55 IU/L (ref 39–117)
Bilirubin Total: 0.3 mg/dL (ref 0.0–1.2)
Bilirubin, Direct: 0.11 mg/dL (ref 0.00–0.40)
Total Protein: 6.8 g/dL (ref 6.0–8.5)

## 2018-11-18 LAB — FERRITIN: Ferritin: 326 ng/mL (ref 30–400)

## 2018-11-19 ENCOUNTER — Other Ambulatory Visit: Payer: Self-pay

## 2018-11-19 ENCOUNTER — Ambulatory Visit (INDEPENDENT_AMBULATORY_CARE_PROVIDER_SITE_OTHER): Payer: PRIVATE HEALTH INSURANCE | Admitting: Family Medicine

## 2018-11-19 ENCOUNTER — Encounter: Payer: Self-pay | Admitting: Family Medicine

## 2018-11-19 VITALS — Ht 71.0 in | Wt 258.0 lb

## 2018-11-19 DIAGNOSIS — E781 Pure hyperglyceridemia: Secondary | ICD-10-CM

## 2018-11-19 DIAGNOSIS — E119 Type 2 diabetes mellitus without complications: Secondary | ICD-10-CM | POA: Insufficient documentation

## 2018-11-19 DIAGNOSIS — K760 Fatty (change of) liver, not elsewhere classified: Secondary | ICD-10-CM | POA: Diagnosis not present

## 2018-11-19 DIAGNOSIS — Z720 Tobacco use: Secondary | ICD-10-CM | POA: Diagnosis not present

## 2018-11-19 HISTORY — DX: Type 2 diabetes mellitus without complications: E11.9

## 2018-11-19 HISTORY — DX: Tobacco use: Z72.0

## 2018-11-19 MED ORDER — FENOFIBRATE 160 MG PO TABS: 160.0000 mg | ORAL_TABLET | Freq: Every day | ORAL | 1 refills | Status: DC

## 2018-11-19 MED ORDER — ROSUVASTATIN CALCIUM 40 MG PO TABS: ORAL_TABLET | ORAL | 1 refills | Status: DC

## 2018-11-19 MED ORDER — METFORMIN HCL 500 MG PO TABS: ORAL_TABLET | ORAL | 1 refills | Status: DC

## 2018-11-19 NOTE — Progress Notes (Signed)
Subjective:    Patient ID: Blake [name] [place], male    DOB: 1981-01-08, 38 y.o.   MRN: [id number] Very nice patient States stress levels are doing better Working hard to stay away from smoking Cutting back on alcohol Try to eat healthy States his sugars overall been doing well denies any high sugars Tries to minimize starch intake does take his medicines on a regular basis  The patient was seen today as part of a comprehensive diabetic check up.the patient does have diabetes.  The patient follows here on a regular basis.  The patient relates medication compliance. No significant side effects to the medications. Denies any low glucose spells. Relates compliance with diet to a reasonable level. Patient does do labwork intermittently and understands the dangers of diabetes.   Hyperlipidemia This is a chronic problem. The current episode started more than 1 year ago. Pertinent negatives include no chest pain or shortness of breath. Treatments tried: crestor and fenofibrate. There are no compliance problems.  Risk factors for coronary artery disease include dyslipidemia.  I did review the labs with him  Results for orders placed or performed in visit on 10/24/18  Urine Microalbumin w/creat. ratio  Result Value Ref Range   Creatinine, Urine 119.1 Not Estab. mg/dL   Microalbumin, Urine 6.7 Not Estab. ug/mL   Microalb/Creat Ratio 6 0 - 29 mg/g creat  Basic Metabolic Panel (BMET)  Result Value Ref Range   Glucose 117 (H) 65 - 99 mg/dL   BUN 7 6 - 20 mg/dL   Creatinine, Ser 7.820.86 0.76 - 1.27 mg/dL   GFR calc non Af Amer 112 >59 mL/min/1.73   GFR calc Af Amer 130 >59 mL/min/1.73   BUN/Creatinine Ratio 8 (L) 9 - 20   Sodium 139 134 - 144 mmol/L   Potassium 4.3 3.5 - 5.2 mmol/L   Chloride 100 96 - 106 mmol/L   CO2 24 20 - 29 mmol/L   Calcium 9.6 8.7 - 10.2 mg/dL  Ferritin  Result Value Ref Range   Ferritin 326 30 - 400 ng/mL  Lipid Profile  Result Value Ref Range   Cholesterol, Total 210  (H) 100 - 199 mg/dL   Triglycerides 956631 (HH) 0 - 149 mg/dL   HDL 32 (L) >21>39 mg/dL   VLDL Cholesterol Cal Comment 5 - 40 mg/dL   LDL Calculated Comment 0 - 99 mg/dL   Chol/HDL Ratio 6.6 (H) 0.0 - 5.0 ratio  Hepatic function panel  Result Value Ref Range   Total Protein 6.8 6.0 - 8.5 g/dL   Albumin 4.5 4.0 - 5.0 g/dL   Bilirubin Total 0.3 0.0 - 1.2 mg/dL   Bilirubin, Direct 3.080.11 0.00 - 0.40 mg/dL   Alkaline Phosphatase 55 39 - 117 IU/L   AST 28 0 - 40 IU/L   ALT 53 (H) 0 - 44 IU/L  Hemoglobin A1c  Result Value Ref Range   Hgb A1c MFr Bld 6.4 (H) 4.8 - 5.6 %   Est. average glucose Bld gHb Est-mCnc 137 mg/dL    Virtual Visit via Video Note  I connected with Blake [name] Tobon on 11/19/18 at 10:00 AM EDT by a video enabled telemedicine application and verified that I am speaking with the correct person using two identifiers.  Location: Patient: home Provider: office   I discussed the limitations of evaluation and management by telemedicine and the availability of in person appointments. The patient expressed understanding and agreed to proceed.  History of Present Illness:    Observations/Objective:  Assessment and Plan:   Follow Up Instructions:    I discussed the assessment and treatment plan with the patient. The patient was provided an opportunity to ask questions and all were answered. The patient agreed with the plan and demonstrated an understanding of the instructions.   The patient was advised to call back or seek an in-person evaluation if the symptoms worsen or if the condition fails to improve as anticipated.  I provided 15 minutes of non-face-to-face time during this encounter.      Review of Systems  Constitutional: Negative for diaphoresis and fatigue.  HENT: Negative for congestion and rhinorrhea.   Respiratory: Negative for cough and shortness of breath.   Cardiovascular: Negative for chest pain and leg swelling.  Gastrointestinal: Negative for  abdominal pain and diarrhea.  Skin: Negative for color change and rash.  Neurological: Negative for dizziness and headaches.  Psychiatric/Behavioral: Negative for behavioral problems and confusion.       Objective:   Physical Exam  Patient had virtual visit Appears to be in no distress Atraumatic Neuro able to relate and oriented No apparent resp distress Color normal      Assessment & Plan:  Diabetes under good control A1c looks better watch diet stay active try to lose weight The patient was seen today as part of a comprehensive visit for diabetes. The importance of keeping her A1c at or below 7 was discussed.  Importance of regular physical activity was discussed.   The importance of adherence to medication as well as a controlled low starch/sugar diet was also discussed.  Standard follow-up visit recommended.  Also patient aware failure to keep diabetes under control increases the risk of complications.  Triglycerides better than what they were but very important to continue medication try to avoid alcohol continue to eat healthy  Mild obesity work hard at watching diet watching portion staying active losing weight he has lost 10 pounds  Wellness exam later this year  Comprehensive follow-up 6 months  15 minutes was spent with patient today discussing healthcare issues which they came.  More than 50% of this visit-total duration of visit-was spent in counseling and coordination of care.  Please see diagnosis regarding the focus of this coordination and care

## 2019-01-02 ENCOUNTER — Encounter: Payer: Self-pay | Admitting: Family Medicine

## 2019-01-03 NOTE — Telephone Encounter (Signed)
Nurses Please write out a prescription for nicotine gum Diagnosis tobacco abuse 1 month supply with 6 refills  Patient needs a written prescription in order to get paid for by his insurance

## 2019-01-05 ENCOUNTER — Other Ambulatory Visit: Payer: Self-pay | Admitting: *Deleted

## 2019-01-05 MED ORDER — NICOTINE POLACRILEX 4 MG MT GUM: 4.0000 mg | CHEWING_GUM | OROMUCOSAL | 3 refills | Status: DC | PRN

## 2019-01-05 NOTE — Telephone Encounter (Signed)
Script printed. Await signature from dr scott 

## 2019-01-05 NOTE — Telephone Encounter (Signed)
Script faxed to pharmacy

## 2019-01-08 ENCOUNTER — Other Ambulatory Visit: Payer: Self-pay | Admitting: *Deleted

## 2019-01-08 DIAGNOSIS — Z20822 Contact with and (suspected) exposure to covid-19: Secondary | ICD-10-CM

## 2019-01-09 ENCOUNTER — Encounter: Payer: Self-pay | Admitting: Family Medicine

## 2019-01-09 LAB — NOVEL CORONAVIRUS, NAA: SARS-CoV-2, NAA: NOT DETECTED

## 2019-01-12 ENCOUNTER — Other Ambulatory Visit: Payer: Self-pay

## 2019-01-12 ENCOUNTER — Ambulatory Visit (INDEPENDENT_AMBULATORY_CARE_PROVIDER_SITE_OTHER): Payer: PRIVATE HEALTH INSURANCE | Admitting: Family Medicine

## 2019-01-12 DIAGNOSIS — R05 Cough: Secondary | ICD-10-CM

## 2019-01-12 DIAGNOSIS — Z20828 Contact with and (suspected) exposure to other viral communicable diseases: Secondary | ICD-10-CM

## 2019-01-12 DIAGNOSIS — R519 Headache, unspecified: Secondary | ICD-10-CM

## 2019-01-12 DIAGNOSIS — Z20822 Contact with and (suspected) exposure to covid-19: Secondary | ICD-10-CM

## 2019-01-12 DIAGNOSIS — R509 Fever, unspecified: Secondary | ICD-10-CM | POA: Diagnosis not present

## 2019-01-12 NOTE — Patient Instructions (Signed)
  This patient was spoken to via phone regarding respiratory illness symptoms.  Please see documentation regarding the symptoms.    The patient was counseled regarding the following.  Possibility exists that patient may have Covid19.  This is a virus that causes severe flulike symptoms.  The majority of individuals have mild illness and are able to recover at home.  There is no antibiotic or treatment for this.  No local testing exists for this.  Patient was educated regarding the warning signs.  Warning signs include trouble breathing, passing out or near syncope, persistent pain or pressure in the chest, new confusion or difficulty to arouse, bluish lips, unable to keep liquids down.  If emergency symptoms are occurring then-if patient feels they are having medical emergency they are to call 911.  Otherwise they need to go to the emergency department.  For milder cases home care is the best approach.  Home care minimizes exposure of the infected patient to others.  It is wise to self isolate. 10 ways to manage respiratory symptoms at home-per CDC guidelines  #1 stay at home-stay home from work and away from other public places.  If having to go out it is important to avoid public transportation ride sharing etc. #2 monitor your symptoms carefully-if symptoms worsen or show signs of emergent issues ER care may be necessary.  If more routine issues may call office for advice. #3 stay rested and stay hydrated. #4 if medical emergencies call 911 and notify dispatch personnel that you may have [phone] #5 cover your cough and sneezes #6 wash your hands often with soap and water for at least 20 seconds or use alcohol based hand sanitizer that contains at least 60% alcohol #7 as much as possible stay in a specific room at your home and stay away from other people in your home.  If possible please use separate bathroom.  If you have to be around other people in or outside of the home wear a facemask. #8  avoid sharing personal items-such as dishes, towels, bedding, do not drink after each other #9 clean all surfaces that are touched often like counters tabletops doorknobs use household cleaning sprays or wipes according to the label instructions #10 this illness can vary from person to person but most people over several days will gradually improve  Home self isolation guidelines CDC recommendations  Patients with symptoms consistent with Covid 19 should stay in self-isolation until: At least 3 days have passed since recovery-this is defined as no fevers (without medications), improvement in respiratory symptoms, and at least 7 days have passed since the symptoms first appeared.  Additional information available at http://www.wolf.info/ and  also www.C19check.com is a good website that educates when a person should consider going to the ER versus home care.  The patient would just have to put in various information and it will automatically give advice.  Should the patient need further advice from [place] to call.

## 2019-01-12 NOTE — Progress Notes (Addendum)
   Subjective:    Patient ID: Blake [name], male    DOB: 09/14/1980, 38 y.o.   MRN: [id number]  HPI  Patient calls to discuss Covid results. Patient's wife tested positive for Covid as well as oldest son and patient is also having symptoms but his test results came back negative. Dr Richardson Landry advised that patient most likely has Covid and did work note for patient till 01/18/2019.  This patient started showing some signs this past Wednesday into Thursday with a little bit of headache body aches not feeling good then he started having some low-grade fevers sweats head congestion drainage coughing no wheezing no shortness of breath  His wife and son's test was positive but his was negative  Patient is on home quarantine  Virtual Visit via Video Note  I connected with Blake [name] on 01/12/19 at  9:30 AM EDT by a video enabled telemedicine application and verified that I am speaking with the correct person using two identifiers.  Location: Patient: home Provider: office   I discussed the limitations of evaluation and management by telemedicine and the availability of in person appointments. The patient expressed understanding and agreed to proceed.  History of Present Illness:    Observations/Objective:   Assessment and Plan:   Follow Up Instructions:    I discussed the assessment and treatment plan with the patient. The patient was provided an opportunity to ask questions and all were answered. The patient agreed with the plan and demonstrated an understanding of the instructions.   The patient was advised to call back or seek an in-person evaluation if the symptoms worsen or if the condition fails to improve as anticipated.  I provided 15 minutes of non-face-to-face time during this encounter.  According to the patient his home O2 saturation is between 93 and 97% blood pressure is mildly elevated 150/100 he is not in any respiratory distress   Review of Systems   Constitutional: Negative for activity change, chills and fever.  HENT: Positive for congestion and rhinorrhea. Negative for ear pain.   Eyes: Negative for discharge.  Respiratory: Positive for cough. Negative for shortness of breath and wheezing.   Cardiovascular: Negative for chest pain.  Gastrointestinal: Positive for diarrhea and nausea. Negative for vomiting.  Musculoskeletal: Negative for arthralgias.       Objective:   Physical Exam Patient had virtual visit Appears to be in no distress Atraumatic Neuro able to relate and oriented No apparent resp distress Color normal      Assessment & Plan:  He will monitor his blood pressure periodically throughout the week and send [place] updates  Suspected COVID infection despite negative test home quarantine for at least 10 days through October 11  Warning signs regarding respiratory illness was discussed with patient in detail go to ER if respiratory distress or shortness of breath  Addendum-I did touch base with the patient on Wednesday he stated he is feeling better denies any chest congestion or shortness of breath warning signs were reviewed again

## 2019-01-16 ENCOUNTER — Encounter: Payer: Self-pay | Admitting: Family Medicine

## 2019-01-16 MED ORDER — LISINOPRIL 5 MG PO TABS: 5.0000 mg | ORAL_TABLET | Freq: Every day | ORAL | 2 refills | Status: DC

## 2019-01-16 NOTE — Telephone Encounter (Signed)
Nurses  Blood pressure readings at the patient sent to [place] are too high.  Ideally we want to see the blood pressure readings below 140/90  Because of his readings I am with his underlying health issues I recommend lisinopril 5 mg 1 daily #30, 2 refills Patient should send [place] some readings within the next 2 weeks I recommend the patient to do a follow-up in 1 month  Also please write out a prescription for the patient for Nicorette gum to aid him with quitting smoking

## 2019-01-16 NOTE — Addendum Note (Signed)
Addended by: Vicente Males on: 01/16/2019 04:43 PM   Modules accepted: Orders

## 2019-02-02 ENCOUNTER — Encounter: Payer: Self-pay | Admitting: Family Medicine

## 2019-02-02 MED ORDER — LISINOPRIL 10 MG PO TABS: 10.0000 mg | ORAL_TABLET | Freq: Every day | ORAL | 2 refills | Status: DC

## 2019-02-02 MED ORDER — GLUCOSE BLOOD VI STRP: ORAL_STRIP | 12 refills | Status: DC

## 2019-02-02 NOTE — Telephone Encounter (Signed)
I recommend increasing the dose of the medicine  Lisinopril new dose 10 mg daily #30 with 3 refills Stop 5 mg dose of lisinopril  Healthy diet recommended Avoiding smoking recommended  Goal regarding blood pressure is 120-130s over 70s-80s Check blood pressure periodically Only when at rest for at least 5 minutes with arm on table cuff at heart level never within 45 minutes of smoking  Recommend follow-up office visit by the end of the year  Nurses-please communicate the above with the patient He can send [place] updates regarding blood pressure in a few weeks

## 2019-02-02 NOTE — Addendum Note (Signed)
Addended by: Dairl Ponder on: 02/02/2019 04:35 PM   Modules accepted: Orders

## 2019-02-23 ENCOUNTER — Encounter: Payer: Self-pay | Admitting: Family Medicine

## 2019-02-23 NOTE — Telephone Encounter (Signed)
Nurses Patient may have prescription for strips Type II diabetic not on insulin Typically insurance companies will pay for 1 testing per day 30 with 12 refills

## 2019-02-24 MED ORDER — GLUCOSE BLOOD VI STRP: ORAL_STRIP | 5 refills | Status: AC

## 2019-02-24 NOTE — Addendum Note (Signed)
Addended by: Dairl Ponder on: 02/24/2019 11:02 AM   Modules accepted: Orders

## 2019-03-16 LAB — HM DIABETES EYE EXAM

## 2019-03-24 DIAGNOSIS — Z029 Encounter for administrative examinations, unspecified: Secondary | ICD-10-CM

## 2019-06-08 ENCOUNTER — Telehealth: Payer: Self-pay | Admitting: Family Medicine

## 2019-06-08 MED ORDER — FENOFIBRATE 160 MG PO TABS: 160.0000 mg | ORAL_TABLET | Freq: Every day | ORAL | 0 refills | Status: DC

## 2019-06-08 MED ORDER — METFORMIN HCL 500 MG PO TABS: ORAL_TABLET | ORAL | 0 refills | Status: DC

## 2019-06-08 MED ORDER — LISINOPRIL 10 MG PO TABS: 10.0000 mg | ORAL_TABLET | Freq: Every day | ORAL | 0 refills | Status: DC

## 2019-06-08 NOTE — Telephone Encounter (Signed)
Pt is requesting refill on fenofibrate 160 MG tablet, lisinopril (ZESTRIL) 10 MG tablet, and metFORMIN (GLUCOPHAGE) 500 MG tablet.   Pt has virtual visit scheduled 3/18.

## 2019-06-08 NOTE — Telephone Encounter (Signed)
Prescription sent electronically to pharmacy. Patient notified. 

## 2019-06-25 ENCOUNTER — Encounter: Payer: Self-pay | Admitting: Family Medicine

## 2019-06-25 ENCOUNTER — Ambulatory Visit (INDEPENDENT_AMBULATORY_CARE_PROVIDER_SITE_OTHER): Payer: PRIVATE HEALTH INSURANCE | Admitting: Family Medicine

## 2019-06-25 VITALS — Ht 71.0 in | Wt 255.0 lb

## 2019-06-25 DIAGNOSIS — K760 Fatty (change of) liver, not elsewhere classified: Secondary | ICD-10-CM

## 2019-06-25 DIAGNOSIS — E119 Type 2 diabetes mellitus without complications: Secondary | ICD-10-CM | POA: Diagnosis not present

## 2019-06-25 DIAGNOSIS — R7989 Other specified abnormal findings of blood chemistry: Secondary | ICD-10-CM

## 2019-06-25 DIAGNOSIS — E781 Pure hyperglyceridemia: Secondary | ICD-10-CM | POA: Diagnosis not present

## 2019-06-25 DIAGNOSIS — Z79899 Other long term (current) drug therapy: Secondary | ICD-10-CM

## 2019-06-25 HISTORY — DX: Other specified abnormal findings of blood chemistry: R79.89

## 2019-06-25 MED ORDER — FENOFIBRATE 160 MG PO TABS: 160.0000 mg | ORAL_TABLET | Freq: Every day | ORAL | 1 refills | Status: DC

## 2019-06-25 MED ORDER — METFORMIN HCL 500 MG PO TABS: ORAL_TABLET | ORAL | 1 refills | Status: DC

## 2019-06-25 MED ORDER — ROSUVASTATIN CALCIUM 40 MG PO TABS: ORAL_TABLET | ORAL | 1 refills | Status: DC

## 2019-06-25 MED ORDER — LISINOPRIL 10 MG PO TABS: 10.0000 mg | ORAL_TABLET | Freq: Every day | ORAL | 1 refills | Status: DC

## 2019-06-25 NOTE — Progress Notes (Signed)
Subjective:    Patient ID: Blake [name], male    DOB: 1980/06/12, 39 y.o.   MRN: [id number]  Hypertension This is a chronic problem. Pertinent negatives include no chest pain, headaches or shortness of breath. Compliance problems include diet (takes meds every day, some exercise).    Sugar has been running between 89 -115 fasting.  Fatty liver - Plan: Hepatic function panel  Hypertriglyceridemia - Plan: Lipid panel  Elevated ferritin - Plan: Iron, TIBC and Ferritin Panel, CBC with Differential/Platelet  Type 2 diabetes mellitus without complication, without long-term current use of insulin (HCC) - Plan: Hemoglobin A1c  High risk medication use - Plan: Basic metabolic panel  The patient is under some increased stress because of his wife's disability.  The patient is handling it well and trying to do the best he can and functioning at work as well as at home. He is trying to watch his diet try to stay active he states that his weather gets better he will become more active and try to lose weight and keep his weight check keep liver enzymes and check States his sugars been doing okay taking his medicines as directed.  He is watching fats in his diet and taking his cholesterol medicine. Pt states no concerns or problems.   Virtual Visit via Video Note  I connected with Blake [name] on 06/25/19 at  8:30 AM EDT by a video enabled telemedicine application and verified that I am speaking with the correct person using two identifiers.  Location: Patient: home Provider: office   I discussed the limitations of evaluation and management by telemedicine and the availability of in person appointments. The patient expressed understanding and agreed to proceed.  History of Present Illness:    Observations/Objective:   Assessment and Plan:   Follow Up Instructions:    I discussed the assessment and treatment plan with the patient. The patient was provided an opportunity to ask  questions and all were answered. The patient agreed with the plan and demonstrated an understanding of the instructions.   The patient was advised to call back or seek an in-person evaluation if the symptoms worsen or if the condition fails to improve as anticipated.  I provided 30 minutes of non-face-to-face time during this encounter.  This included chart review ordering labs reviewing medications ordering medications discussion with the patient and documentation     Review of Systems  Constitutional: Negative for diaphoresis and fatigue.  HENT: Negative for congestion and rhinorrhea.   Respiratory: Negative for cough and shortness of breath.   Cardiovascular: Negative for chest pain and leg swelling.  Gastrointestinal: Negative for abdominal pain and diarrhea.  Skin: Negative for color change and rash.  Neurological: Negative for dizziness and headaches.  Psychiatric/Behavioral: Negative for behavioral problems and confusion.       Objective:   Physical Exam  Patient had virtual visit Appears to be in no distress Atraumatic Neuro able to relate and oriented No apparent resp distress Color normal       Assessment & Plan:  1. Fatty liver Best approach for this fatty liver is for the patient to work hard at trying to eat healthy watch portions and try to lose weight.  Will check lab work await results - Hepatic function panel  2. Hypertriglyceridemia Hyperlipidemia continue current medications.  Await the results - Lipid panel  3. Elevated ferritin Patient in the past has had elevated ferritin we will check TIBC to make sure that there is  no sign of iron overload - Iron, TIBC and Ferritin Panel - CBC with Differential/Platelet  4. Type 2 diabetes mellitus without complication, without long-term current use of insulin (HCC) Diabetes reportedly under good control need to check A1c continue medication - Hemoglobin A1c  5. High risk medication use Patient on blood  pressure medicines states blood pressure been under decent control the best he knows but he will make sure that he will check blood pressure and send [place] some readings - Basic metabolic panel  Obesity patient is watching portion control activity try to bring his weight down Is History of tobacco abuse staying away from smoking uses Nicorette gum occasionally  Under some stress with his wife sickness but he is doing a great job overall  Face-to-face evaluation in 6 months

## 2019-07-02 ENCOUNTER — Ambulatory Visit: Payer: PRIVATE HEALTH INSURANCE | Attending: Internal Medicine

## 2019-07-02 DIAGNOSIS — Z23 Encounter for immunization: Secondary | ICD-10-CM

## 2019-07-02 NOTE — Progress Notes (Signed)
   Covid-19 Vaccination Clinic  Name:  AGUSTUS MANE    MRN: [id number] DOB: November 05, 1980  07/02/2019  Mr. Jurgens was observed post Covid-19 immunization for 15 minutes without incident. He was provided with Vaccine Information Sheet and instruction to access the V-Safe system.   Mr. Stickles was instructed to call 911 with any severe reactions post vaccine: Marland Kitchen Difficulty breathing  . Swelling of face and throat  . A fast heartbeat  . A bad rash all over body  . Dizziness and weakness   Immunizations Administered    Name Date Dose VIS Date Route   Moderna COVID-19 Vaccine 07/02/2019 12:52 PM 0.5 mL 03/10/2019 Intramuscular   Manufacturer: Moderna   Lot: [id number]   NDC: 84696-295-28

## 2019-07-09 LAB — IRON,TIBC AND FERRITIN PANEL
Ferritin: 544 ng/mL — ABNORMAL HIGH (ref 30–400)
Iron Saturation: 20 % (ref 15–55)
Iron: 74 ug/dL (ref 38–169)
Total Iron Binding Capacity: 363 ug/dL (ref 250–450)
UIBC: 289 ug/dL (ref 111–343)

## 2019-07-09 LAB — BASIC METABOLIC PANEL
BUN/Creatinine Ratio: 10 (ref 9–20)
BUN: 10 mg/dL (ref 6–20)
CO2: 20 mmol/L (ref 20–29)
Calcium: 9.8 mg/dL (ref 8.7–10.2)
Chloride: 101 mmol/L (ref 96–106)
Creatinine, Ser: 1.03 mg/dL (ref 0.76–1.27)
GFR calc Af Amer: 108 mL/min/{1.73_m2} (ref >59)
GFR calc non Af Amer: 93 mL/min/{1.73_m2} (ref >59)
Glucose: 135 mg/dL — ABNORMAL HIGH (ref 65–99)
Potassium: 4.2 mmol/L (ref 3.5–5.2)
Sodium: 138 mmol/L (ref 134–144)

## 2019-07-09 LAB — CBC WITH DIFFERENTIAL/PLATELET
Basophils Absolute: 0.1 10*3/uL (ref 0.0–0.2)
Basos: 1 %
EOS (ABSOLUTE): 0.4 10*3/uL (ref 0.0–0.4)
Eos: 5 %
Hematocrit: 43.4 % (ref 37.5–51.0)
Hemoglobin: 14.9 g/dL (ref 13.0–17.7)
Immature Grans (Abs): 0.1 10*3/uL (ref 0.0–0.1)
Immature Granulocytes: 1 %
Lymphocytes Absolute: 2 10*3/uL (ref 0.7–3.1)
Lymphs: 25 %
MCH: 32.1 pg (ref 26.6–33.0)
MCHC: 34.3 g/dL (ref 31.5–35.7)
MCV: 94 fL (ref 79–97)
Monocytes Absolute: 0.6 10*3/uL (ref 0.1–0.9)
Monocytes: 7 %
Neutrophils Absolute: 5 10*3/uL (ref 1.4–7.0)
Neutrophils: 61 %
Platelets: 214 10*3/uL (ref 150–450)
RBC: 4.64 x10E6/uL (ref 4.14–5.80)
RDW: 13.3 % (ref 11.6–15.4)
WBC: 8.1 10*3/uL (ref 3.4–10.8)

## 2019-07-09 LAB — HEPATIC FUNCTION PANEL
ALT: 50 IU/L — ABNORMAL HIGH (ref 0–44)
AST: 36 IU/L (ref 0–40)
Albumin: 4.6 g/dL (ref 4.0–5.0)
Alkaline Phosphatase: 40 IU/L (ref 39–117)
Bilirubin Total: 0.4 mg/dL (ref 0.0–1.2)
Bilirubin, Direct: 0.13 mg/dL (ref 0.00–0.40)
Total Protein: 6.9 g/dL (ref 6.0–8.5)

## 2019-07-09 LAB — HEMOGLOBIN A1C
Est. average glucose Bld gHb Est-mCnc: 151 mg/dL
Hgb A1c MFr Bld: 6.9 % — ABNORMAL HIGH (ref 4.8–5.6)

## 2019-07-09 LAB — LIPID PANEL
Chol/HDL Ratio: 4.4 ratio (ref 0.0–5.0)
Cholesterol, Total: 128 mg/dL (ref 100–199)
HDL: 29 mg/dL — ABNORMAL LOW (ref >39)
LDL Chol Calc (NIH): 35 mg/dL (ref 0–99)
Triglycerides: 445 mg/dL — ABNORMAL HIGH (ref 0–149)
VLDL Cholesterol Cal: 64 mg/dL — ABNORMAL HIGH (ref 5–40)

## 2019-07-29 ENCOUNTER — Telehealth: Payer: Self-pay | Admitting: Family Medicine

## 2019-07-29 MED ORDER — METFORMIN HCL 500 MG PO TABS: ORAL_TABLET | ORAL | 1 refills | Status: DC

## 2019-07-29 NOTE — Telephone Encounter (Signed)
Metformin (Pharmacy is saying they never got the rx sent 06/25/19)  [facility] [address]

## 2019-07-29 NOTE — Telephone Encounter (Signed)
Metformin sent in to pharmacy and pt is aware

## 2019-08-05 ENCOUNTER — Ambulatory Visit: Payer: PRIVATE HEALTH INSURANCE | Attending: Internal Medicine

## 2019-08-05 DIAGNOSIS — Z23 Encounter for immunization: Secondary | ICD-10-CM

## 2019-08-05 NOTE — Progress Notes (Signed)
   Covid-19 Vaccination Clinic  Name:  LOYS SHUGARS    MRN: [id number] DOB: 1980/10/31  08/05/2019  Mr. Broz was observed post Covid-19 immunization for 15 minutes without incident. He was provided with Vaccine Information Sheet and instruction to access the V-Safe system.   Mr. Thiam was instructed to call 911 with any severe reactions post vaccine: Marland Kitchen Difficulty breathing  . Swelling of face and throat  . A fast heartbeat  . A bad rash all over body  . Dizziness and weakness   Immunizations Administered    Name Date Dose VIS Date Route   Moderna COVID-19 Vaccine 08/05/2019 12:23 PM 0.5 mL 03/2019 Intramuscular   Manufacturer: Moderna   Lot: [id number]   NDC: 58309-407-68

## 2019-09-11 ENCOUNTER — Telehealth: Payer: Self-pay | Admitting: Family Medicine

## 2019-09-11 NOTE — Telephone Encounter (Signed)
Pt used to take chantix and it worked the patch did not work as well. He was wanting to see if chantix could be called in or a visit needs to be schedule.

## 2019-09-14 ENCOUNTER — Other Ambulatory Visit: Payer: Self-pay

## 2019-09-14 MED ORDER — CHANTIX STARTING MONTH PAK 0.5 MG X 11 & 1 MG X 42 PO TABS: ORAL_TABLET | ORAL | 0 refills | Status: DC

## 2019-09-14 MED ORDER — VARENICLINE TARTRATE 1 MG PO TABS: 1.0000 mg | ORAL_TABLET | Freq: Two times a day (BID) | ORAL | 1 refills | Status: DC

## 2019-09-14 NOTE — Telephone Encounter (Signed)
Medication sent to pharmacy and pt is aware. Pt aware to contact us if any issues

## 2019-09-14 NOTE — Telephone Encounter (Signed)
Please talk with please talk with patient Chantix is a good medication It can trigger depression in the small number of people, also has a very small risk of triggering suicidal ideation If a person had those issues immediately stop the medicine-make sure patient understands this and it is documented If he is familiar with the medicine and would want to use it again we can do so If he is uncertain about the medicine or would like to have discussion I recommend a visit or virtual visit to discuss before prescribing Please see what patient would like to do

## 2019-09-14 NOTE — Telephone Encounter (Signed)
Left message to return call 

## 2019-09-14 NOTE — Telephone Encounter (Signed)
Pt returned call. Pt states he has taken Chantix before and did fine on med. Did inform pt of risk and that if those occur to stop med and contact us. Pt verbalized understanding. Pt would like Chantix to go to [facility] on [address]. Please advise. Thank you

## 2019-09-14 NOTE — Telephone Encounter (Signed)
May send in Chantix-starter pack, and 2 months of maintenance pack, notify us if any problems with medicine

## 2019-10-22 ENCOUNTER — Telehealth: Payer: Self-pay | Admitting: Family Medicine

## 2019-10-22 NOTE — Telephone Encounter (Signed)
Patient states he is currently having symptoms of Covid and his employer is suggesting testing. Patient states he has made an appt at urgent care to be checked and tested.

## 2019-10-22 NOTE — Telephone Encounter (Signed)
Pt is needing to have a covid test done and wants to know how Dr. Lorin Picket recommends him getting it done. [facility] sales the at home test. Hes wondering if that would be fine or if he should go have a rapid test done.

## 2019-10-22 NOTE — Telephone Encounter (Signed)
I did reach out to the patient he was able to get rapid test he will let us know if he is having any troubles

## 2019-10-28 ENCOUNTER — Encounter: Payer: Self-pay | Admitting: Emergency Medicine

## 2019-10-28 ENCOUNTER — Ambulatory Visit
Admission: EM | Admit: 2019-10-28 | Discharge: 2019-10-28 | Disposition: A | Discharge status: Home / Self Care | Payer: PRIVATE HEALTH INSURANCE | Attending: Emergency Medicine | Admitting: Emergency Medicine

## 2019-10-28 DIAGNOSIS — J069 Acute upper respiratory infection, unspecified: Secondary | ICD-10-CM | POA: Insufficient documentation

## 2019-10-28 DIAGNOSIS — K122 Cellulitis and abscess of mouth: Secondary | ICD-10-CM | POA: Insufficient documentation

## 2019-10-28 LAB — POCT RAPID STREP A (OFFICE): Rapid Strep A Screen: NEGATIVE

## 2019-10-28 MED ORDER — LIDOCAINE VISCOUS HCL 2 % MT SOLN: 15.0000 mL | OROMUCOSAL | 1 refills | Status: DC | PRN

## 2019-10-28 MED ORDER — CEPACOL REGULAR STRENGTH 3 MG MT LOZG: 1.0000 | LOZENGE | OROMUCOSAL | 0 refills | Status: DC | PRN

## 2019-10-28 MED ORDER — AZITHROMYCIN 250 MG PO TABS
250.0000 mg | ORAL_TABLET | Freq: Every day | ORAL | 0 refills | Status: DC
Start: 2019-10-28 — End: 2020-02-04

## 2019-10-28 NOTE — ED Triage Notes (Signed)
Uvula swelling started last night.  Has had a cough, runny nose, sore throat.  Has been COVID tested and it was negative.

## 2019-10-28 NOTE — Discharge Instructions (Addendum)
Strep test negative, will send out for culture and we will call you with results Get plenty of rest and push fluids Use OTC Zyrtec . Use daily for symptomatic relief Viscous lidocaine prescribed.  This is an oral solution you can swish, and gargle as needed for symptomatic relief of sore throat.  Do not exceed 8 doses in a 24 hour period.  Do not use prior to eating, as this will numb your entire mouth.  Cepacol and azithromycin were prescribed Drink warm or cool liquids, use throat lozenges, or popsicles to help alleviate symptoms Take OTC ibuprofen or tylenol as needed for pain Follow up with PCP if symptoms persists Return or go to ER if patient has any new or worsening symptoms such as fever, chills, nausea, vomiting, worsening sore throat, cough, abdominal pain, chest pain, changes in bowel or bladder habits, etc..Marland Kitchen

## 2019-10-28 NOTE — ED Provider Notes (Signed)
[facility]   [id number] 10/28/19 Arrival Time: 0807  Chief Complaint  Patient presents with   uvula swelling      SUBJECTIVE: History from: patient.  Blake [name] is a 39 y.o. male who presented to the urgent care with a complaint of uvula swelling that started last night.  Report upper respiratory symptoms such as cough runny nose and congestion.  Denies sick exposure to strep, flu or mono, or precipitating event.  Has tested negative for COVID-19.  Has tried OTC medication without relief.  Symptoms are made worse with swallowing, but tolerating liquids and own secretions without difficulty.  Denies previous symptoms in the past.   Denies fever, chills, fatigue, ear pain, sinus pain, rhinorrhea, SOB, wheezing, chest pain, nausea, rash, changes in bowel or bladder habits.     ROS: As per HPI.  All other pertinent ROS negative.     Past Medical History:  Diagnosis Date   Depression    Elevated ferritin 06/25/2019   High blood cholesterol level    Sleep apnea 01/07/13   cpap   Tobacco abuse 11/19/2018   Patient working very hard at quitting   Type 2 diabetes mellitus (HCC) 11/19/2018   Past Surgical History:  Procedure Laterality Date   ABDOMINAL SURGERY     APPENDECTOMY     CHOLECYSTECTOMY     Allergies  Allergen Reactions   Penicillins Other (See Comments)    Unknown childhood reaction   No current facility-administered medications on file prior to encounter.   Current Outpatient Medications on File Prior to Encounter  Medication Sig Dispense Refill   fenofibrate 160 MG tablet Take 1 tablet (160 mg total) by mouth daily. 90 tablet 1   glucose blood test strip Test glucose once a day DX: E11.9 50 each 5   lisinopril (ZESTRIL) 10 MG tablet Take 1 tablet (10 mg total) by mouth daily. 90 tablet 1   metFORMIN (GLUCOPHAGE) 500 MG tablet TAKE 2 TABLETS BY MOUTH TWICE DAILY 360 tablet 1   rosuvastatin (CRESTOR) 40 MG tablet TAKE 1 TABLET(40 MG)  BY MOUTH DAILY 90 tablet 1   varenicline (CHANTIX CONTINUING MONTH PAK) 1 MG tablet Take 1 tablet (1 mg total) by mouth 2 (two) times daily. 60 tablet 1   varenicline (CHANTIX STARTING MONTH PAK) 0.5 MG X 11 & 1 MG X 42 tablet Take one 0.5 mg tablet by mouth once daily for 3 days, then increase to one 0.5 mg tablet twice daily for 4 days, then increase to one 1 mg tablet twice daily. 53 tablet 0   Social History   Socioeconomic History   Marital status: Married    Spouse name: Not on file   Number of children: Not on file   Years of education: Not on file   Highest education level: Not on file  Occupational History   Not on file  Tobacco Use   Smoking status: Former Smoker    Quit date: 03/01/2014    Years since quitting: 5.6   Smokeless tobacco: Never Used  Substance and Sexual Activity   Alcohol use: Yes    Alcohol/week: 0.0 standard drinks    Comment: occas   Drug use: No   Sexual activity: Yes  Other Topics Concern   Not on file  Social History Narrative   Not on file   Social Determinants of Health   Financial Resource Strain:    Difficulty of Paying Living Expenses:   Food Insecurity:    Worried About  Running Out of Food in the Last Year:    Barista in the Last Year:   Transportation Needs:    Freight forwarder (Medical):    Lack of Transportation (Non-Medical):   Physical Activity:    Days of Exercise per Week:    Minutes of Exercise per Session:   Stress:    Feeling of Stress :   Social Connections:    Frequency of Communication with Friends and Family:    Frequency of Social Gatherings with Friends and Family:    Attends Religious Services:    Active Member of Clubs or Organizations:    Attends Engineer, structural:    Marital Status:   Intimate Partner Violence:    Fear of Current or Ex-Partner:    Emotionally Abused:    Physically Abused:    Sexually Abused:    Family History  Problem Relation  Age of Onset   Diabetes Father     OBJECTIVE:  Vitals:   10/28/19 0816 10/28/19 0817 10/28/19 0818  BP:  (!) 154/97   Pulse:  82   Resp:  18   Temp: 98 F (36.7 C)    TempSrc:  Oral   SpO2:  96%   Weight:   250 lb (113.4 kg)     General appearance: alert; appears fatigued, but nontoxic, speaking in full sentences and managing own secretions HEENT: NCAT; Ears: EACs clear, TMs pearly gray with visible cone of light, without erythema; Eyes: PERRL, EOMI grossly; Nose: no obvious rhinorrhea; Throat: oropharynx clear, tonsils 1+ and mildly erythematous without white tonsillar exudates, uvula swollen in midline Neck: supple without LAD Lungs: CTA bilaterally without adventitious breath sounds; cough present Heart: regular rate and rhythm.  Radial pulses 2+ symmetrical bilaterally Skin: warm and dry Psychological: alert and cooperative; normal mood and affect  LABS: No results found for this or any previous visit (from the past 24 hour(s)).   ASSESSMENT & PLAN:  1. Uvulitis   2. URI with cough and congestion     Meds ordered this encounter  Medications   lidocaine (XYLOCAINE) 2 % solution    Sig: Use as directed 15 mLs in the mouth or throat as needed for mouth pain.    Dispense:  100 mL    Refill:  1   azithromycin (ZITHROMAX) 250 MG tablet    Sig: Take 1 tablet (250 mg total) by mouth daily. Take first 2 tablets together, then 1 every day until finished.    Dispense:  6 tablet    Refill:  0   menthol-cetylpyridinium (CEPACOL REGULAR STRENGTH) 3 MG lozenge    Sig: Take 1 lozenge (3 mg total) by mouth as needed for sore throat.    Dispense:  100 tablet    Refill:  0   Patient is stable for discharge.  He is allergic to penicillin, will prescribe azithromycin, lidocaine mouthwash and Cepacol lozenges.  He was advised to follow PCP.  Discharge Instructions  Strep test negative, will send out for culture and we will call you with results Get plenty of rest and push  fluids Use OTC Zyrtec . Use daily for symptomatic relief Viscous lidocaine prescribed.  This is an oral solution you can swish, and gargle as needed for symptomatic relief of sore throat.  Do not exceed 8 doses in a 24 hour period.  Do not use prior to eating, as this will numb your entire mouth.  Cepacol and azithromycin were prescribed Drink warm or cool  liquids, use throat lozenges, or popsicles to help alleviate symptoms Take OTC ibuprofen or tylenol as needed for pain Follow up with PCP if symptoms persists Return or go to ER if patient has any new or worsening symptoms such as fever, chills, nausea, vomiting, worsening sore throat, cough, abdominal pain, chest pain, changes in bowel or bladder habits, etc...  Reviewed expectations re: course of current medical issues. Questions answered. Outlined signs and symptoms indicating need for more acute intervention. Patient verbalized understanding. After Visit Summary given.     Note: This document was prepared using Dragon voice recognition software and may include unintentional dictation errors.    Durward Parcel, FNP 10/28/19 [phone]

## 2019-10-31 LAB — CULTURE, GROUP A STREP (THRC)

## 2019-11-02 ENCOUNTER — Other Ambulatory Visit: Payer: Self-pay | Admitting: *Deleted

## 2019-11-02 MED ORDER — LISINOPRIL 10 MG PO TABS: 10.0000 mg | ORAL_TABLET | Freq: Every day | ORAL | 0 refills | Status: DC

## 2019-11-02 MED ORDER — FENOFIBRATE 160 MG PO TABS: 160.0000 mg | ORAL_TABLET | Freq: Every day | ORAL | 0 refills | Status: DC

## 2019-11-02 MED ORDER — ROSUVASTATIN CALCIUM 40 MG PO TABS: ORAL_TABLET | ORAL | 0 refills | Status: DC

## 2019-11-02 MED ORDER — METFORMIN HCL 500 MG PO TABS: ORAL_TABLET | ORAL | 0 refills | Status: DC

## 2019-11-11 DIAGNOSIS — Z029 Encounter for administrative examinations, unspecified: Secondary | ICD-10-CM

## 2019-11-12 ENCOUNTER — Telehealth: Payer: Self-pay | Admitting: Family Medicine

## 2019-11-12 NOTE — Telephone Encounter (Signed)
Patient dropped off FMLA to be completed for him to be out with wife who is having surgery on 8/13. I filled in what I could. Patient's wife Raynelle Fanning was seen today. Forms are in your folder with the copy of the last one from 2020.

## 2019-11-15 NOTE — Telephone Encounter (Signed)
FMLA form was completed thank you

## 2019-11-16 NOTE — Telephone Encounter (Signed)
Faxed over form this morning.

## 2019-12-14 ENCOUNTER — Telehealth: Payer: Self-pay | Admitting: Family Medicine

## 2019-12-14 DIAGNOSIS — E7849 Other hyperlipidemia: Secondary | ICD-10-CM

## 2019-12-14 DIAGNOSIS — Z79899 Other long term (current) drug therapy: Secondary | ICD-10-CM

## 2019-12-14 DIAGNOSIS — E119 Type 2 diabetes mellitus without complications: Secondary | ICD-10-CM

## 2019-12-14 NOTE — Telephone Encounter (Signed)
The patient has a follow-up visit later in October Patient is due for a lipid liver metabolic 7 A1c, urine micro protein Patient should try to do these somewhere near the end of the month and keep his follow-up visit in October  Diabetes hyperlipidemia hypertension

## 2019-12-15 NOTE — Addendum Note (Signed)
Addended by: Metro Kung on: 12/15/2019 08:53 AM   Modules accepted: Orders

## 2019-12-15 NOTE — Telephone Encounter (Signed)
Discussed with pt and orders were put in.

## 2020-01-08 ENCOUNTER — Other Ambulatory Visit: Payer: Self-pay | Admitting: Family Medicine

## 2020-01-08 NOTE — Telephone Encounter (Signed)
90 day each only

## 2020-01-13 LAB — HEPATIC FUNCTION PANEL
ALT: 61 IU/L — ABNORMAL HIGH (ref 0–44)
AST: 36 IU/L (ref 0–40)
Albumin: 4.5 g/dL (ref 4.0–5.0)
Alkaline Phosphatase: 58 IU/L (ref 44–121)
Bilirubin Total: 0.2 mg/dL (ref 0.0–1.2)
Bilirubin, Direct: 0.14 mg/dL (ref 0.00–0.40)
Total Protein: 6.7 g/dL (ref 6.0–8.5)

## 2020-01-13 LAB — MICROALBUMIN / CREATININE URINE RATIO
Creatinine, Urine: 261.1 mg/dL
Microalb/Creat Ratio: 21 mg/g creat (ref 0–29)
Microalbumin, Urine: 54.1 ug/mL

## 2020-01-13 LAB — BASIC METABOLIC PANEL
BUN/Creatinine Ratio: 14 (ref 9–20)
BUN: 13 mg/dL (ref 6–20)
CO2: 18 mmol/L — ABNORMAL LOW (ref 20–29)
Calcium: 9.6 mg/dL (ref 8.7–10.2)
Chloride: 102 mmol/L (ref 96–106)
Creatinine, Ser: 0.9 mg/dL (ref 0.76–1.27)
GFR calc Af Amer: 126 mL/min/{1.73_m2} (ref >59)
GFR calc non Af Amer: 109 mL/min/{1.73_m2} (ref >59)
Glucose: 178 mg/dL — ABNORMAL HIGH (ref 65–99)
Potassium: 4.4 mmol/L (ref 3.5–5.2)
Sodium: 139 mmol/L (ref 134–144)

## 2020-01-13 LAB — LIPID PANEL
Chol/HDL Ratio: 7.2 ratio — ABNORMAL HIGH (ref 0.0–5.0)
Cholesterol, Total: 195 mg/dL (ref 100–199)
HDL: 27 mg/dL — ABNORMAL LOW (ref >39)
Triglycerides: 1023 mg/dL (ref 0–149)

## 2020-01-13 LAB — HEMOGLOBIN A1C
Est. average glucose Bld gHb Est-mCnc: 154 mg/dL
Hgb A1c MFr Bld: 7 % — ABNORMAL HIGH (ref 4.8–5.6)

## 2020-02-04 ENCOUNTER — Other Ambulatory Visit: Payer: Self-pay

## 2020-02-04 ENCOUNTER — Ambulatory Visit (INDEPENDENT_AMBULATORY_CARE_PROVIDER_SITE_OTHER): Payer: PRIVATE HEALTH INSURANCE | Admitting: Family Medicine

## 2020-02-04 ENCOUNTER — Encounter: Payer: Self-pay | Admitting: Family Medicine

## 2020-02-04 VITALS — BP 144/88 | HR 99 | Temp 97.9°F | Wt 260.6 lb

## 2020-02-04 DIAGNOSIS — E781 Pure hyperglyceridemia: Secondary | ICD-10-CM

## 2020-02-04 DIAGNOSIS — Z0000 Encounter for general adult medical examination without abnormal findings: Secondary | ICD-10-CM

## 2020-02-04 MED ORDER — LISINOPRIL 20 MG PO TABS: 20.0000 mg | ORAL_TABLET | Freq: Every day | ORAL | 1 refills | Status: DC

## 2020-02-04 MED ORDER — FENOFIBRATE 160 MG PO TABS: 160.0000 mg | ORAL_TABLET | Freq: Every day | ORAL | 1 refills | Status: DC

## 2020-02-04 MED ORDER — METFORMIN HCL 500 MG PO TABS: 1000.0000 mg | ORAL_TABLET | Freq: Two times a day (BID) | ORAL | 1 refills | Status: DC

## 2020-02-04 MED ORDER — ROSUVASTATIN CALCIUM 40 MG PO TABS: ORAL_TABLET | ORAL | 1 refills | Status: DC

## 2020-02-04 NOTE — Progress Notes (Signed)
Subjective:    Patient ID: Blake [name], male    DOB: [birth year], 39 y.o.   MRN: [id number]  HPI  The patient comes in today for a wellness visit. Very nice patient Under a lot of stress Works hard Takes care of his family Wife is disabled I   A review of their health history was completed.  A review of medications was also completed.  Any needed refills; lisinopril  Eating habits: fair  Falls/  MVA accidents in past few months: none  Regular exercise: walking and exercises 2 x per week   Specialist pt sees on regular basis: none  Preventative health issues were discussed.   Additional concerns: none  Review of Systems  Constitutional: Negative for diaphoresis and fatigue.  HENT: Negative for congestion and rhinorrhea.   Respiratory: Negative for cough and shortness of breath.   Cardiovascular: Negative for chest pain and leg swelling.  Gastrointestinal: Negative for abdominal pain and diarrhea.  Skin: Negative for color change and rash.  Neurological: Negative for dizziness and headaches.  Psychiatric/Behavioral: Negative for behavioral problems and confusion.       Objective:   Physical Exam Vitals reviewed.  Cardiovascular:     Rate and Rhythm: Normal rate and regular rhythm.     Heart sounds: Normal heart sounds. No murmur heard.   Pulmonary:     Effort: Pulmonary effort is normal.     Breath sounds: Normal breath sounds.  Lymphadenopathy:     Cervical: No cervical adenopathy.  Neurological:     Mental Status: He is alert.  Psychiatric:        Behavior: Behavior normal.    Results for orders placed or performed in visit on 12/14/19  Lipid panel  Result Value Ref Range   Cholesterol, Total 195 100 - 199 mg/dL   Triglycerides 0,254 (HH) 0 - 149 mg/dL   HDL 27 (L) >27 mg/dL   VLDL Cholesterol Cal Comment (A) 5 - 40 mg/dL   LDL Chol Calc (NIH) Comment (A) 0 - 99 mg/dL   Chol/HDL Ratio 7.2 (H) 0.0 - 5.0 ratio  Hepatic function panel  Result  Value Ref Range   Total Protein 6.7 6.0 - 8.5 g/dL   Albumin 4.5 4.0 - 5.0 g/dL   Bilirubin Total 0.2 0.0 - 1.2 mg/dL   Bilirubin, Direct 0.62 0.00 - 0.40 mg/dL   Alkaline Phosphatase 58 44 - 121 IU/L   AST 36 0 - 40 IU/L   ALT 61 (H) 0 - 44 IU/L  Basic metabolic panel  Result Value Ref Range   Glucose 178 (H) 65 - 99 mg/dL   BUN 13 6 - 20 mg/dL   Creatinine, Ser 3.76 0.76 - 1.27 mg/dL   GFR calc non Af Amer 109 >59 mL/min/1.73   GFR calc Af Amer 126 >59 mL/min/1.73   BUN/Creatinine Ratio 14 9 - 20   Sodium 139 134 - 144 mmol/L   Potassium 4.4 3.5 - 5.2 mmol/L   Chloride 102 96 - 106 mmol/L   CO2 18 (L) 20 - 29 mmol/L   Calcium 9.6 8.7 - 10.2 mg/dL  Hemoglobin E8B  Result Value Ref Range   Hgb A1c MFr Bld 7.0 (H) 4.8 - 5.6 %   Est. average glucose Bld gHb Est-mCnc 154 mg/dL  Microalbumin / creatinine urine ratio  Result Value Ref Range   Creatinine, Urine 261.1 Not Estab. mg/dL   Microalbumin, Urine 15.1 Not Estab. ug/mL   Microalb/Creat Ratio 21 0 - 29  mg/g creat          Assessment & Plan:  Adult wellness-complete.wellness physical was conducted today. Importance of diet and exercise were discussed in detail.  In addition to this a discussion regarding safety was also covered. We also reviewed over immunizations and gave recommendations regarding current immunization needed for age.  In addition to this additional areas were also touched on including: Preventative health exams needed:  Colonoscopy not indicated  Patient was advised yearly wellness exam Patient consuming too much alcohol he will work hard at cutting back and try to cut it out altogether Patient will also work hard at [employer] he will repeat lipid profile in 6 weeks if it is not significantly improved add additional medicines  He will cut back on starches cut back on alcohol and he will follow-up again in approximately 4 months or check lab work including A1c  Also encourage patient to do some  counseling through [facility] but he will consider this and let us know

## 2020-02-05 ENCOUNTER — Other Ambulatory Visit: Payer: Self-pay | Admitting: Family Medicine

## 2020-02-29 ENCOUNTER — Other Ambulatory Visit: Payer: Self-pay | Admitting: Family Medicine

## 2020-03-09 ENCOUNTER — Encounter: Payer: Self-pay | Admitting: Family Medicine

## 2020-03-09 ENCOUNTER — Other Ambulatory Visit: Payer: Self-pay | Admitting: *Deleted

## 2020-03-09 MED ORDER — LISINOPRIL 20 MG PO TABS: 20.0000 mg | ORAL_TABLET | Freq: Every day | ORAL | 1 refills | Status: DC

## 2020-03-09 NOTE — Telephone Encounter (Signed)
Script written and waiting for dr signature.

## 2020-03-22 ENCOUNTER — Encounter: Payer: Self-pay | Admitting: Family Medicine

## 2020-03-22 ENCOUNTER — Ambulatory Visit (INDEPENDENT_AMBULATORY_CARE_PROVIDER_SITE_OTHER): Payer: PRIVATE HEALTH INSURANCE | Admitting: Family Medicine

## 2020-03-22 VITALS — BP 134/72 | HR 94 | Ht 71.0 in | Wt 254.0 lb

## 2020-03-22 DIAGNOSIS — Z7289 Other problems related to lifestyle: Secondary | ICD-10-CM

## 2020-03-22 DIAGNOSIS — F439 Reaction to severe stress, unspecified: Secondary | ICD-10-CM

## 2020-03-22 DIAGNOSIS — Z789 Other specified health status: Secondary | ICD-10-CM

## 2020-03-22 DIAGNOSIS — Z6835 Body mass index (BMI) 35.0-35.9, adult: Secondary | ICD-10-CM

## 2020-03-22 DIAGNOSIS — E781 Pure hyperglyceridemia: Secondary | ICD-10-CM | POA: Diagnosis not present

## 2020-03-22 DIAGNOSIS — K760 Fatty (change of) liver, not elsewhere classified: Secondary | ICD-10-CM

## 2020-03-22 DIAGNOSIS — F1090 Alcohol use, unspecified, uncomplicated: Secondary | ICD-10-CM

## 2020-03-22 LAB — LIPID PANEL
Chol/HDL Ratio: 5.3 ratio — ABNORMAL HIGH (ref 0.0–5.0)
Cholesterol, Total: 159 mg/dL (ref 100–199)
HDL: 30 mg/dL — ABNORMAL LOW (ref >39)
LDL Chol Calc (NIH): 47 mg/dL (ref 0–99)
Triglycerides: 564 mg/dL (ref 0–149)
VLDL Cholesterol Cal: 82 mg/dL — ABNORMAL HIGH (ref 5–40)

## 2020-03-22 MED ORDER — ROSUVASTATIN CALCIUM 40 MG PO TABS: ORAL_TABLET | ORAL | 1 refills | Status: DC

## 2020-03-22 MED ORDER — FLUOXETINE HCL 10 MG PO CAPS: 10.0000 mg | ORAL_CAPSULE | Freq: Every day | ORAL | 1 refills | Status: DC

## 2020-03-22 MED ORDER — METFORMIN HCL 500 MG PO TABS: 1000.0000 mg | ORAL_TABLET | Freq: Two times a day (BID) | ORAL | 1 refills | Status: DC

## 2020-03-22 NOTE — Progress Notes (Signed)
Subjective:    Patient ID: Blake [name], male    DOB: December 29, 1980, 39 y.o.   MRN: [id number] Very nice gentleman HPIFollow up. Pt states he would like to discuss his issues with drinking.  Patient intermittently throughout the week and drinks more than what he should 2 or 3 beers at a time he used to be every day but now he is cut it back to a couple days per week unfortunately he has underlying health issues including blood pressure weight and liver that any consumption of alcohol can make his overall health worse.  He denies any chest pressure tightness pain denies wheezing vomiting hematuria rectal bleeding  Results for orders placed or performed in visit on 02/04/20  Lipid panel  Result Value Ref Range   Cholesterol, Total 159 100 - 199 mg/dL   Triglycerides 263 (HH) 0 - 149 mg/dL   HDL 30 (L) >78 mg/dL   VLDL Cholesterol Cal 82 (H) 5 - 40 mg/dL   LDL Chol Calc (NIH) 47 0 - 99 mg/dL   Chol/HDL Ratio 5.3 (H) 0.0 - 5.0 ratio     Review of Systems  Constitutional: Negative for activity change.  HENT: Negative for congestion and rhinorrhea.   Respiratory: Negative for cough and shortness of breath.   Cardiovascular: Negative for chest pain.  Gastrointestinal: Negative for abdominal pain, diarrhea, nausea and vomiting.  Genitourinary: Negative for dysuria and hematuria.  Neurological: Negative for weakness and headaches.  Psychiatric/Behavioral: Negative for behavioral problems and confusion.       Objective:   Physical Exam Vitals reviewed.  Constitutional:      General: He is not in acute distress.    Appearance: He is well-nourished.  HENT:     Head: Normocephalic and atraumatic.  Eyes:     General:        Right eye: No discharge.        Left eye: No discharge.  Neck:     Trachea: No tracheal deviation.  Cardiovascular:     Rate and Rhythm: Normal rate and regular rhythm.     Heart sounds: Normal heart sounds. No murmur heard.   Pulmonary:     Effort:  Pulmonary effort is normal. No respiratory distress.     Breath sounds: Normal breath sounds.  Musculoskeletal:        General: No edema.  Lymphadenopathy:     Cervical: No cervical adenopathy.  Skin:    General: Skin is warm and dry.  Neurological:     Mental Status: He is alert.     Coordination: Coordination normal.  Psychiatric:        Mood and Affect: Mood and affect normal.        Behavior: Behavior normal.           Assessment & Plan:  1. Fatty liver Patient should do his best to avoid all alcohol use.  He is under a lot of stress and because of this it is contributing to his situation that makes him more prone to utilize alcohol.  See below  2. Hypertriglyceridemia Triglyceride look better but actually could be even better if he got away from alcohol this was discussed in detail continue current medication  3. Alcohol use Very important to get away from alcohol.  If not improving over the course of the next several weeks consider naloxone or potentially Campral  4. Class 2 severe obesity with serious comorbidity and body mass index (BMI) of 35.0 to 35.9 in adult,  unspecified obesity type (HCC) Portion control regular activity Not suicidal Significant stress along with mild anxiety with feeling on edge a lot with quick agitation at home recommend low-dose medication we will start off with Prozac 10 mg daily patient will give [place] feedback on how well that is going follow-up again in 4 to 6 weeks to see how this is going plus alcohol use

## 2020-05-03 ENCOUNTER — Other Ambulatory Visit: Payer: Self-pay

## 2020-05-03 ENCOUNTER — Telehealth (INDEPENDENT_AMBULATORY_CARE_PROVIDER_SITE_OTHER): Payer: PRIVATE HEALTH INSURANCE | Admitting: Family Medicine

## 2020-05-03 DIAGNOSIS — F439 Reaction to severe stress, unspecified: Secondary | ICD-10-CM | POA: Diagnosis not present

## 2020-05-03 DIAGNOSIS — E119 Type 2 diabetes mellitus without complications: Secondary | ICD-10-CM

## 2020-05-03 DIAGNOSIS — E7849 Other hyperlipidemia: Secondary | ICD-10-CM

## 2020-05-03 DIAGNOSIS — E781 Pure hyperglyceridemia: Secondary | ICD-10-CM

## 2020-05-03 MED ORDER — SERTRALINE HCL 50 MG PO TABS
50.0000 mg | ORAL_TABLET | Freq: Every day | ORAL | 3 refills | Status: DC
Start: 2020-05-03 — End: 2020-08-03

## 2020-05-03 NOTE — Progress Notes (Signed)
   Subjective:    Patient ID: Blake [name], male    DOB: May 05, 1980, 40 y.o.   MRN: [id number]  HPI Patient calls for a follow up on fatty liver. Patient also states he was unable to take Prozac because it mde his mood worse worse temper. Hypertriglyceridemia  Type 2 diabetes mellitus without complication, without long-term current use of insulin (HCC) - Plan: Comprehensive metabolic panel, Hemoglobin A1c  Other hyperlipidemia - Plan: Lipid panel  Stress at home   He is trying to work hard at trying to lessen the amount of anger outbursts he has we did talk about medication versus counseling he is interested in the possibility of counseling he will look into that through his workplace but if that is not available he will connect with [place] we will help set up counseling  He has dramatically cut back on alcohol only has it occasionally when he goes out  We will be best to do comprehensive lab work before the next visit Virtual Visit via Video Note  I connected with Blake [name] on 05/03/20 at  8:20 AM EST by a video enabled telemedicine application and verified that I am speaking with the correct person using two identifiers.  Location: Patient: home Provider: office   I discussed the limitations of evaluation and management by telemedicine and the availability of in person appointments. The patient expressed understanding and agreed to proceed.  History of Present Illness:    Observations/Objective:   Assessment and Plan:   Follow Up Instructions:    I discussed the assessment and treatment plan with the patient. The patient was provided an opportunity to ask questions and all were answered. The patient agreed with the plan and demonstrated an understanding of the instructions.   The patient was advised to call back or seek an in-person evaluation if the symptoms worsen or if the condition fails to improve as anticipated.  I provided 25 face-to-face time chart  review documentation minutes of non-face-to-face time during this encounter.       Review of Systems     Objective:   Physical Exam   Patient had virtual visit-video Appears to be in no distress Atraumatic Neuro able to relate and oriented No apparent resp distress Color normal      Assessment & Plan:  Behavior-not depressed but still having feeling on edge especially with his son Blake [name]. He is trying hard. I suggested counseling he is looking into the options at work versus are setting up counseling  The Prozac caused him to feel angry and on edge. We truly want to try medication to help we will try sertraline 50 mg one half daily for the first week then 1 daily thereafter  Patient will give [place] my chart updates if he feels the medication not getting along with him more or if the medication is causing problems  We will do comprehensive lab work before the next visit and the patient will do a video visit in March  Patient doing an excellent job of cutting back on alcohol.

## 2020-07-04 ENCOUNTER — Telehealth: Payer: PRIVATE HEALTH INSURANCE | Admitting: Family Medicine

## 2020-07-23 ENCOUNTER — Other Ambulatory Visit: Payer: Self-pay | Admitting: Family Medicine

## 2020-07-31 ENCOUNTER — Other Ambulatory Visit: Payer: Self-pay | Admitting: Family Medicine

## 2020-08-01 ENCOUNTER — Encounter (HOSPITAL_COMMUNITY): Payer: Self-pay

## 2020-08-01 ENCOUNTER — Emergency Department (HOSPITAL_COMMUNITY)
Admission: EM | Admit: 2020-08-01 | Discharge: 2020-08-01 | Disposition: A | Discharge status: Home / Self Care | Payer: PRIVATE HEALTH INSURANCE | Attending: Emergency Medicine | Admitting: Emergency Medicine

## 2020-08-01 ENCOUNTER — Other Ambulatory Visit: Payer: Self-pay

## 2020-08-01 DIAGNOSIS — Z7984 Long term (current) use of oral hypoglycemic drugs: Secondary | ICD-10-CM | POA: Insufficient documentation

## 2020-08-01 DIAGNOSIS — R21 Rash and other nonspecific skin eruption: Secondary | ICD-10-CM | POA: Insufficient documentation

## 2020-08-01 DIAGNOSIS — R1084 Generalized abdominal pain: Secondary | ICD-10-CM | POA: Diagnosis not present

## 2020-08-01 DIAGNOSIS — R197 Diarrhea, unspecified: Secondary | ICD-10-CM | POA: Insufficient documentation

## 2020-08-01 DIAGNOSIS — Z9049 Acquired absence of other specified parts of digestive tract: Secondary | ICD-10-CM | POA: Diagnosis not present

## 2020-08-01 DIAGNOSIS — Z9189 Other specified personal risk factors, not elsewhere classified: Secondary | ICD-10-CM

## 2020-08-01 DIAGNOSIS — E119 Type 2 diabetes mellitus without complications: Secondary | ICD-10-CM | POA: Insufficient documentation

## 2020-08-01 DIAGNOSIS — M7918 Myalgia, other site: Secondary | ICD-10-CM | POA: Diagnosis not present

## 2020-08-01 DIAGNOSIS — A681 Tick-borne relapsing fever: Secondary | ICD-10-CM | POA: Diagnosis not present

## 2020-08-01 DIAGNOSIS — Z20822 Contact with and (suspected) exposure to covid-19: Secondary | ICD-10-CM | POA: Diagnosis not present

## 2020-08-01 DIAGNOSIS — Z87891 Personal history of nicotine dependence: Secondary | ICD-10-CM | POA: Diagnosis not present

## 2020-08-01 DIAGNOSIS — M791 Myalgia, unspecified site: Secondary | ICD-10-CM

## 2020-08-01 DIAGNOSIS — R109 Unspecified abdominal pain: Secondary | ICD-10-CM | POA: Diagnosis present

## 2020-08-01 DIAGNOSIS — R110 Nausea: Secondary | ICD-10-CM | POA: Diagnosis not present

## 2020-08-01 DIAGNOSIS — R509 Fever, unspecified: Secondary | ICD-10-CM

## 2020-08-01 LAB — CBC WITH DIFFERENTIAL/PLATELET
Abs Immature Granulocytes: 0.06 10*3/uL (ref 0.00–0.07)
Basophils Absolute: 0 10*3/uL (ref 0.0–0.1)
Basophils Relative: 1 %
Eosinophils Absolute: 0.3 10*3/uL (ref 0.0–0.5)
Eosinophils Relative: 5 %
HCT: 41.9 % (ref 39.0–52.0)
Hemoglobin: 14.1 g/dL (ref 13.0–17.0)
Immature Granulocytes: 1 %
Lymphocytes Relative: 20 %
Lymphs Abs: 1.3 10*3/uL (ref 0.7–4.0)
MCH: 31.1 pg (ref 26.0–34.0)
MCHC: 33.7 g/dL (ref 30.0–36.0)
MCV: 92.5 fL (ref 80.0–100.0)
Monocytes Absolute: 0.6 10*3/uL (ref 0.1–1.0)
Monocytes Relative: 10 %
Neutro Abs: 4.3 10*3/uL (ref 1.7–7.7)
Neutrophils Relative %: 63 %
Platelets: 212 10*3/uL (ref 150–400)
RBC: 4.53 MIL/uL (ref 4.22–5.81)
RDW: 13 % (ref 11.5–15.5)
WBC: 6.6 10*3/uL (ref 4.0–10.5)
nRBC: 0 % (ref 0.0–0.2)

## 2020-08-01 LAB — URINALYSIS, ROUTINE W REFLEX MICROSCOPIC
Bacteria, UA: NONE SEEN
Bilirubin Urine: NEGATIVE
Glucose, UA: >=500 mg/dL — AB
Hgb urine dipstick: NEGATIVE
Ketones, ur: NEGATIVE mg/dL
Leukocytes,Ua: NEGATIVE
Nitrite: NEGATIVE
Protein, ur: NEGATIVE mg/dL
Specific Gravity, Urine: 1.026 (ref 1.005–1.030)
pH: 5 (ref 5.0–8.0)

## 2020-08-01 LAB — COMPREHENSIVE METABOLIC PANEL
ALT: 53 U/L — ABNORMAL HIGH (ref 0–44)
AST: 48 U/L — ABNORMAL HIGH (ref 15–41)
Albumin: 3.7 g/dL (ref 3.5–5.0)
Alkaline Phosphatase: 67 U/L (ref 38–126)
Anion gap: 10 (ref 5–15)
BUN: 5 mg/dL — ABNORMAL LOW (ref 6–20)
CO2: 20 mmol/L — ABNORMAL LOW (ref 22–32)
Calcium: 8.9 mg/dL (ref 8.9–10.3)
Chloride: 103 mmol/L (ref 98–111)
Creatinine, Ser: 0.74 mg/dL (ref 0.61–1.24)
GFR, Estimated: >60 mL/min (ref >60)
Glucose, Bld: 237 mg/dL — ABNORMAL HIGH (ref 70–99)
Potassium: 4.4 mmol/L (ref 3.5–5.1)
Sodium: 133 mmol/L — ABNORMAL LOW (ref 135–145)
Total Bilirubin: 0.7 mg/dL (ref 0.3–1.2)
Total Protein: 7.1 g/dL (ref 6.5–8.1)

## 2020-08-01 LAB — LIPASE, BLOOD: Lipase: 45 U/L (ref 11–51)

## 2020-08-01 LAB — SARS CORONAVIRUS 2 (TAT 6-24 HRS): SARS Coronavirus 2: NEGATIVE

## 2020-08-01 MED ORDER — SODIUM CHLORIDE 0.9 % IV BOLUS
1000.0000 mL | Freq: Once | INTRAVENOUS | Status: AC
  Administered 2020-08-01: 1000 mL via INTRAVENOUS

## 2020-08-01 MED ORDER — DOXYCYCLINE HYCLATE 100 MG PO CAPS: 100.0000 mg | ORAL_CAPSULE | Freq: Two times a day (BID) | ORAL | 0 refills | Status: AC

## 2020-08-01 MED ORDER — ONDANSETRON 4 MG PO TBDP: ORAL_TABLET | ORAL | 0 refills | Status: DC

## 2020-08-01 MED ORDER — DICYCLOMINE HCL 10 MG PO CAPS
20.0000 mg | ORAL_CAPSULE | Freq: Once | ORAL | Status: AC
  Administered 2020-08-01: 20 mg via ORAL
  Filled 2020-08-01: qty 2

## 2020-08-01 MED ORDER — ONDANSETRON HCL 4 MG/2ML IJ SOLN
4.0000 mg | Freq: Once | INTRAMUSCULAR | Status: AC
  Administered 2020-08-01: 4 mg via INTRAVENOUS
  Filled 2020-08-01: qty 2

## 2020-08-01 MED ORDER — DOXYCYCLINE HYCLATE 100 MG PO TABS
100.0000 mg | ORAL_TABLET | Freq: Once | ORAL | Status: AC
  Administered 2020-08-01: 100 mg via ORAL
  Filled 2020-08-01: qty 1

## 2020-08-01 MED ORDER — DICYCLOMINE HCL 20 MG PO TABS: 20.0000 mg | ORAL_TABLET | Freq: Two times a day (BID) | ORAL | 0 refills | Status: DC

## 2020-08-01 NOTE — Discharge Instructions (Addendum)
I have very high suspicion for [facility] spotted fever or other tickborne illness as the cause of your symptoms.  The rest of your lab work today has been reassuring, your blood sugar was elevated, please continue to take your diabetes medications regularly and follow with your primary care provider.  You will be able to review blood work for tickborne illnesses on MyChart.  Please take doxycycline twice daily for the next 10 days.  If you continue to have worsening symptoms despite antibiotics, or not tolerating p.o. or any other new or concerning symptoms occur return for reevaluation.  You can use Zofran to help with nausea and vomiting and Bentyl to help with cramping abdominal pain.

## 2020-08-01 NOTE — ED Provider Notes (Signed)
[facility] EMERGENCY DEPARTMENT Provider Note   CSN: [id number] Arrival date & time: 08/01/20  0913     History Chief Complaint  Patient presents with  . Abdominal Pain    Blake [name] is a 40 y.o. male.  Blake [name] is a 40 y.o. male with a past medical history of diabetes that presents with abdominal pain, diarrhea, and fevers. Patient states he recently returned home from [place] last Tuesday and was experiencing no symptoms. He went hiking Thursday and Friday morning found a tick on his Left foot. He removed the tick at that time and there was no erythema around the bite site. Friday afternoon he began to experience stomach cramping and diarrhea. The diarrhea is nonbloody. He also endorses no appetite, fever as high at 101 at home, headache, nausea, muscle aches, and a rash on both feet and ankles. He denies any chest pain, difficulty breathing, numbness or tingling in his extremities, vision changes or stiffness.  His wife traveled to [place] with him and they ate all the same foods and she is not having any similar symptoms.        Past Medical History:  Diagnosis Date  . Depression   . Elevated ferritin 06/25/2019  . High blood cholesterol level   . Sleep apnea 01/07/13   cpap  . Tobacco abuse 11/19/2018   Patient working very hard at quitting  . Type 2 diabetes mellitus ([place]) 11/19/2018    Patient Active Problem List   Diagnosis Date Noted  . Elevated ferritin 06/25/2019  . Type 2 diabetes mellitus ([place]) 11/19/2018  . Tobacco abuse 11/19/2018  . Depression, major, single episode, mild ([place]) 11/21/2016  . Obesity (BMI 30-39.9) 05/19/2016  . Obstructive sleep apnea 10/27/2013  . Fatty liver 10/27/2013  . Hypertriglyceridemia 12/15/2012    Past Surgical History:  Procedure Laterality Date  . ABDOMINAL SURGERY    . APPENDECTOMY    . CHOLECYSTECTOMY         Family History  Problem Relation Age of Onset  . Diabetes Father      Social History   Tobacco Use  . Smoking status: Former Smoker    Quit date: 03/01/2014    Years since quitting: 6.4  . Smokeless tobacco: Never Used  Substance Use Topics  . Alcohol use: Yes    Alcohol/week: 0.0 standard drinks    Comment: occas  . Drug use: No    Home Medications Prior to Admission medications   Medication Sig Start Date End Date Taking? Authorizing Provider  dicyclomine (BENTYL) 20 MG tablet Take 1 tablet (20 mg total) by mouth 2 (two) times daily. 08/01/20  Yes Jacqlyn Larsen, PA-C  doxycycline (VIBRAMYCIN) 100 MG capsule Take 1 capsule (100 mg total) by mouth 2 (two) times daily for 10 days. 08/01/20 08/11/20 Yes Jacqlyn Larsen, PA-C  ondansetron (ZOFRAN ODT) 4 MG disintegrating tablet 47m ODT q4 hours prn nausea/vomit 08/01/20  Yes Horton Ellithorpe N, PA-C  fenofibrate 160 MG tablet Take 1 tablet (160 mg total) by mouth daily. 08/03/20   LKathyrn Drown MD  glucose blood test strip Test glucose once a day DX: E11.9 02/24/19   Luking, SElayne Snare MD  lisinopril (ZESTRIL) 20 MG tablet Take 1 tablet (20 mg total) by mouth daily. 08/03/20   LKathyrn Drown MD  metFORMIN (GLUCOPHAGE) 500 MG tablet Take 2 tablets (1,000 mg total) by mouth 2 (two) times daily. 08/03/20   LKathyrn Drown MD  rosuvastatin ([place]  40 MG tablet TAKE 1 TABLET BY MOUTH  DAILY 08/03/20   Kathyrn Drown, MD  sertraline (ZOLOFT) 50 MG tablet Take 1 tablet (50 mg total) by mouth daily. 08/03/20   Kathyrn Drown, MD    Allergies    Penicillins  Review of Systems   Review of Systems  Constitutional: Positive for chills, fatigue and fever.  HENT: Negative.   Respiratory: Negative for cough and shortness of breath.   Cardiovascular: Negative for chest pain.  Gastrointestinal: Positive for abdominal pain, diarrhea and nausea.  Genitourinary: Negative for dysuria.  Skin: Positive for rash.  Neurological: Positive for headaches. Negative for weakness and numbness.  All other systems reviewed and  are negative.   Physical Exam Updated Vital Signs BP (!) 160/103 (BP Location: Right Arm)   Pulse 69   Temp (!) 97.5 F (36.4 C)   Resp 14   SpO2 100%   Physical Exam Vitals and nursing note reviewed.  Constitutional:      General: He is not in acute distress.    Appearance: He is well-developed. He is obese. He is not ill-appearing or diaphoretic.  HENT:     Head: Normocephalic and atraumatic.     Mouth/Throat:     Mouth: Mucous membranes are moist.     Pharynx: Oropharynx is clear.  Eyes:     General:        Right eye: No discharge.        Left eye: No discharge.     Pupils: Pupils are equal, round, and reactive to light.  Cardiovascular:     Rate and Rhythm: Normal rate and regular rhythm.     Heart sounds: Normal heart sounds.  Pulmonary:     Effort: Pulmonary effort is normal. No respiratory distress.     Breath sounds: Normal breath sounds. No wheezing or rales.     Comments: Respirations equal and unlabored, patient able to speak in full sentences, lungs clear to auscultation bilaterally  Abdominal:     General: Bowel sounds are normal. There is no distension.     Palpations: Abdomen is soft. There is no mass.     Tenderness: There is abdominal tenderness. There is no guarding.     Comments: Abdomen is soft, nondistended, bowel sounds present throughout, there is very mild generalized tenderness noted throughout but does not localize to 1 quadrant, no guarding or peritoneal signs  Musculoskeletal:        General: No tenderness or deformity.     Cervical back: Normal range of motion and neck supple. No rigidity.     Right lower leg: No edema.     Left lower leg: No edema.  Skin:    General: Skin is warm and dry.     Capillary Refill: Capillary refill takes less than 2 seconds.     Findings: Rash present.     Comments: Scattered erythematous macules over bilateral feet and ankles, some in circular distributions, no petechiae, no vesicles or pustules.  On the top  of the left foot there is a small scab where tick was previously located no insect remnants seen  Neurological:     Mental Status: He is alert.     Coordination: Coordination normal.     Comments: Speech is clear, able to follow commands Moves extremities without ataxia, coordination intact  Psychiatric:        Mood and Affect: Mood normal.        Behavior: Behavior normal.  ED Results / Procedures / Treatments   Labs (all labs ordered are listed, but only abnormal results are displayed) Labs Reviewed  COMPREHENSIVE METABOLIC PANEL - Abnormal; Notable for the following components:      Result Value   Sodium 133 (*)    CO2 20 (*)    Glucose, Bld 237 (*)    BUN 5 (*)    AST 48 (*)    ALT 53 (*)    All other components within normal limits  URINALYSIS, ROUTINE W REFLEX MICROSCOPIC - Abnormal; Notable for the following components:   Glucose, UA >=500 (*)    All other components within normal limits  SARS CORONAVIRUS 2 (TAT 6-24 HRS)  CBC WITH DIFFERENTIAL/PLATELET  LIPASE, BLOOD  EHRLICHIA ANTIBODY PANEL  LYME DISEASE DNA BY PCR(BORRELIA BURG)  ROCKY MTN SPOTTED FVR ABS PNL(IGG+IGM)    EKG None  Radiology No results found.  Procedures Procedures   Medications Ordered in ED Medications  dicyclomine (BENTYL) capsule 20 mg (20 mg Oral Given 08/01/20 1107)  ondansetron (ZOFRAN) injection 4 mg (4 mg Intravenous Given 08/01/20 1107)  sodium chloride 0.9 % bolus 1,000 mL (0 mLs Intravenous Stopped 08/01/20 1226)  doxycycline (VIBRA-TABS) tablet 100 mg (100 mg Oral Given 08/01/20 1107)    ED Course  I have reviewed the triage vital signs and the nursing notes.  Pertinent labs & imaging results that were available during my care of the patient were reviewed by me and considered in my medical decision making (see chart for details).    MDM Rules/Calculators/A&P                         40 y.o. male presents to the ED with complaints of fevers, myalgias, cramping abdominal  pain, diarrhea and rash, this involves an extensive number of treatment options, and is a complaint that carries with it a high risk of complications and morbidity.  The differential diagnosis includes gastroenteritis, food poisoning, tickborne illness, colitis, IBD  On arrival pt is nontoxic, vitals Significant for hypertension, but otherwise unremarkable. Exam significant for mild generalized abdominal tenderness, no focal pain or guarding, faint macular rash over the feet and ankles  Additional history obtained from patient's wife. Previous records obtained and reviewed via EMR  I ordered doxycycline for potential tickborne illness, IV fluids, Zofran and Bentyl for symptomatic treatment  Lab Tests:  I Ordered, reviewed, and interpreted labs, which included:  CBC: No leukocytosis, normal hemoglobin CMP: Glucose 237, CO2 20, normal anion gap, minimal elevation in AST and ALT, normal bili and alk phos Lipase: WNL UA: No signs of infection  Testing for [facility] spotted fever, Lyme disease and ehrlichiosis pending    ED Course:   Patient feeling better with symptomatic treatment here in the ED, high clinical suspicion for tickborne illness, RMSF in particular, will treat patient with 10-day course of doxycycline.  Feel that traveler's diarrhea is less likely as wife is not experiencing any symptoms, and all of this began a few days after being back from trip to the [place].  Zofran and Bentyl prescribed for symptom management.  PCP follow-up and return precautions discussed.  Discharged home in good condition.   Portions of this note were generated with [employer]. Dictation errors may occur despite best attempts at proofreading.   Final Clinical Impression(s) / ED Diagnoses Final diagnoses:  Generalized abdominal pain  Diarrhea, unspecified type  Nausea  Fever, unspecified fever cause  Myalgia  At  high risk for tick borne illness    Rx / DC Orders ED Discharge  Orders         Ordered    doxycycline (VIBRAMYCIN) 100 MG capsule  2 times daily        08/01/20 1446    dicyclomine (BENTYL) 20 MG tablet  2 times daily        08/01/20 1454    ondansetron (ZOFRAN ODT) 4 MG disintegrating tablet        08/01/20 1454           Jacqlyn Larsen, [place] 08/03/20 Lyles, [place], DO 08/03/20 1821

## 2020-08-01 NOTE — ED Triage Notes (Signed)
Emergency Medicine Provider Triage Evaluation Note  Blake [name] , a 40 y.o. male  was evaluated in triage.  Pt complains of abd pain.  He reported having abdominal pain and headache nausea and diarrhea as well as a rash for the past 4 days.  Went hiking 5 days ago and discovered a tick on L foot the next day.  Review of Systems  Positive: Headache, fever, abd cramping, nausea, diarrhea, rash Negative: Cp, sob, dysuria  Physical Exam  BP (!) 160/103 (BP Location: Right Arm)   Pulse 69   Temp (!) 97.5 F (36.4 C)   Resp 14   SpO2 100%  Gen:   Awake, no distress   HEENT:  Atraumatic , no nuchal rigidity Resp:  Normal effort  Cardiac:  Normal rate  Abd:   Nondistended, nontender  MSK:   Moves extremities without difficulty, rash to dorsum of feet bilaterally Neuro:  Speech clear   Medical Decision Making  Medically screening exam initiated at 9:28 AM.  Appropriate orders placed.  Blake [name] was informed that the remainder of the evaluation will be completed by another provider, this initial triage assessment does not replace that evaluation, and the importance of remaining in the ED until their evaluation is complete.  Clinical Impression  Potential tick born illness   Fayrene Helper, Cordelia Poche 08/01/20 3612

## 2020-08-01 NOTE — ED Notes (Signed)
Pt tolerated PO challenge

## 2020-08-01 NOTE — ED Triage Notes (Signed)
Pt reports he is here today due to abd pain , N&V. Pt reports he saw a tick on his foot while he was in the shower. Pt reports fevers at home.

## 2020-08-02 ENCOUNTER — Telehealth: Payer: Self-pay

## 2020-08-02 LAB — EHRLICHIA ANTIBODY PANEL
E chaffeensis (HGE) Ab, IgG: NEGATIVE
E chaffeensis (HGE) Ab, IgM: NEGATIVE
E. Chaffeensis (HME) IgM Titer: NEGATIVE
E.Chaffeensis (HME) IgG: NEGATIVE

## 2020-08-02 NOTE — Telephone Encounter (Signed)
Transition Care Management Follow-up Telephone Call  Date of discharge and from where: 08/01/2020 from [facility]  How have you been since you were released from the hospital? Pt stated that he is feeling well and does not have any questions or concerns at this time.   Any questions or concerns? No  Items Reviewed:  Did the pt receive and understand the discharge instructions provided? Yes   Medications obtained and verified? Yes   Other? No   Any new allergies since your discharge? No   Dietary orders reviewed? DM and heart healthy  Do you have support at home? Yes   Functional Questionnaire: (I = Independent and D = Dependent) ADLs: I  Bathing/Dressing- I  Meal Prep- I  Eating- I  Maintaining continence- I  Transferring/Ambulation- I  Managing Meds- I   Follow up appointments reviewed:   PCP Hospital f/u appt confirmed? No    Specialist Hospital f/u appt confirmed? No    Are transportation arrangements needed? No   If their condition worsens, is the pt aware to call PCP or go to the Emergency Dept.? Yes  Was the patient provided with contact information for the PCP's office or ED? Yes  Was to pt encouraged to call back with questions or concerns? Yes

## 2020-08-03 ENCOUNTER — Telehealth (INDEPENDENT_AMBULATORY_CARE_PROVIDER_SITE_OTHER): Payer: PRIVATE HEALTH INSURANCE | Admitting: Family Medicine

## 2020-08-03 ENCOUNTER — Telehealth: Payer: Self-pay | Admitting: Family Medicine

## 2020-08-03 ENCOUNTER — Other Ambulatory Visit: Payer: Self-pay

## 2020-08-03 DIAGNOSIS — E781 Pure hyperglyceridemia: Secondary | ICD-10-CM

## 2020-08-03 DIAGNOSIS — E669 Obesity, unspecified: Secondary | ICD-10-CM | POA: Diagnosis not present

## 2020-08-03 DIAGNOSIS — E119 Type 2 diabetes mellitus without complications: Secondary | ICD-10-CM | POA: Diagnosis not present

## 2020-08-03 DIAGNOSIS — F439 Reaction to severe stress, unspecified: Secondary | ICD-10-CM

## 2020-08-03 DIAGNOSIS — E871 Hypo-osmolality and hyponatremia: Secondary | ICD-10-CM

## 2020-08-03 DIAGNOSIS — K760 Fatty (change of) liver, not elsewhere classified: Secondary | ICD-10-CM

## 2020-08-03 LAB — ROCKY MTN SPOTTED FVR ABS PNL(IGG+IGM)
RMSF IgG: NEGATIVE
RMSF IgM: 3.04 index — ABNORMAL HIGH (ref 0.00–0.89)

## 2020-08-03 MED ORDER — LISINOPRIL 20 MG PO TABS: 1.0000 | ORAL_TABLET | Freq: Every day | ORAL | 1 refills | Status: DC

## 2020-08-03 MED ORDER — FENOFIBRATE 160 MG PO TABS: 160.0000 mg | ORAL_TABLET | Freq: Every day | ORAL | 1 refills | Status: DC

## 2020-08-03 MED ORDER — METFORMIN HCL 500 MG PO TABS: 1000.0000 mg | ORAL_TABLET | Freq: Two times a day (BID) | ORAL | 1 refills | Status: DC

## 2020-08-03 MED ORDER — ROSUVASTATIN CALCIUM 40 MG PO TABS: ORAL_TABLET | ORAL | 1 refills | Status: DC

## 2020-08-03 MED ORDER — SERTRALINE HCL 50 MG PO TABS: 50.0000 mg | ORAL_TABLET | Freq: Every day | ORAL | 1 refills | Status: DC

## 2020-08-03 NOTE — Telephone Encounter (Signed)
Mr. Blake [name], Blake [name] are scheduled for a virtual visit with your provider today.    Just as we do with appointments in the office, we must obtain your consent to participate.  Your consent will be active for this visit and any virtual visit you may have with one of our providers in the next 365 days.    If you have a MyChart account, I can also send a copy of this consent to you electronically.  All virtual visits are billed to your insurance company just like a traditional visit in the office.  As this is a virtual visit, video technology does not allow for your provider to perform a traditional examination.  This may limit your provider's ability to fully assess your condition.  If your provider identifies any concerns that need to be evaluated in person or the need to arrange testing such as labs, EKG, etc, we will make arrangements to do so.    Although advances in technology are sophisticated, we cannot ensure that it will always work on either your end or our end.  If the connection with a video visit is poor, we may have to switch to a telephone visit.  With either a video or telephone visit, we are not always able to ensure that we have a secure connection.   I need to obtain your verbal consent now.   Are you willing to proceed with your visit today?   Blake [name] has provided verbal consent on 08/03/2020 for a virtual visit (video or telephone).   Blake [place], LPN 0/[id number]  1:19 AM

## 2020-08-03 NOTE — Progress Notes (Signed)
Subjective:    Patient ID: Blake [name], male    DOB: 1980-12-26, 40 y.o.   MRN: [id number]  HPI Pt here for follow up. Pt taking all meds all meds as directed. Trying to check blood sugars at least once per day.  Pt is currently on Doxycycline for [facility] Spotted Fever.   Fatty liver - Plan: Hemoglobin A1c, Comprehensive Metabolic Panel (CMET), Lipid Profile  Type 2 diabetes mellitus without complication, without long-term current use of insulin (HCC) - Plan: Hemoglobin A1c, Comprehensive Metabolic Panel (CMET), Lipid Profile  Hypertriglyceridemia - Plan: Hemoglobin A1c, Comprehensive Metabolic Panel (CMET), Lipid Profile  Obesity (BMI 30-39.9) - Plan: Hemoglobin A1c, Comprehensive Metabolic Panel (CMET), Lipid Profile  Stress  Hyponatremia - Plan: Hemoglobin A1c, Comprehensive Metabolic Panel (CMET), Lipid Profile  Had a good discussion with the patient regarding his diabetes he is trying to watch starches in the diet recently went on an anniversary trip states he did not get as good a job then but otherwise is trying very hard  Patient's moods overall doing fairly well on Zoloft.  Would like to continue it  He is trying to watch portions stay active and try to lose some weight  Has a history of elevated triglycerides he is trying to be mindful of his eating habits checking his and taking his medicine will do lab work in the near future  ER notes were reviewed as well as discussion with the patient regarding his symptomatology he is having some body aches headache but no fever today does have a rash where the tick bit him but also had some gastroenteritis over the weekend Virtual Visit via Video Note  I connected with [name] on 08/03/20 at  8:20 AM EDT by a video enabled telemedicine application and verified that I am speaking with the correct person using two identifiers.  Location: Patient: home Provider: office   I discussed the limitations of  evaluation and management by telemedicine and the availability of in person appointments. The patient expressed understanding and agreed to proceed.  History of Present Illness:    Observations/Objective:   Assessment and Plan:   Follow Up Instructions:    I discussed the assessment and treatment plan with the patient. The patient was provided an opportunity to ask questions and all were answered. The patient agreed with the plan and demonstrated an understanding of the instructions.   The patient was advised to call back or seek an in-person evaluation if the symptoms worsen or if the condition fails to improve as anticipated.  I provided includes 25 minutes of non-face-to-face time both with the patient as well as documentation during this encounter.  Patient did have a tick that he pulled off with a localized rash did have fever and body aches over the weekend along with diarrhea hard to know if this was a viral diarrhea or possibly tick related illness His lab work did show slight elevation of liver enzymes and slight hyponatremia but he is doing a good job of taking his doxycycline which should cover for tick related illness we will repeat lab work in [redacted] weeks along with standard labs    Review of Systems     Objective:   Physical Exam Patient had virtual visit-video Appears to be in no distress Atraumatic Neuro able to relate and oriented No apparent resp distress Color normal        Assessment & Plan:  1. Fatty liver Patient had recent illness.  And  liver enzymes slightly up.  Hard to know if this was due to illness or due to fatty liver Healthy diet regular activity recommended repeat lab work in approximately 4 weeks  2. Type 2 diabetes mellitus without complication, without long-term current use of insulin (HCC) Patient watching starches in the diet trying to stay physically active taking his medications except for over the weekend when he was sick otherwise  doing well with this.  We will check A1c in 4 weeks patient will do a follow-up visit in the fall for wellness and recheck on his health issues  3. Hypertriglyceridemia Avoid fried foods fatty foods continue medication  4. Obesity (BMI 30-39.9) Continue to work on portions healthy eating and regular activity  5. Stress Doing much better at work.  Tolerating sertraline well.  Follow-up by fall

## 2020-08-04 LAB — LYME DISEASE DNA BY PCR(BORRELIA BURG): Lyme Disease(B.burgdorferi)PCR: NEGATIVE

## 2020-11-04 LAB — HEMOGLOBIN A1C
Est. average glucose Bld gHb Est-mCnc: 229 mg/dL
Hgb A1c MFr Bld: 9.6 % — ABNORMAL HIGH (ref 4.8–5.6)

## 2020-11-04 LAB — LIPID PANEL
Chol/HDL Ratio: 8.7 ratio — ABNORMAL HIGH (ref 0.0–5.0)
Cholesterol, Total: 201 mg/dL — ABNORMAL HIGH (ref 100–199)
HDL: 23 mg/dL — ABNORMAL LOW (ref >39)
Triglycerides: 1169 mg/dL (ref 0–149)

## 2020-11-04 LAB — COMPREHENSIVE METABOLIC PANEL
ALT: 39 IU/L (ref 0–44)
AST: 31 IU/L (ref 0–40)
Albumin/Globulin Ratio: 2.2 (ref 1.2–2.2)
Albumin: 4.7 g/dL (ref 4.0–5.0)
Alkaline Phosphatase: 60 IU/L (ref 44–121)
BUN/Creatinine Ratio: 13 (ref 9–20)
BUN: 11 mg/dL (ref 6–20)
Bilirubin Total: 0.3 mg/dL (ref 0.0–1.2)
CO2: 22 mmol/L (ref 20–29)
Calcium: 9.1 mg/dL (ref 8.7–10.2)
Chloride: 99 mmol/L (ref 96–106)
Creatinine, Ser: 0.83 mg/dL (ref 0.76–1.27)
Globulin, Total: 2.1 g/dL (ref 1.5–4.5)
Glucose: 251 mg/dL — ABNORMAL HIGH (ref 65–99)
Potassium: 4.7 mmol/L (ref 3.5–5.2)
Sodium: 137 mmol/L (ref 134–144)
Total Protein: 6.8 g/dL (ref 6.0–8.5)
eGFR: 116 mL/min/{1.73_m2} (ref >59)

## 2020-11-28 ENCOUNTER — Ambulatory Visit (INDEPENDENT_AMBULATORY_CARE_PROVIDER_SITE_OTHER): Payer: No Typology Code available for payment source | Admitting: Family Medicine

## 2020-11-28 ENCOUNTER — Other Ambulatory Visit: Payer: Self-pay

## 2020-11-28 VITALS — BP 137/85 | HR 67 | Temp 98.2°F | Ht 71.0 in | Wt 251.0 lb

## 2020-11-28 DIAGNOSIS — F439 Reaction to severe stress, unspecified: Secondary | ICD-10-CM

## 2020-11-28 DIAGNOSIS — G4733 Obstructive sleep apnea (adult) (pediatric): Secondary | ICD-10-CM | POA: Diagnosis not present

## 2020-11-28 DIAGNOSIS — E781 Pure hyperglyceridemia: Secondary | ICD-10-CM

## 2020-11-28 DIAGNOSIS — K760 Fatty (change of) liver, not elsewhere classified: Secondary | ICD-10-CM | POA: Diagnosis not present

## 2020-11-28 DIAGNOSIS — F320 Major depressive disorder, single episode, mild: Secondary | ICD-10-CM

## 2020-11-28 DIAGNOSIS — E119 Type 2 diabetes mellitus without complications: Secondary | ICD-10-CM

## 2020-11-28 MED ORDER — EMPAGLIFLOZIN 10 MG PO TABS
10.0000 mg | ORAL_TABLET | Freq: Every day | ORAL | 5 refills | Status: DC
Start: 2020-11-28 — End: 2021-01-10

## 2020-11-28 MED ORDER — SERTRALINE HCL 100 MG PO TABS: 100.0000 mg | ORAL_TABLET | Freq: Every day | ORAL | 5 refills | Status: DC

## 2020-11-28 MED ORDER — OMEGA-3-ACID ETHYL ESTERS 1 G PO CAPS: 2.0000 g | ORAL_CAPSULE | Freq: Two times a day (BID) | ORAL | 5 refills | Status: DC

## 2020-11-28 NOTE — Progress Notes (Signed)
Subjective:    Patient ID: Blake [name], male    DOB: [birth year], 40 y.o.   MRN: [id number]  Depression      Anxiety  Sertraline not helping depression / anxiety Review labs and sleep study results Results for orders placed or performed in visit on 08/03/20  Hemoglobin A1c  Result Value Ref Range   Hgb A1c MFr Bld 9.6 (H) 4.8 - 5.6 %   Est. average glucose Bld gHb Est-mCnc 229 mg/dL  Comprehensive Metabolic Panel (CMET)  Result Value Ref Range   Glucose 251 (H) 65 - 99 mg/dL   BUN 11 6 - 20 mg/dL   Creatinine, Ser 0.83 0.76 - 1.27 mg/dL   eGFR 116 >59 mL/min/1.73   BUN/Creatinine Ratio 13 9 - 20   Sodium 137 134 - 144 mmol/L   Potassium 4.7 3.5 - 5.2 mmol/L   Chloride 99 96 - 106 mmol/L   CO2 22 20 - 29 mmol/L   Calcium 9.1 8.7 - 10.2 mg/dL   Total Protein 6.8 6.0 - 8.5 g/dL   Albumin 4.7 4.0 - 5.0 g/dL   Globulin, Total 2.1 1.5 - 4.5 g/dL   Albumin/Globulin Ratio 2.2 1.2 - 2.2   Bilirubin Total 0.3 0.0 - 1.2 mg/dL   Alkaline Phosphatase 60 44 - 121 IU/L   AST 31 0 - 40 IU/L   ALT 39 0 - 44 IU/L  Lipid Profile  Result Value Ref Range   Cholesterol, Total 201 (H) 100 - 199 mg/dL   Triglycerides 1,169 (HH) 0 - 149 mg/dL   HDL 23 (L) >39 mg/dL   VLDL Cholesterol Cal Comment (A) 5 - 40 mg/dL   LDL Chol Calc (NIH) Comment (A) 0 - 99 mg/dL   Chol/HDL Ratio 8.7 (H) 0.0 - 5.0 ratio   Type 2 diabetes mellitus without complication, without long-term current use of insulin (HCC) - Plan: Basic metabolic panel, Lipid panel, Hemoglobin O1I, Basic metabolic panel  Hypertriglyceridemia - Plan: Basic metabolic panel, Lipid panel, Hemoglobin D0V, Basic metabolic panel  Fatty liver - Plan: Basic metabolic panel, Lipid panel, Hemoglobin U1T, Basic metabolic panel  Obstructive sleep apnea syndrome - Plan: Basic metabolic panel, Nocturnal polysomnography (NPSG), Ambulatory referral to Psychology  Stress - Plan: Nocturnal polysomnography (NPSG), Ambulatory referral to  Psychology  Depression, major, single episode, mild ([place]) - Plan: Nocturnal polysomnography (NPSG), Ambulatory referral to Psychology  His A1c is not under good control he is not watching his diet he is also drinking a fair amount of alcohol we discussed all these parameters he states he is motivated to stay away from alcohol and do better with his diet we also discussed various choices will need toward Jardiance to help get his A1c under better control  His triglycerides are way too high consistent with his diet and poorly controlled diabetes he must get away from alcohol minimize starches get the A1c under better control also we will go ahead would look positive to take the place of fenofibrate  Has sleep apnea has a old CPAP machine but states it is really not working at all he would like to get a new machine therefore he needs to have a new study we will move forward with that  Significant stress in the household his wife has health issues his father-in-law has dementia and his kids are nice kids but very stressing  therefore he is interested in doing some counseling we we will help set this up  Denies being suicidal but states sertraline  is not helping him he is interested in trying a little stronger dose   Review of Systems  Psychiatric/Behavioral:  Positive for depression.       Objective:   Physical Exam General-in no acute distress Eyes-no discharge Lungs-respiratory rate normal, CTA CV-no murmurs,RRR Extremities skin warm dry no edema Neuro grossly normal Behavior normal, alert        Assessment & Plan:  1. Type 2 diabetes mellitus without complication, without long-term current use of insulin (HCC) Poorly controlled healthy diet recommended start Jardiance follow-up A1c in 3 months Metabolic 7 within 3 to 4 weeks If problems or health issues notify us - Basic metabolic panel - Lipid panel - Hemoglobin Z0S - Basic metabolic panel  2. Hypertriglyceridemia Stop  fenofibrate.  Recommend starting Lovaza.  Recheck labs again in 3 months time.  If any problems side effects notify us - Basic metabolic panel - Lipid panel - Hemoglobin P2Z - Basic metabolic panel  3. Fatty liver Eliminate alcohol.  Alcohol is increasing his risk of serious health issues.  Patient states he can stay away on its own. - Basic metabolic panel - Lipid panel - Hemoglobin R0Q - Basic metabolic panel  4. Obstructive sleep apnea syndrome History of sleep apnea.  Needs a new sleep study.  Patient would benefit from further evaluation of this. - Basic metabolic panel - Nocturnal polysomnography (NPSG) - Ambulatory referral to Psychology  5. Stress Counseling recommended.  Move forward with this. - Nocturnal polysomnography (NPSG) - Ambulatory referral to Psychology  6. Depression, major, single episode, mild (HCC) Sertraline increased 100 mg daily send [place] an update within 3 weeks how this is doing patient not suicidal patient to follow-up by November - Nocturnal polysomnography (NPSG) - Ambulatory referral to Psychology

## 2020-11-28 NOTE — Patient Instructions (Signed)
Jardiance-we will start 10 mg 1 each morning.  More than likely this will make you urinate more.  Please do the best he can at eliminating alcohol and also minimizing starches and sugars.  Triglycerides-I sent in the prescription for Lovaza-hopefully the price is affordable.  If it is this works the place of fenofibrate.  2 capsules twice daily.  Please send an update in several weeks how well you are tolerating this.  Zoloft-we will increase the dose to 100 mg daily hopefully you will see improvement within the next 3 weeks please send [place] an update  Sleep study-we are setting up referral to Dr. Frances Furbish for further evaluation and sleep study  Counseling-we are sending referral to psychology they should connect with you to help set up for some counseling  Labs 3 weeks-please do a metabolic 7 in 3 weeks  Start of November please do the other complete labs at the start in November Please set up follow-up office visit in September   Send me update in approximately 3 weeks sooner if problems thanks-Dr. Lorin Picket

## 2020-12-21 ENCOUNTER — Other Ambulatory Visit: Payer: Self-pay | Admitting: Family Medicine

## 2020-12-28 ENCOUNTER — Encounter: Payer: Self-pay | Admitting: Family Medicine

## 2020-12-28 DIAGNOSIS — F320 Major depressive disorder, single episode, mild: Secondary | ICD-10-CM

## 2021-01-03 NOTE — Addendum Note (Signed)
Addended by: Marlowe Shores on: 01/03/2021 04:42 PM   Modules accepted: Orders

## 2021-01-03 NOTE — Telephone Encounter (Signed)
Nurses-please go ahead with referral as requested

## 2021-01-05 ENCOUNTER — Other Ambulatory Visit: Payer: Self-pay | Admitting: Family Medicine

## 2021-01-08 ENCOUNTER — Other Ambulatory Visit: Payer: Self-pay | Admitting: Family Medicine

## 2021-01-10 ENCOUNTER — Telehealth: Payer: Self-pay | Admitting: *Deleted

## 2021-01-10 MED ORDER — EMPAGLIFLOZIN 10 MG PO TABS: 10.0000 mg | ORAL_TABLET | Freq: Every day | ORAL | 1 refills | Status: DC

## 2021-01-10 NOTE — Telephone Encounter (Signed)
Fax from [facility] requesting Rx for Jardiance as 90 day supply- Approved by Dr Lorin Picket and script sent electronically to pharmacy.

## 2021-01-23 ENCOUNTER — Other Ambulatory Visit (HOSPITAL_COMMUNITY): Payer: Self-pay

## 2021-01-23 MED ORDER — INFLUENZA VAC SPLIT QUAD 0.5 ML IM SUSY
PREFILLED_SYRINGE | INTRAMUSCULAR | 0 refills | Status: DC
  Filled 2021-01-23: qty 0.5, 1d supply, fill #0

## 2021-01-24 LAB — COMPREHENSIVE METABOLIC PANEL
ALT: 56 IU/L — ABNORMAL HIGH (ref 0–44)
AST: 39 IU/L (ref 0–40)
Albumin/Globulin Ratio: 2.4 — ABNORMAL HIGH (ref 1.2–2.2)
Albumin: 4.8 g/dL (ref 4.0–5.0)
Alkaline Phosphatase: 55 IU/L (ref 44–121)
BUN/Creatinine Ratio: 14 (ref 9–20)
BUN: 11 mg/dL (ref 6–20)
Bilirubin Total: 0.4 mg/dL (ref 0.0–1.2)
CO2: 24 mmol/L (ref 20–29)
Calcium: 9.8 mg/dL (ref 8.7–10.2)
Chloride: 101 mmol/L (ref 96–106)
Creatinine, Ser: 0.79 mg/dL (ref 0.76–1.27)
Globulin, Total: 2 g/dL (ref 1.5–4.5)
Glucose: 143 mg/dL — ABNORMAL HIGH (ref 70–99)
Potassium: 4.8 mmol/L (ref 3.5–5.2)
Sodium: 140 mmol/L (ref 134–144)
Total Protein: 6.8 g/dL (ref 6.0–8.5)
eGFR: 117 mL/min/{1.73_m2} (ref >59)

## 2021-01-24 LAB — LIPID PANEL
Chol/HDL Ratio: 6 ratio — ABNORMAL HIGH (ref 0.0–5.0)
Cholesterol, Total: 181 mg/dL (ref 100–199)
HDL: 30 mg/dL — ABNORMAL LOW (ref >39)
Triglycerides: 865 mg/dL (ref 0–149)

## 2021-01-24 LAB — HEMOGLOBIN A1C
Est. average glucose Bld gHb Est-mCnc: 209 mg/dL
Hgb A1c MFr Bld: 8.9 % — ABNORMAL HIGH (ref 4.8–5.6)

## 2021-01-29 ENCOUNTER — Encounter: Payer: Self-pay | Admitting: Family Medicine

## 2021-01-29 DIAGNOSIS — Z79899 Other long term (current) drug therapy: Secondary | ICD-10-CM

## 2021-01-29 DIAGNOSIS — F1090 Alcohol use, unspecified, uncomplicated: Secondary | ICD-10-CM

## 2021-01-29 DIAGNOSIS — Z789 Other specified health status: Secondary | ICD-10-CM

## 2021-01-30 NOTE — Telephone Encounter (Signed)
Nurses May start naltrexone 50 mg 1 daily #30 2 refills Repeat liver profile in 4 weeks Please have front move up his follow-up visit depending on availability of office visits

## 2021-01-31 MED ORDER — NALTREXONE HCL 50 MG PO TABS: ORAL_TABLET | ORAL | 2 refills | Status: DC

## 2021-02-21 ENCOUNTER — Encounter: Payer: Self-pay | Admitting: Family Medicine

## 2021-02-21 ENCOUNTER — Other Ambulatory Visit: Payer: Self-pay

## 2021-02-21 ENCOUNTER — Ambulatory Visit (INDEPENDENT_AMBULATORY_CARE_PROVIDER_SITE_OTHER): Payer: No Typology Code available for payment source | Admitting: Family Medicine

## 2021-02-21 VITALS — BP 128/70 | HR 78 | Temp 98.0°F | Ht 71.0 in | Wt 249.0 lb

## 2021-02-21 DIAGNOSIS — K429 Umbilical hernia without obstruction or gangrene: Secondary | ICD-10-CM | POA: Diagnosis not present

## 2021-02-21 DIAGNOSIS — E669 Obesity, unspecified: Secondary | ICD-10-CM

## 2021-02-21 DIAGNOSIS — G4733 Obstructive sleep apnea (adult) (pediatric): Secondary | ICD-10-CM

## 2021-02-21 DIAGNOSIS — E119 Type 2 diabetes mellitus without complications: Secondary | ICD-10-CM

## 2021-02-21 DIAGNOSIS — K760 Fatty (change of) liver, not elsewhere classified: Secondary | ICD-10-CM

## 2021-02-21 MED ORDER — ROSUVASTATIN CALCIUM 40 MG PO TABS: 40.0000 mg | ORAL_TABLET | Freq: Every day | ORAL | 1 refills | Status: DC

## 2021-02-21 MED ORDER — EMPAGLIFLOZIN 25 MG PO TABS: 25.0000 mg | ORAL_TABLET | Freq: Every day | ORAL | 1 refills | Status: DC

## 2021-02-21 MED ORDER — METFORMIN HCL 500 MG PO TABS: 1000.0000 mg | ORAL_TABLET | Freq: Two times a day (BID) | ORAL | 1 refills | Status: DC

## 2021-02-21 NOTE — Progress Notes (Signed)
   Subjective:    Patient ID: Blake [name], male    DOB: [birth year], 40 y.o.   MRN: [id number]  HPI  3 MONTH FOLLOW UP   Dm , HTN, Hypertriglycerides  Review of Systems     Objective:   Physical Exam        Assessment & Plan:

## 2021-02-21 NOTE — Progress Notes (Signed)
   Subjective:    Patient ID: Blake [name], male    DOB: 09/08/1980, 40 y.o.   MRN: [id number]  HPI Patient presents today for follow-up Has some health underlying problems that are currently being managed Obesity-he is trying to work on his weight by trying to do better with healthy eating.  And staying physically active Alcohol use-patient states that he is currently using the naltrexone and it causes him not to want to drink any alcohol so currently he has successful Smoking-he admits that he still smokes but he states he is aiming to stop Hypertriglyceridemia utilizing his medicine states that eating habits could be better we will work hard on this Diabetes-subpar control subpar dietary patient not a good candidate for GLP we talked about healthy eating regular physical activity and adjusting medications We did review his lab work   Review of Systems     Objective:   Physical Exam General-in no acute distress Eyes-no discharge Lungs-respiratory rate normal, CTA CV-no murmurs,RRR Extremities skin warm dry no edema Neuro grossly normal Behavior normal, alert Abdomen soft Patient does have a mild ventral hernia Also has an umbilical hernia       Assessment & Plan:  1. Umbilical hernia without obstruction and without gangrene Small umbilical hernia noted for now we will watch this rationale was discussed if he gets worse referral to general surgery  2. Type 2 diabetes mellitus without complication, without long-term current use of insulin (HCC) Subpar control bump up dose of Jardiance.  Patient to send [place] some readings next week if not dramatically better add glipizide extended release Down the road may end up needing to be on insulin Not a good GLP-1 candidate due to severe triglyceride elevation  3. Obstructive sleep apnea Patient with significant fatigue tiredness sleepiness referral for sleep studies this was done previously but patient states he never heard  anything from them - Ambulatory referral to Sleep Studies  4. Fatty liver 1 liver enzyme slightly elevated but not severe healthy diet regular activity portion control losing weight  5. Obesity (BMI 30.0-34.9) Portion control exercise losing weight  Continue naltrexone to help minimize drinking.  Try to quit smoking.  Wellness exam within the next 30 days Regular follow-up for other health issues by early spring

## 2021-02-22 ENCOUNTER — Encounter: Payer: Self-pay | Admitting: Family Medicine

## 2021-02-22 DIAGNOSIS — F320 Major depressive disorder, single episode, mild: Secondary | ICD-10-CM

## 2021-02-28 ENCOUNTER — Encounter: Payer: No Typology Code available for payment source | Admitting: Family Medicine

## 2021-02-28 ENCOUNTER — Ambulatory Visit: Payer: No Typology Code available for payment source | Admitting: Family Medicine

## 2021-03-23 ENCOUNTER — Other Ambulatory Visit: Payer: Self-pay

## 2021-03-23 ENCOUNTER — Other Ambulatory Visit: Payer: Self-pay | Admitting: *Deleted

## 2021-03-23 ENCOUNTER — Ambulatory Visit (INDEPENDENT_AMBULATORY_CARE_PROVIDER_SITE_OTHER): Payer: No Typology Code available for payment source | Admitting: Family Medicine

## 2021-03-23 VITALS — BP 124/74 | HR 90 | Temp 98.1°F | Ht 71.0 in | Wt 241.4 lb

## 2021-03-23 DIAGNOSIS — E7849 Other hyperlipidemia: Secondary | ICD-10-CM

## 2021-03-23 DIAGNOSIS — E119 Type 2 diabetes mellitus without complications: Secondary | ICD-10-CM

## 2021-03-23 DIAGNOSIS — Z0000 Encounter for general adult medical examination without abnormal findings: Secondary | ICD-10-CM | POA: Diagnosis not present

## 2021-03-23 DIAGNOSIS — K760 Fatty (change of) liver, not elsewhere classified: Secondary | ICD-10-CM

## 2021-03-23 MED ORDER — NALTREXONE HCL 50 MG PO TABS: ORAL_TABLET | ORAL | 2 refills | Status: DC

## 2021-03-23 MED ORDER — LISINOPRIL 20 MG PO TABS: 20.0000 mg | ORAL_TABLET | Freq: Every day | ORAL | 3 refills | Status: DC

## 2021-03-23 NOTE — Patient Instructions (Signed)
Labs by late Feb 2023

## 2021-03-23 NOTE — Progress Notes (Signed)
° °  Subjective:    Patient ID: Blake [name], male    DOB: [birth year], 40 y.o.   MRN: [id number]  HPI  The patient comes in today for a wellness visit. Patient doing very well staying away from alcohol He is cutting back on smoking but has not succeeded fully yet Scheduled to see psychiatry in the near future for stress and personal issues Is trying to get his diabetes under control with better diet exercise and losing weight Taking his medicines as directed   A review of their health history was completed.  A review of medications was also completed.  Any needed refills; None  Eating habits: fruits, vegetables and meats  Falls/  MVA accidents in past few months: No  Regular exercise:No  Specialist pt sees on regular basis: No  Preventative health issues were discussed.   Additional concerns: Non  Review of Systems     Objective:   Physical Exam General-in no acute distress Eyes-no discharge Lungs-respiratory rate normal, CTA CV-no murmurs,RRR Extremities skin warm dry no edema Neuro grossly normal Behavior normal, alert        Assessment & Plan:  Adult wellness-complete.wellness physical was conducted today. Importance of diet and exercise were discussed in detail.  In addition to this a discussion regarding safety was also covered. We also reviewed over immunizations and gave recommendations regarding current immunization needed for age.  In addition to this additional areas were also touched on including: Preventative health exams needed:  Colonoscopy not indicated currently  Patient was advised yearly wellness exam  He will do his comprehensive lab work in February with follow-up office visit in March Continue Narcan to stay away from alcohol Encourage patient to gradually cut back and quit smoking

## 2021-04-19 ENCOUNTER — Other Ambulatory Visit: Payer: Self-pay

## 2021-04-19 ENCOUNTER — Encounter: Payer: Self-pay | Admitting: Behavioral Health

## 2021-04-19 ENCOUNTER — Ambulatory Visit (INDEPENDENT_AMBULATORY_CARE_PROVIDER_SITE_OTHER): Payer: PRIVATE HEALTH INSURANCE | Admitting: Behavioral Health

## 2021-04-19 VITALS — BP 154/90 | HR 95 | Ht 69.0 in | Wt 245.0 lb

## 2021-04-19 DIAGNOSIS — F39 Unspecified mood [affective] disorder: Secondary | ICD-10-CM | POA: Diagnosis not present

## 2021-04-19 DIAGNOSIS — F17200 Nicotine dependence, unspecified, uncomplicated: Secondary | ICD-10-CM

## 2021-04-19 DIAGNOSIS — F1010 Alcohol abuse, uncomplicated: Secondary | ICD-10-CM

## 2021-04-19 MED ORDER — LAMOTRIGINE 25 MG PO TABS: ORAL_TABLET | ORAL | 1 refills | Status: DC

## 2021-04-19 NOTE — Progress Notes (Signed)
[facility] MD/PA/NP Initial Note  04/19/2021 4:10 PM Blake [name]  MRN:  [id number]  Chief Complaint:  Chief Complaint   Patient Education; Establish Care; Drug / Alcohol Assessment; Alcohol Problem; Family Problem     HPI:  41 year old male presents to this office for initial visit and to establish care. He says that, " I have a very addictive personality, when I'm not smoking or drinking, I have mood problems. He says that  he has problems with losing his temper with his kids or just getting agitated easily. He is currently on Naltrexone from hs PCP in which he says has significantly help his cravings. He has cut back to several drinks per week. Says he would like to quit smoking again also but he gets really agitated when he cannot have a cigarette. He tried Chantix in the past and the longest he went without smoking was 2 years. He says that his moods are inconsistent but he is not depressed, and does not have anxiety often. He endorses racing thoughts, trouble concentrating, increased energy at times, and has experienced risky or foolish behaviors in the past. He is requesting a medication to help with his moods. His anxiety today  is 2/10 and depression is 0/10. He sleeps about 5 hours per night and says that he still functions at high level at work and does not miss sleep. He will occasionally nap during the day. He does endorse sleep apnea and wears C-Pap at while sleeping. He denies mania, no psychosis, no SI/HI.  Prior psychiatric medication list:  Wellbutrin Zoloft Prozac Chantix    Visit Diagnosis:    ICD-10-CM   1. Smoking addiction  F17.200     2. Unspecified mood (affective) disorder (HCC)  F39     3. Alcohol use disorder, mild, abuse  F10.10       Past Psychiatric History: Smoking addiction, Alcohol Use Disorder.   Past Medical History:  Past Medical History:  Diagnosis Date   Depression    Elevated ferritin 06/25/2019   High blood cholesterol level     Sleep apnea 01/07/13   cpap   Tobacco abuse 11/19/2018   Patient working very hard at quitting   Type 2 diabetes mellitus ([place]) 11/19/2018    Past Surgical History:  Procedure Laterality Date   ABDOMINAL SURGERY     APPENDECTOMY     CHOLECYSTECTOMY      Family Psychiatric History: see chart  Family History:  Family History  Problem Relation Age of Onset   Schizophrenia Father    Alcohol abuse Father    Diabetes Father    Alcohol abuse Paternal Uncle    Alcohol abuse Maternal Grandfather     Social History:  Social History   Socioeconomic History   Marital status: Married    Spouse name: Not on file   Number of children: Not on file   Years of education: Not on file   Highest education level: Not on file  Occupational History   Not on file  Tobacco Use   Smoking status: Former    Packs/day: 0.50    Years: 15.00    Pack years: 7.50    Types: Cigarettes    Quit date: 03/01/2014    Years since quitting: 7.1   Smokeless tobacco: Never  Substance and Sexual Activity   Alcohol use: Yes    Alcohol/week: 0.0 standard drinks    Comment: occas   Drug use: No   Sexual activity: Yes  Other Topics  Concern   Not on file  Social History Narrative   Live with wife and two children in [place]. Like woodworking in free time.      Social Determinants of Health   Financial Resource Strain: Not on file  Food Insecurity: Not on file  Transportation Needs: Not on file  Physical Activity: Not on file  Stress: Not on file  Social Connections: Not on file    Allergies:  Allergies  Allergen Reactions   Penicillins Other (See Comments)    Unknown childhood reaction    Metabolic Disorder Labs: Lab Results  Component Value Date   HGBA1C 8.9 (H) 01/23/2021   No results found for: PROLACTIN Lab Results  Component Value Date   CHOL 181 01/23/2021   TRIG 865 (HH) 01/23/2021   HDL 30 (L) 01/23/2021   CHOLHDL 6.0 (H) 01/23/2021   VLDL 58 (H) 11/23/2013   LDLCALC  Comment (A) 01/23/2021   LDLCALC Comment (A) 11/03/2020   No results found for: TSH  Therapeutic Level Labs: No results found for: LITHIUM No results found for: VALPROATE No components found for:  CBMZ  Current Medications: Current Outpatient Medications  Medication Sig Dispense Refill   empagliflozin (JARDIANCE) 25 MG TABS tablet Take 1 tablet (25 mg total) by mouth daily before breakfast. 90 tablet 1   fenofibrate 160 MG tablet Take 1 tablet (160 mg total) by mouth daily. 90 tablet 1   glucose blood test strip Test glucose once a day DX: E11.9 50 each 5   influenza vac split quadrivalent PF (FLUARIX) 0.5 ML injection Inject into the muscle. 0.5 mL 0   lisinopril (ZESTRIL) 20 MG tablet Take 1 tablet (20 mg total) by mouth daily. 90 tablet 3   metFORMIN (GLUCOPHAGE) 500 MG tablet Take 2 tablets (1,000 mg total) by mouth 2 (two) times daily. 360 tablet 1   naltrexone (DEPADE) 50 MG tablet Take one tablet po daily 30 tablet 2   omega-3 acid ethyl esters (LOVAZA) 1 g capsule Take 2 capsules (2 g total) by mouth 2 (two) times daily. 120 capsule 5   rosuvastatin (CRESTOR) 40 MG tablet Take 1 tablet (40 mg total) by mouth daily. 90 tablet 1   No current facility-administered medications for this visit.    Medication Side Effects: none  Orders placed this visit:  No orders of the defined types were placed in this encounter.   Psychiatric Specialty Exam:  Review of Systems  Constitutional:  Positive for unexpected weight change.  Gastrointestinal:  Positive for diarrhea.  Genitourinary:  Positive for frequency and urgency.  Skin:  Positive for rash.  Neurological:  Positive for weakness.  Hematological:  Bruises/bleeds easily.   There were no vitals taken for this visit.There is no height or weight on file to calculate BMI.  General Appearance: Casual, Neat, and Well Groomed  Eye Contact:  Good  Speech:  Clear and Coherent  Volume:  Normal  Mood:  NA  Affect:  Appropriate   Thought Process:  Coherent  Orientation:  Full (Time, Place, and Person)  Thought Content: Logical   Suicidal Thoughts:  No  Homicidal Thoughts:  No  Memory:  WNL  Judgement:  Good  Insight:  Good  Psychomotor Activity:  Normal  Concentration:  Concentration: Good  Recall:  Good  Fund of Knowledge: Good  Language: Good  Assets:  Desire for Improvement  ADL's:  Intact  Cognition: WNL  Prognosis:  Good   Screenings:  CAGE-AID    Flowsheet Row  Office Visit from 04/19/2021 in [facility] Score 2      [place] Office Visit from 11/28/2020 in [facility] from 08/03/2020 in [facility] Visit from 03/22/2020 in [facility]  Total GAD-7 Score 11 3 7       PHQ2-9    [place] Visit from 04/19/2021 in [facility] Visit from 11/28/2020 in [facility] from 08/03/2020 in [facility] Visit from 03/22/2020 in [facility] Visit from 02/04/2020 in [facility]  PHQ-2 Total Score 1 2 2 2 1   PHQ-9 Total Score -- 9 10 8 6        Receiving Psychotherapy: No   Treatment Plan/Recommendations:   Greater than 50% of 60 min face to face time with patient was spent on counseling and coordination of care. We discussed his concern with addictive personality and behaviors. We did CAGE assessment. We evaluated his readiness to quit smoking and his prior attempts to quit. We reviewed his previous treatment plans and current medication list. We further discussed his self report of mood fluctuations, and frequent irritability. MDQ was positive. We agreed to; To start Lamictal 25 mg for 14 days, then 50 mg total daily until next f/u. Will report worsening symptoms promptly To follow up in 4 weeks to reassess Provided emergency contact information Monitor for any sign of rash. Please  taking Lamictal and contact office immediately rash develops. Recommend seeking urgent medical attention if rash is severe and/or spreading quickly.  Reviewed PDMP    Elwanda Brooklyn, NP

## 2021-05-15 ENCOUNTER — Encounter: Payer: Self-pay | Admitting: Neurology

## 2021-05-15 ENCOUNTER — Ambulatory Visit (INDEPENDENT_AMBULATORY_CARE_PROVIDER_SITE_OTHER): Payer: PRIVATE HEALTH INSURANCE | Admitting: Neurology

## 2021-05-15 ENCOUNTER — Other Ambulatory Visit: Payer: Self-pay

## 2021-05-15 VITALS — BP 125/71 | HR 81 | Ht 71.0 in | Wt 240.0 lb

## 2021-05-15 DIAGNOSIS — E669 Obesity, unspecified: Secondary | ICD-10-CM | POA: Diagnosis not present

## 2021-05-15 DIAGNOSIS — R0683 Snoring: Secondary | ICD-10-CM

## 2021-05-15 DIAGNOSIS — G4719 Other hypersomnia: Secondary | ICD-10-CM

## 2021-05-15 DIAGNOSIS — R634 Abnormal weight loss: Secondary | ICD-10-CM | POA: Diagnosis not present

## 2021-05-15 DIAGNOSIS — Z9989 Dependence on other enabling machines and devices: Secondary | ICD-10-CM

## 2021-05-15 DIAGNOSIS — G4733 Obstructive sleep apnea (adult) (pediatric): Secondary | ICD-10-CM

## 2021-05-15 NOTE — Patient Instructions (Addendum)
Thank you for choosing [facility] for your sleep related care! It was nice to meet you both today!  Here is what we discussed today and what we came up with as our plan for you:    Based on your symptoms and your exam I believe you are still at risk for obstructive sleep apnea and would benefit from re-valuation as it has been many years and you need new supplies and updated machine. Therefore, I think we should proceed with a sleep study to determine how severe your sleep apnea is.  I will order a home sleep test.  If you have more than mild OSA, I want you to consider ongoing treatment with CPAP. Please remember, the risks and ramifications of moderate to severe obstructive sleep apnea or OSA are: Cardiovascular disease, including congestive heart failure, stroke, difficult to control hypertension, arrhythmias, and even type 2 diabetes has been linked to untreated OSA. Sleep apnea causes disruption of sleep and sleep deprivation in most cases, which, in turn, can cause recurrent headaches, problems with memory, mood, concentration, focus, and vigilance. Most people with untreated sleep apnea report excessive daytime sleepiness, which can affect their ability to drive. Please do not drive if you feel sleepy.   I will likely see you back after your sleep study to go over the test results and where to go from there. We will call you after your sleep study to advise about the results (most likely, you will hear from [place], my nurse) and to set up an appointment at the time, as necessary.    Our sleep lab administrative assistant will call you to schedule your sleep study. If you don't hear back from her by about 2 weeks from now, please feel free to call her at [phone]. You can leave a message with your phone number and concerns, if you get the voicemail box. She will call back as soon as possible.

## 2021-05-15 NOTE — Progress Notes (Signed)
Subjective:    Patient ID: Blake [name] is a 41 y.o. male.  HPI    Blake FoleySaima Kalinda Romaniello, MD, PhD [facility] [address], [address] [address] [place], [place] [address]  Dear Dr. Gerda DissLuking,   I saw your patient, Blake [name], upon your kind request in the sleep clinic today for initial consultation of his sleep disorder, in particular, evaluation of his prior diagnosis of obstructive sleep apnea.  The patient is accompanied by his wife today.  As you know, Blake [name] is a 41 year old right-handed gentleman with an underlying medical history of diabetes, fatty liver, smoking, hyperlipidemia, and obesity, who was previously diagnosed with obstructive sleep apnea.  He has been on CPAP therapy for years, over 10 as per his recollection.  I reviewed your office note from 02/21/2021.  I was able to review his sleep study from 12/14/2012.  He had a split-[facility], study was interpreted by Dr. Beryle BeamsKofi [place]on[place]ah.  He is baseline AHI was 26/h with O2 nadir of 88%.  He was titrated on CPAP at 4 cm to 7 cm.  A CPAP treatment pressure of 6 cm was recommended at that time.  He reports having had CPAP before 2014.  He has been on CPAP therapy with a [employer] S9 Escape machine.  He brought in his machine and I was able to review his compliance data for the past 30 and 90 days.  He has been compliant with treatment, average usage in the past month of 6 hours and 27 minutes, pressure at 10 cm, set pressure is at 9.8 cm without EPR.  Residual AHI information and leak information not available on this report.  His wife reports residual snoring and air leakage sounds.  He uses a large F10 fullface mask from [employer].  He works in [employer] for the [place].  He goes to bed around 1130 or 1 AM and rise time is between 7 and 7:30 AM.  He lives with his wife and 2 children, they have several pets in the household and chickens outside.  He has been working on weight  loss.  He has been working on alcohol cessation and smoking cessation.  He currently smokes about half a pack per day.  He is not aware of any family history of sleep apnea.  He denies night denied nocturia or recurrent morning headaches.  Over the years he has been consistent with his CPAP therapy and compliant with treatment.  Prior DME was [facility] but lately he has been buying supplies online.  Sometimes his headgear feels very tight and comes into the flesh at the base of the head.  His Past Medical History Is Significant For: Past Medical History:  Diagnosis Date   Depression    Elevated ferritin 06/25/2019   High blood cholesterol level    Sleep apnea 01/07/13   cpap   Tobacco abuse 11/19/2018   Patient working very hard at quitting   Type 2 diabetes mellitus (HCC) 11/19/2018    His Past Surgical History Is Significant For: Past Surgical History:  Procedure Laterality Date   ABDOMINAL SURGERY     APPENDECTOMY     CHOLECYSTECTOMY      His Family History Is Significant For: Family History  Problem Relation Age of Onset   Schizophrenia Father    Alcohol abuse Father    Diabetes Father    Alcohol abuse Maternal Grandfather    Alcohol abuse Paternal Uncle  Sleep apnea Neg Hx     His Social History Is Significant For: Social History   Socioeconomic History   Marital status: Married    Spouse name: Not on file   Number of children: Not on file   Years of education: Not on file   Highest education level: Not on file  Occupational History   Not on file  Tobacco Use   Smoking status: Every Day    Packs/day: 0.50    Years: 15.00    Pack years: 7.50    Types: Cigarettes    Last attempt to quit: 03/01/2014    Years since quitting: 7.2   Smokeless tobacco: Never   Tobacco comments:    Started smoking again around April 2020  Vaping Use   Vaping Use: Former  Substance and Sexual Activity   Alcohol use: Yes    Alcohol/week: 0.0 standard drinks    Comment: occas   Drug  use: No   Sexual activity: Yes  Other Topics Concern   Not on file  Social History Narrative   Live with wife and two children in [place]. Like woodworking in free time.      Right handed   Caffeine: 1 cup/day   Social Determinants of Health   Financial Resource Strain: Not on file  Food Insecurity: Not on file  Transportation Needs: Not on file  Physical Activity: Not on file  Stress: Not on file  Social Connections: Not on file    His Allergies Are:  Allergies  Allergen Reactions   Penicillins Other (See Comments)    Unknown childhood reaction  :   His Current Medications Are:  Outpatient Encounter Medications as of 05/15/2021  Medication Sig   empagliflozin (JARDIANCE) 25 MG TABS tablet Take 1 tablet (25 mg total) by mouth daily before breakfast.   fenofibrate 160 MG tablet Take 1 tablet (160 mg total) by mouth daily.   glucose blood test strip Test glucose once a day DX: E11.9   influenza vac split quadrivalent PF (FLUARIX) 0.5 ML injection Inject into the muscle.   lamoTRIgine (LAMICTAL) 25 MG tablet Take one tablet 25 mg for 14 days, then take two tablet 50 mg total daily.   lisinopril (ZESTRIL) 20 MG tablet Take 1 tablet (20 mg total) by mouth daily.   metFORMIN (GLUCOPHAGE) 500 MG tablet Take 2 tablets (1,000 mg total) by mouth 2 (two) times daily.   naltrexone (DEPADE) 50 MG tablet Take one tablet po daily   omega-3 acid ethyl esters (LOVAZA) 1 g capsule Take 2 capsules (2 g total) by mouth 2 (two) times daily.   rosuvastatin (CRESTOR) 40 MG tablet Take 1 tablet (40 mg total) by mouth daily.   No facility-administered encounter medications on file as of 05/15/2021.  :   Review of Systems:  Out of a complete 14 point review of systems, all are reviewed and negative with the exception of these symptoms as listed below:  Review of Systems  Neurological:        Patient is here for sleep consult. He uses a CPAP. Thinks he may have used [employer] in the past. Has  had it since at least 12/29/2012. States he has had it longer. ESS 11, FSS 35.    Objective:  Neurological Exam  Physical Exam Physical Examination:   Vitals:   05/15/21 1353  BP: 125/71  Pulse: 81    General Examination: The patient is a very pleasant 41 y.o. male in no acute distress. He  appears well-developed and well-nourished and well groomed.   HEENT: Normocephalic, atraumatic, pupils are equal, round and reactive to light, extraocular tracking is good without limitation to gaze excursion or nystagmus noted. Hearing is grossly intact. Face is symmetric with normal facial animation. Speech is clear with no dysarthria noted. There is no hypophonia. There is no lip, neck/head, jaw or voice tremor. Neck is supple with full range of passive and active motion. There are no carotid bruits on auscultation. Oropharynx exam reveals: mild mouth dryness, adequate dental hygiene and moderate airway crowding, due to small airway entry and large appearing uvula.  Tonsils on the smaller side.  Neck circumference of 18 1/4 inches.  He has a minimal overbite.  Nasal inspection reveals narrow nasal passages but no deviated septum or inferior turbinate hypertrophy.  Tongue protrudes centrally and palate elevates symmetrically.  Chest: Clear to auscultation without wheezing, rhonchi or crackles noted.  Heart: S1+S2+0, regular and normal without murmurs, rubs or gallops noted.   Abdomen: Soft, non-tender and non-distended with normal bowel sounds appreciated on auscultation.  Extremities: There is no obvious edema.    Skin: Warm and dry without trophic changes noted.   Musculoskeletal: exam reveals no obvious joint deformities.   Neurologically:  Mental status: The patient is awake, alert and oriented in all 4 spheres. His immediate and remote memory, attention, language skills and fund of knowledge are appropriate. There is no evidence of aphasia, agnosia, apraxia or anomia. Speech is clear with  normal prosody and enunciation. Thought process is linear. Mood is normal and affect is normal.  Cranial nerves II - XII are as described above under HEENT exam.  Motor exam: Normal bulk, strength and tone is noted. There is no tremor. Fine motor skills and coordination: grossly intact.  Cerebellar testing: No dysmetria or intention tremor. There is no truncal or gait ataxia.  Sensory exam: intact to light touch in the upper and lower extremities.  Gait, station and balance: He stands easily. No veering to one side is noted. No leaning to one side is noted. Posture is age-appropriate and stance is narrow based. Gait shows normal stride length and normal pace. No problems turning are noted.   Assessment and Plan:  In summary, Blake [name] is a very pleasant 41 y.o.-year old male ith an underlying medical history of diabetes, fatty liver, smoking, hyperlipidemia, and obesity, who presents for evaluation of his obstructive sleep apnea.  He has been on CPAP therapy with good success.  He has an older machine.  I had a long chat with the patient and his wife about my findings and the diagnosis of OSA, its prognosis and treatment options. We talked about medical treatments, surgical interventions and non-pharmacological approaches. I explained in particular the risks and ramifications of untreated moderate to severe OSA, especially with respect to developing cardiovascular disease down the Road, including congestive heart failure, difficult to treat hypertension, cardiac arrhythmias, or stroke. Even type 2 diabetes has, in part, been linked to untreated OSA. Symptoms of untreated OSA include daytime sleepiness, memory problems, mood irritability and mood disorder such as depression and anxiety, lack of energy, as well as recurrent headaches, especially morning headaches. We talked about the importance of smoking cessation and trying to maintain a healthy lifestyle in general, as well as the importance of  weight control. We also talked about the importance of good sleep hygiene. I recommended the following at this time: sleep study.  I outlined the differences between a laboratory attended sleep  study versus home sleep test.  We mutually agreed to pursue a home sleep test at this time.  He should be eligible for new equipment.  He may be a good candidate to establish with a DME company called [employer]. His wife had dealings with this company because of her father sleep apnea. I explained the sleep test procedure to the patient and also outlined possible surgical and non-surgical treatment options of OSA, including the use of a custom-made dental device (which would require a referral to a specialist dentist or oral surgeon), upper airway surgical options, such as traditional UPPP or a novel less invasive surgical option in the form of Inspire hypoglossal nerve stimulation (which would involve a referral to an ENT surgeon). He indicated that he would be willing to continue with PAP therapy.  I answered all their questions today and the patient and his wife were in agreement. I plan to see him back after the sleep study is completed and encouraged him to call with any interim questions, concerns, problems or updates.   Thank you very much for allowing me to participate in the care of this nice patient. If I can be of any further assistance to you please do not hesitate to call me at [phone].  Sincerely,   Blake Foley, MD, PhD

## 2021-05-17 ENCOUNTER — Ambulatory Visit (INDEPENDENT_AMBULATORY_CARE_PROVIDER_SITE_OTHER): Payer: PRIVATE HEALTH INSURANCE | Admitting: Behavioral Health

## 2021-05-17 ENCOUNTER — Other Ambulatory Visit: Payer: Self-pay

## 2021-05-17 ENCOUNTER — Encounter: Payer: Self-pay | Admitting: Behavioral Health

## 2021-05-17 DIAGNOSIS — F1010 Alcohol abuse, uncomplicated: Secondary | ICD-10-CM

## 2021-05-17 DIAGNOSIS — R454 Irritability and anger: Secondary | ICD-10-CM | POA: Diagnosis not present

## 2021-05-17 DIAGNOSIS — F17200 Nicotine dependence, unspecified, uncomplicated: Secondary | ICD-10-CM

## 2021-05-17 DIAGNOSIS — F39 Unspecified mood [affective] disorder: Secondary | ICD-10-CM

## 2021-05-17 DIAGNOSIS — F411 Generalized anxiety disorder: Secondary | ICD-10-CM

## 2021-05-17 MED ORDER — LAMOTRIGINE 100 MG PO TABS: ORAL_TABLET | ORAL | 2 refills | Status: DC

## 2021-05-17 NOTE — Progress Notes (Signed)
[facility]  Patient ID: Blake [name],  MRN: [id number]  PCP: Babs Sciara, MD  Date of Evaluation: 05/17/2021 Time spent:30 minutes  Chief Complaint:  Chief Complaint   Alcohol Problem; Irritability; Follow-up; Medication Refill     HISTORY/CURRENT STATUS: HPI  41 year old male presents to this office for follow up and medication management. He says that, "I have started to notice some changes with my irritability, and I asked my wife. She says she thinks there is a difference". Says that he would like to consider increasing medication because he is not where he wants to be yet. His anxiety today  is 2/10 and depression is 0/10. He sleeps about 5 hours per night and says that he still functions at high level at work and does not miss sleep. He will occasionally nap during the day. He does endorse sleep apnea and wears C-Pap at while sleeping. He denies mania, no psychosis, no SI/HI.   Prior psychiatric medication list:   Wellbutrin Zoloft Prozac Chantix   Individual Medical History/ Review of Systems: Changes? :No   Allergies: Penicillins  Current Medications:  Current Outpatient Medications:    lamoTRIgine (LAMICTAL) 100 MG tablet, Take one half tablet 50 mg  twice daily., Disp: 30 tablet, Rfl: 2   empagliflozin (JARDIANCE) 25 MG TABS tablet, Take 1 tablet (25 mg total) by mouth daily before breakfast., Disp: 90 tablet, Rfl: 1   fenofibrate 160 MG tablet, Take 1 tablet (160 mg total) by mouth daily., Disp: 90 tablet, Rfl: 1   glucose blood test strip, Test glucose once a day DX: E11.9, Disp: 50 each, Rfl: 5   influenza vac split quadrivalent PF (FLUARIX) 0.5 ML injection, Inject into the muscle., Disp: 0.5 mL, Rfl: 0   lamoTRIgine (LAMICTAL) 25 MG tablet, Take one tablet 25 mg for 14 days, then take two tablet 50 mg total daily., Disp: 60 tablet, Rfl: 1   lisinopril (ZESTRIL) 20 MG tablet, Take 1 tablet (20 mg total) by mouth daily., Disp: 90 tablet, Rfl:  3   metFORMIN (GLUCOPHAGE) 500 MG tablet, Take 2 tablets (1,000 mg total) by mouth 2 (two) times daily., Disp: 360 tablet, Rfl: 1   naltrexone (DEPADE) 50 MG tablet, Take one tablet po daily, Disp: 30 tablet, Rfl: 2   omega-3 acid ethyl esters (LOVAZA) 1 g capsule, Take 2 capsules (2 g total) by mouth 2 (two) times daily., Disp: 120 capsule, Rfl: 5   rosuvastatin (CRESTOR) 40 MG tablet, Take 1 tablet (40 mg total) by mouth daily., Disp: 90 tablet, Rfl: 1 Medication Side Effects: none  Family Medical/ Social History: Changes? No    MENTAL HEALTH EXAM:  There were no vitals taken for this visit.There is no height or weight on file to calculate BMI.  General Appearance: Casual and in work uniform  Eye Contact:  Good  Speech:  Clear and Coherent  Volume:  Normal  Mood:  Anxious  Affect:  Appropriate  Thought Process:  Coherent  Orientation:  Full (Time, Place, and Person)  Thought Content: Logical   Suicidal Thoughts:  No  Homicidal Thoughts:  No  Memory:  WNL  Judgement:  Good  Insight:  Good  Psychomotor Activity:  Normal  Concentration:  Concentration: Good  Recall:  Good  Fund of Knowledge: Good  Language: Good  Assets:  Desire for Improvement  ADL's:  Intact  Cognition: WNL  Prognosis:  Good    DIAGNOSES:    ICD-10-CM   1. Smoking addiction  F17.200     2. Unspecified mood (affective) disorder (HCC)  F39 lamoTRIgine (LAMICTAL) 100 MG tablet    3. Alcohol use disorder, mild, abuse  F10.10     4. Irritability  R45.4 lamoTRIgine (LAMICTAL) 100 MG tablet    5. Generalized anxiety disorder  F41.1       Receiving Psychotherapy: No    RECOMMENDATIONS:   Greater than 50% of 30 min face to face time with patient was spent on counseling and coordination of care. We discussed his recent improvement with irritability and moods. Says his wife has noticed positive change. We discussed his concern with addictive personality and behaviors. We reviewed again  his readiness to  quit smoking and his prior attempts to quit.  He requesting increase on Lamictal hoping to continue improvement with moods.  We agreed to; To increase Lamictal to 100 mg. Will take 50 mg tablet twice daily.  Will report worsening symptoms promptly To follow up in 4 weeks to reassess Provided emergency contact information Monitor for any sign of rash. Please taking Lamictal and contact office immediately rash develops. Recommend seeking urgent medical attention if rash is severe and/or spreading quickly.  Reviewed PDMP        Joan Flores, NP

## 2021-06-14 ENCOUNTER — Ambulatory Visit: Payer: PRIVATE HEALTH INSURANCE | Admitting: Behavioral Health

## 2021-06-20 LAB — BASIC METABOLIC PANEL
BUN/Creatinine Ratio: 13 (ref 9–20)
BUN: 12 mg/dL (ref 6–20)
CO2: 24 mmol/L (ref 20–29)
Calcium: 10 mg/dL (ref 8.7–10.2)
Chloride: 99 mmol/L (ref 96–106)
Creatinine, Ser: 0.89 mg/dL (ref 0.76–1.27)
Glucose: 116 mg/dL — ABNORMAL HIGH (ref 70–99)
Potassium: 4.5 mmol/L (ref 3.5–5.2)
Sodium: 140 mmol/L (ref 134–144)
eGFR: 112 mL/min/{1.73_m2} (ref >59)

## 2021-06-20 LAB — HEMOGLOBIN A1C
Est. average glucose Bld gHb Est-mCnc: 148 mg/dL
Hgb A1c MFr Bld: 6.8 % — ABNORMAL HIGH (ref 4.8–5.6)

## 2021-06-20 LAB — HEPATIC FUNCTION PANEL
ALT: 39 IU/L (ref 0–44)
AST: 28 IU/L (ref 0–40)
Albumin: 5.1 g/dL — ABNORMAL HIGH (ref 4.0–5.0)
Alkaline Phosphatase: 58 IU/L (ref 44–121)
Bilirubin Total: 0.4 mg/dL (ref 0.0–1.2)
Bilirubin, Direct: 0.12 mg/dL (ref 0.00–0.40)
Total Protein: 7.4 g/dL (ref 6.0–8.5)

## 2021-06-20 LAB — MICROALBUMIN / CREATININE URINE RATIO
Creatinine, Urine: 89.2 mg/dL
Microalb/Creat Ratio: 9 mg/g creat (ref 0–29)
Microalbumin, Urine: 7.6 ug/mL

## 2021-06-20 LAB — LIPID PANEL
Chol/HDL Ratio: 4.1 ratio (ref 0.0–5.0)
Cholesterol, Total: 136 mg/dL (ref 100–199)
HDL: 33 mg/dL — ABNORMAL LOW (ref >39)
LDL Chol Calc (NIH): 46 mg/dL (ref 0–99)
Triglycerides: 382 mg/dL — ABNORMAL HIGH (ref 0–149)
VLDL Cholesterol Cal: 57 mg/dL — ABNORMAL HIGH (ref 5–40)

## 2021-06-21 ENCOUNTER — Encounter: Payer: Self-pay | Admitting: Family Medicine

## 2021-06-21 ENCOUNTER — Ambulatory Visit (INDEPENDENT_AMBULATORY_CARE_PROVIDER_SITE_OTHER): Payer: PRIVATE HEALTH INSURANCE | Admitting: Family Medicine

## 2021-06-21 ENCOUNTER — Other Ambulatory Visit: Payer: Self-pay

## 2021-06-21 VITALS — BP 120/80 | Ht 71.0 in | Wt 235.8 lb

## 2021-06-21 DIAGNOSIS — E7849 Other hyperlipidemia: Secondary | ICD-10-CM

## 2021-06-21 DIAGNOSIS — E119 Type 2 diabetes mellitus without complications: Secondary | ICD-10-CM | POA: Diagnosis not present

## 2021-06-21 DIAGNOSIS — K760 Fatty (change of) liver, not elsewhere classified: Secondary | ICD-10-CM

## 2021-06-21 DIAGNOSIS — E669 Obesity, unspecified: Secondary | ICD-10-CM

## 2021-06-21 DIAGNOSIS — Z79899 Other long term (current) drug therapy: Secondary | ICD-10-CM

## 2021-06-21 NOTE — Progress Notes (Signed)
? ?  Subjective:  ? ? Patient ID: Blake [name], male    DOB: Sep 14, 1980, 41 y.o.   MRN: [id number] ? ?Diabetes ?He presents for his follow-up diabetic visit. He has type 2 diabetes mellitus. Risk factors for coronary artery disease include diabetes mellitus, dyslipidemia and hypertension. Current diabetic treatment includes oral agent (dual therapy). He is compliant with treatment all of the time.  ? ?Type 2 diabetes mellitus without complication, without long-term current use of insulin (HCC) - Plan: Hemoglobin A1c, Basic metabolic panel, Lipid panel, Hepatic function panel ? ?Other hyperlipidemia - Plan: Hemoglobin A1c, Basic metabolic panel, Lipid panel, Hepatic function panel ? ?High risk medication use - Plan: Hemoglobin A1c, Basic metabolic panel, Lipid panel, Hepatic function panel ? ?Fatty liver ? ?Obesity (BMI 30.0-34.9) ? ? ?Review of Systems ? ?   ?Objective:  ? Physical Exam ? ?General-in no acute distress ?Eyes-no discharge ?Lungs-respiratory rate normal, CTA ?CV-no murmurs,RRR ?Extremities skin warm dry no edema ?Neuro grossly normal ?Behavior normal, alert ? ? ? ?   ?Assessment & Plan:  ?1. Type 2 diabetes mellitus without complication, without long-term current use of insulin (HCC) ?Diabetes much better control continue current measures continue exercise and weight patient was congratulated ?- Hemoglobin A1c ?- Basic metabolic panel ?- Lipid panel ?- Hepatic function panel ? ?2. Other hyperlipidemia ?Cholesterol profile looks much better triglycerides still moderately elevated but much better than what they were patient is successfully staying away from alcohol and eating better ?- Hemoglobin A1c ?- Basic metabolic panel ?- Lipid panel ?- Hepatic function panel ? ?3. High risk medication use ?Check lab work before next visit follow-up in 4 months ?- Hemoglobin A1c ?- Basic metabolic panel ?- Lipid panel ?- Hepatic function panel ? ?4. Fatty liver ?Healthy diet regular activity recommended ? ?5.  Obesity (BMI 30.0-34.9) ?Weight reduction to continue via healthy diet portion control and exercise ? ?Recheck patient 4 months ? ? ?

## 2021-07-03 ENCOUNTER — Encounter: Payer: Self-pay | Admitting: Behavioral Health

## 2021-07-03 ENCOUNTER — Other Ambulatory Visit: Payer: Self-pay

## 2021-07-03 ENCOUNTER — Ambulatory Visit (INDEPENDENT_AMBULATORY_CARE_PROVIDER_SITE_OTHER): Payer: PRIVATE HEALTH INSURANCE | Admitting: Behavioral Health

## 2021-07-03 DIAGNOSIS — R454 Irritability and anger: Secondary | ICD-10-CM | POA: Diagnosis not present

## 2021-07-03 DIAGNOSIS — F1010 Alcohol abuse, uncomplicated: Secondary | ICD-10-CM | POA: Diagnosis not present

## 2021-07-03 DIAGNOSIS — F411 Generalized anxiety disorder: Secondary | ICD-10-CM

## 2021-07-03 DIAGNOSIS — F17200 Nicotine dependence, unspecified, uncomplicated: Secondary | ICD-10-CM

## 2021-07-03 DIAGNOSIS — F39 Unspecified mood [affective] disorder: Secondary | ICD-10-CM

## 2021-07-03 MED ORDER — LAMOTRIGINE 100 MG PO TABS: ORAL_TABLET | ORAL | 3 refills | Status: DC

## 2021-07-03 NOTE — Progress Notes (Signed)
[facility] ? ?Patient ID: Blake [name],  ?MRN: [id number] ? ?PCP: Babs Sciara, MD ? ?Date of Evaluation: 07/03/2021 ?Time spent:30 minutes ? ?Chief Complaint:  ?Chief Complaint   ?Anxiety; Irritability ; Medication Refill; Follow-up; Alcohol Problem ?  ? ? ?HISTORY/CURRENT STATUS: ?HPI ? ?41 year old male presents to this office for follow up and medication management. He says that he feels really good. Says his moods and irritability have continued to improve. His wife and family have noticed. He does not want to make any adjustments to his medications right now.  His anxiety today  is 2/10 and depression is 0/10. He sleeps about 7 hours per night now which is also new improvement. He does endorse sleep apnea and wears C-Pap at while sleeping. He denies mania, no psychosis, no SI/HI. ?  ?Prior psychiatric medication list: ?  ?Wellbutrin ?Zoloft ?Prozac ?Chantix ? ? ?Individual Medical History/ Review of Systems: Changes? :No  ? ?Allergies: Penicillins ? ?Current Medications:  ?Current Outpatient Medications:  ?  empagliflozin (JARDIANCE) 25 MG TABS tablet, Take 1 tablet (25 mg total) by mouth daily before breakfast., Disp: 90 tablet, Rfl: 1 ?  fenofibrate 160 MG tablet, Take 1 tablet (160 mg total) by mouth daily., Disp: 90 tablet, Rfl: 1 ?  glucose blood test strip, Test glucose once a day DX: E11.9, Disp: 50 each, Rfl: 5 ?  influenza vac split quadrivalent PF (FLUARIX) 0.5 ML injection, Inject into the muscle., Disp: 0.5 mL, Rfl: 0 ?  lamoTRIgine (LAMICTAL) 100 MG tablet, Take one half tablet 50 mg  twice daily., Disp: 30 tablet, Rfl: 3 ?  lisinopril (ZESTRIL) 20 MG tablet, Take 1 tablet (20 mg total) by mouth daily., Disp: 90 tablet, Rfl: 3 ?  metFORMIN (GLUCOPHAGE) 500 MG tablet, Take 2 tablets (1,000 mg total) by mouth 2 (two) times daily., Disp: 360 tablet, Rfl: 1 ?  naltrexone (DEPADE) 50 MG tablet, Take one tablet po daily, Disp: 30 tablet, Rfl: 2 ?  omega-3 acid ethyl esters (LOVAZA)  1 g capsule, Take 2 capsules (2 g total) by mouth 2 (two) times daily., Disp: 120 capsule, Rfl: 5 ?  rosuvastatin (CRESTOR) 40 MG tablet, Take 1 tablet (40 mg total) by mouth daily., Disp: 90 tablet, Rfl: 1 ?Medication Side Effects: none ? ?Family Medical/ Social History: Changes? No ? ?MENTAL HEALTH EXAM: ? ?There were no vitals taken for this visit.There is no height or weight on file to calculate BMI.  ?General Appearance: Casual, Neat, and Well Groomed  ?Eye Contact:  Good  ?Speech:  Clear and Coherent  ?Volume:  Normal  ?Mood:  NA  ?Affect:  Appropriate  ?Thought Process:  Coherent  ?Orientation:  Full (Time, Place, and Person)  ?Thought Content: Logical   ?Suicidal Thoughts:  No  ?Homicidal Thoughts:  No  ?Memory:  WNL  ?Judgement:  Good  ?Insight:  Good  ?Psychomotor Activity:  Normal  ?Concentration:  Concentration: Good  ?Recall:  Good  ?Fund of Knowledge: Good  ?Language: Good  ?Assets:  Desire for Improvement  ?ADL's:  Intact  ?Cognition: WNL  ?Prognosis:  Good  ? ? ?DIAGNOSES:  ?  ICD-10-CM   ?1. Generalized anxiety disorder  F41.1   ?  ?2. Unspecified mood (affective) disorder (HCC)  F39 lamoTRIgine (LAMICTAL) 100 MG tablet  ?  ?3. Irritability  R45.4 lamoTRIgine (LAMICTAL) 100 MG tablet  ?  ?4. Alcohol use disorder, mild, abuse  F10.10   ?  ?5. Smoking addiction  F17.200   ?  ? ? ?  Receiving Psychotherapy: yes ? ? ?RECOMMENDATIONS:  ? ?Greater than 50% of 30 min face to face time with patient was spent on counseling and coordination of care. We discussed his recent improvement with irritability and moods. Says his wife has noticed positive change. He is very happy with how Lamictal has helped him. We reviewed again  his readiness to quit smoking and his prior attempts to quit.  No medication changes this visit are indicated.  ?We agreed to; ?Continue Lamictal to 100 mg. Will take 50 mg tablet twice daily.  ?Will report worsening symptoms promptly ?To follow up in 4 weeks to reassess ?Provided emergency  contact information ?Monitor for any sign of rash. Please taking Lamictal and contact office immediately rash develops. Recommend seeking urgent medical attention if rash is severe and/or spreading quickly.  ?Reviewed PDMP ?  ? ?Joan Flores, NP  ?

## 2021-07-04 ENCOUNTER — Telehealth: Payer: Self-pay

## 2021-07-04 NOTE — Telephone Encounter (Signed)
LVM for pt to call me back to schedule sleep study  

## 2021-07-05 ENCOUNTER — Encounter: Payer: Self-pay | Admitting: Family Medicine

## 2021-07-12 ENCOUNTER — Ambulatory Visit: Payer: PRIVATE HEALTH INSURANCE | Admitting: Neurology

## 2021-07-12 DIAGNOSIS — R0683 Snoring: Secondary | ICD-10-CM

## 2021-07-12 DIAGNOSIS — R634 Abnormal weight loss: Secondary | ICD-10-CM

## 2021-07-12 DIAGNOSIS — E669 Obesity, unspecified: Secondary | ICD-10-CM

## 2021-07-12 DIAGNOSIS — Z9989 Dependence on other enabling machines and devices: Secondary | ICD-10-CM

## 2021-07-12 DIAGNOSIS — G4719 Other hypersomnia: Secondary | ICD-10-CM

## 2021-07-13 ENCOUNTER — Telehealth: Payer: Self-pay

## 2021-07-13 NOTE — Telephone Encounter (Signed)
LVM for pt to call me back to schedule sleep study  

## 2021-07-19 ENCOUNTER — Ambulatory Visit (INDEPENDENT_AMBULATORY_CARE_PROVIDER_SITE_OTHER): Payer: PRIVATE HEALTH INSURANCE | Admitting: Mental Health

## 2021-07-19 DIAGNOSIS — F39 Unspecified mood [affective] disorder: Secondary | ICD-10-CM

## 2021-07-19 NOTE — Progress Notes (Signed)
[facility] Initial Adult Exam ? ?Name: Blake [name] ?Date: 07/19/2021 ?MRN: [id number] ?DOB: 1981-01-27 ?PCP: Babs Sciara, MD ? ?Time spent: 53 minutes ? ?Reason for Visit /Presenting Problem: patient reports having a short temper. States he can control it at work, his anger mainly comes out w/ his oldest son who is now a teenager makes poor decisions; he is age 41. Tends to yell when upset, may call him names.  Started medication w/ Avelina Laine, NP.  Working to allow himself to calm down as opposed to reacting.  ?They took him out of mainstream school at age 42 to be homeschooled b/c he was often off task, impulsive, struggled w/ attention; he thinks he was on an IEP.  ?He stated his mother and sister also have a hx of temper issues.  ?Rx'd naltrexone from his PCP which has helped reduce etoh cravings. He stopped drinking about 5 months ago. ? ?Mental Status Exam: ?   ?Appearance:    Casual     ?Behavior:   Appropriate  ?Motor:   WNL  ?Speech/Language:    Clear and Coherent  ?Affect:   Full range   ?Mood:   Euthymic  ?Thought process:   Logical, linear, goal directed  ?Thought content:     WNL  ?Sensory/Perceptual disturbances:     none  ?Orientation:   x4  ?Attention:   Good  ?Concentration:   Good  ?Memory:   Intact  ?Fund of knowledge:    Consistent with age and development  ?Insight:     Good  ?Judgment:    Good  ?Impulse Control:   Good  ?  ? ?Reported Symptoms:  anger outbursts ? ?Risk Assessment: ?Danger to Self:  none ?Self-injurious Behavior: No ?Danger to Others: No ?Duty to Warn:no ?Physical Aggression / Violence:No  ?Patient / guardian was educated about steps to take if suicide or homicide risk level increases between visits: yes ?While future psychiatric events cannot be accurately predicted, the patient does not currently require acute inpatient psychiatric care and does not currently meet [facility] involuntary commitment criteria. ? ?Substance Abuse History: ?Current substance  abuse: No    ? ?Past Psychiatric History:   ?No previous psychological problems have been observed ?Outpatient Providers:none ?History of Psych Hospitalization: No  ?Psychological Testing:  none ? ?Abuse History: ?Victim- none reported ? ?Family History:  ?Raised by both parents. Father was present but his mother was more involved in his life.  ?Sister- age 63 ? ?Family History  ?Problem Relation Age of Onset  ? Schizophrenia Father   ? Alcohol abuse Father   ? Diabetes Father   ? Alcohol abuse Maternal Grandfather   ? Alcohol abuse Paternal Uncle   ? Sleep apnea Neg Hx   ? ? ?Living situation: the patient lives with their family ? ?Sexual Orientation:  Straight ? ?Relationship Status: married  x 16 years ?Name of spouse / other:Blake [name] ?            If a parent, number of children / ages:  Blake [name]- age 75, Blake [name]- age 74 ? ?Support Systems; spouse ?friends ? ?Financial Stress:  No  ? ?Income/Employment/Disability: Employment ? ?Military Service: No  ? ?Educational History: ?Education: some college ? ?Recreation/Hobbies: fishing, hunting, video games w/ his kids ? ?Stressors: interpersonal ? ?Strengths:  Supportive Relationships and Family ? ?Barriers:  none  ? ?Legal History: ?Pending legal issue / charges: none ?History of legal issue / charges: none ? ?Medical History/Surgical History:   ?Past Medical  History:  ?Diagnosis Date  ? Depression   ? Elevated ferritin 06/25/2019  ? High blood cholesterol level   ? Sleep apnea 01/07/13  ? cpap  ? Tobacco abuse 11/19/2018  ? Patient working very hard at quitting  ? Type 2 diabetes mellitus ([place]) 11/19/2018  ? ? ?Past Surgical History:  ?Procedure Laterality Date  ? ABDOMINAL SURGERY    ? APPENDECTOMY    ? CHOLECYSTECTOMY    ? ? ?Medications: ?Current Outpatient Medications  ?Medication Sig Dispense Refill  ? empagliflozin (JARDIANCE) 25 MG TABS tablet Take 1 tablet (25 mg total) by mouth daily before breakfast. 90 tablet 1  ? fenofibrate 160 MG tablet Take 1 tablet (160 mg total)  by mouth daily. 90 tablet 1  ? glucose blood test strip Test glucose once a day DX: E11.9 50 each 5  ? influenza vac split quadrivalent PF (FLUARIX) 0.5 ML injection Inject into the muscle. 0.5 mL 0  ? lamoTRIgine (LAMICTAL) 100 MG tablet Take one half tablet 50 mg  twice daily. 30 tablet 3  ? lisinopril (ZESTRIL) 20 MG tablet Take 1 tablet (20 mg total) by mouth daily. 90 tablet 3  ? metFORMIN (GLUCOPHAGE) 500 MG tablet Take 2 tablets (1,000 mg total) by mouth 2 (two) times daily. 360 tablet 1  ? naltrexone (DEPADE) 50 MG tablet Take one tablet po daily 30 tablet 2  ? omega-3 acid ethyl esters (LOVAZA) 1 g capsule Take 2 capsules (2 g total) by mouth 2 (two) times daily. 120 capsule 5  ? rosuvastatin (CRESTOR) 40 MG tablet Take 1 tablet (40 mg total) by mouth daily. 90 tablet 1  ? ?No current facility-administered medications for this visit.  ? ? ?Allergies  ?Allergen Reactions  ? Penicillins Other (See Comments)  ?  Unknown childhood reaction  ? ? ?Diagnoses:  ?  ICD-10-CM   ?1. Unspecified mood (affective) disorder (HCC)  F39   ?  ? ? ?Plan of Care: TBD ? ? ?[place], [facility]  ? ? ? ?

## 2021-07-23 ENCOUNTER — Other Ambulatory Visit: Payer: Self-pay | Admitting: Family Medicine

## 2021-07-31 ENCOUNTER — Ambulatory Visit (INDEPENDENT_AMBULATORY_CARE_PROVIDER_SITE_OTHER): Payer: PRIVATE HEALTH INSURANCE | Admitting: Neurology

## 2021-07-31 DIAGNOSIS — R0683 Snoring: Secondary | ICD-10-CM

## 2021-07-31 DIAGNOSIS — G4733 Obstructive sleep apnea (adult) (pediatric): Secondary | ICD-10-CM

## 2021-07-31 DIAGNOSIS — G4719 Other hypersomnia: Secondary | ICD-10-CM

## 2021-07-31 DIAGNOSIS — E669 Obesity, unspecified: Secondary | ICD-10-CM

## 2021-07-31 DIAGNOSIS — R634 Abnormal weight loss: Secondary | ICD-10-CM

## 2021-08-03 NOTE — Procedures (Signed)
? ?  [facility] ? ?HOME SLEEP TEST (Watch PAT) REPORT ? ?STUDY DATE: 07/31/2021 ? ?DOB: 1980-07-03 ? ?MRN: [id number] ? ?ORDERING CLINICIAN: Star Age, MD, PhD ?  ?REFERRING CLINICIAN: Kathyrn Drown, MD  ? ?CLINICAL INFORMATION/HISTORY: 41 year old right-handed gentleman with an underlying medical history of diabetes, fatty liver, smoking, hyperlipidemia, and obesity, who was previously diagnosed with obstructive sleep apnea.  He has an older CPAP machine and presents for reevaluation. ? ?Epworth sleepiness score: 11/24. ? ?BMI: 33.6 kg/m? ? ?FINDINGS:  ? ?Sleep Summary:  ? ?Total Recording Time (hours, min): 7 hours, 47 minutes ? ?Total Sleep Time (hours, min):  6 hours, 39 minutes  ? ?Percent REM (%):    17.8%  ? ?Respiratory Indices:  ? ?Calculated pAHI (per hour):  6.5/hour        ? ?REM pAHI:    13.5/hour      ? ?NREM pAHI: 5/hour ? ?Oxygen Saturation Statistics:  ?  ?Oxygen Saturation (%) Mean: 94%  ? ?Minimum oxygen saturation (%):                 85%  ? ?O2 Saturation Range (%): 85-98%   ? ?O2 Saturation (minutes) <=88%: 0 min ? ?Pulse Rate Statistics:  ? ?Pulse Mean (bpm):    61/min   ? ?Pulse Range (48-104/min)  ? ?IMPRESSION: OSA (obstructive sleep apnea), mild ? ?RECOMMENDATION:  ?This home sleep test demonstrates overall mild obstructive sleep apnea with a total AHI of 6.5/hour and O2 nadir of 85%.  Intermittent mild to moderate snoring was detected.  The patient has been stable on CPAP therapy over the years.  If he is agreeable to pursuing ongoing PAP therapy, I recommend treatment with an AutoPap machine, he would likely qualify for new equipment. Alternative treatments include weight loss along with avoidance of the supine sleep position, or an oral appliance in appropriate candidates.   ?Please note that untreated obstructive sleep apnea may carry additional perioperative morbidity. Patients with significant obstructive sleep apnea should receive perioperative PAP therapy and  the surgeons and particularly the anesthesiologist should be informed of the diagnosis and the severity of the sleep disordered breathing. ?The patient should be cautioned not to drive, work at heights, or operate dangerous or heavy equipment when tired or sleepy. Review and reiteration of good sleep hygiene measures should be pursued with any patient. ?Other causes of the patient's symptoms, including circadian rhythm disturbances, an underlying mood disorder, medication effect and/or an underlying medical problem cannot be ruled out based on this test. Clinical correlation is recommended.  ? ?The patient and his referring provider will be notified of the test results. The patient will be seen in follow up in sleep clinic at [facility]. ? ?I certify that I have reviewed the raw data recording prior to the issuance of this report in accordance with the standards of the American Academy of Sleep Medicine (AASM). ? ?INTERPRETING PHYSICIAN:  ? ?Star Age, MD, PhD  ?Board Certified in Neurology and Sleep Medicine ? ?[facility] ?[address], [address] ?[place], [place] [address] ?[phone] ? ? ? ? ? ? ? ? ? ? ? ? ? ? ? ? ? ?

## 2021-08-03 NOTE — Progress Notes (Signed)
See procedure note.

## 2021-08-07 ENCOUNTER — Telehealth: Payer: Self-pay

## 2021-08-07 NOTE — Telephone Encounter (Signed)
-----   Message from Huston Foley, MD sent at 08/03/2021  6:02 PM EDT ----- ?Patient referred by Dr. Gerda Diss.  He has an older CPAP machine.  He presented for reevaluation on 05/15/2021 and had a home sleep test on 01/30/2022. ?Please confirm that patient did not use his CPAP machine the night of his home sleep testing. ?Please call and notify the patient that the recent home sleep test showed mild obstructive sleep apnea.  He had intermittent mild to moderate snoring.  He has been on CPAP therapy for years, I would be happy to write for a new autoPAP machine through a DME company of his choosing or his current DME company.   ?We will need a FU in sleep clinic for 10 weeks post-PAP set up, please arrange that with me or one of our NPs. Thanks,  ? ?Huston Foley, MD, PhD ?[facility]) ? ? ? ? ?

## 2021-08-07 NOTE — Telephone Encounter (Signed)
I called patient to discuss his sleep study results. No answer, left a message asking him to call me back. ? ?If patient returns call another day please route to POD 4. ?

## 2021-08-08 NOTE — Telephone Encounter (Signed)
I called pt. I advised pt that Dr. Frances Furbish reviewed their sleep study results and found that pt has mild osa. Patient confirmed that he was not using his current cpap the night of the sleep study. Dr. Frances Furbish recommends that pt start an auto-pap. I reviewed PAP compliance expectations with the pt. Pt is agreeable to starting an auto-PAP. I advised pt that an order will be sent to a DME, [facility], and [facility] will call the pt within about one week after they file with the pt's insurance. [facility] will show the pt how to use the machine, fit for masks, and troubleshoot the auto-PAP if needed. A follow up appt was made for insurance purposes with Dr. Frances Furbish on 11/21/21 at 2:15pm. Pt verbalized understanding to arrive 15 minutes early and bring their auto-PAP. A letter with all of this information in it will be mailed to the pt as a reminder. I verified with the pt that the address we have on file is correct. Pt verbalized understanding of results. Pt had no questions at this time but was encouraged to call back if questions arise. I have sent the order to [facility] and have received confirmation that they have received the order. ? ?

## 2021-08-21 ENCOUNTER — Ambulatory Visit: Payer: PRIVATE HEALTH INSURANCE | Admitting: Mental Health

## 2021-08-21 NOTE — Telephone Encounter (Signed)
Received fax from [facility].  ? ?[facility]  ?Setup 08/16/21  ?Appt needed 09/16/21-11/16/21 ?Patient's appt scheduled 11/21/21 at 2:15 PM ?

## 2021-08-28 ENCOUNTER — Encounter: Payer: Self-pay | Admitting: Behavioral Health

## 2021-08-28 ENCOUNTER — Ambulatory Visit (INDEPENDENT_AMBULATORY_CARE_PROVIDER_SITE_OTHER): Payer: PRIVATE HEALTH INSURANCE | Admitting: Behavioral Health

## 2021-08-28 DIAGNOSIS — F39 Unspecified mood [affective] disorder: Secondary | ICD-10-CM

## 2021-08-28 DIAGNOSIS — R454 Irritability and anger: Secondary | ICD-10-CM | POA: Diagnosis not present

## 2021-08-28 DIAGNOSIS — F17200 Nicotine dependence, unspecified, uncomplicated: Secondary | ICD-10-CM

## 2021-08-28 DIAGNOSIS — F411 Generalized anxiety disorder: Secondary | ICD-10-CM | POA: Diagnosis not present

## 2021-08-28 DIAGNOSIS — F1010 Alcohol abuse, uncomplicated: Secondary | ICD-10-CM

## 2021-08-28 NOTE — Progress Notes (Signed)
[facility]  Patient ID: Blake [name],  MRN: [id number]  PCP: Kathyrn Drown, MD  Date of Evaluation: 08/28/2021 Time spent:20 minutes  Chief Complaint:  Chief Complaint   Anxiety; Depression; Follow-up; Medication Refill     HISTORY/CURRENT STATUS: HPI  41 year old male presents to this office for follow up and medication management. No changes since last visit. He says that he feels really good. Says his moods and irritability have continued to improve. His wife and family have noticed. He does not want to make any adjustments to his medications right now.  His anxiety today  is 2/10 and depression is 0/10. He sleeps about 7 hours per night now which is also new improvement. He does endorse sleep apnea and wears C-Pap at while sleeping. He denies mania, no psychosis, no SI/HI.   Prior psychiatric medication list:   Wellbutrin Zoloft Prozac Chantix    Individual Medical History/ Review of Systems: Changes? :No   Allergies: Penicillins  Current Medications:  Current Outpatient Medications:    empagliflozin (JARDIANCE) 25 MG TABS tablet, TAKE 1 TABLET BY MOUTH DAILY  BEFORE BREAKFAST, Disp: 90 tablet, Rfl: 1   fenofibrate 160 MG tablet, Take 1 tablet (160 mg total) by mouth daily., Disp: 90 tablet, Rfl: 1   glucose blood test strip, Test glucose once a day DX: E11.9, Disp: 50 each, Rfl: 5   influenza vac split quadrivalent PF (FLUARIX) 0.5 ML injection, Inject into the muscle., Disp: 0.5 mL, Rfl: 0   lamoTRIgine (LAMICTAL) 100 MG tablet, Take one half tablet 50 mg  twice daily., Disp: 30 tablet, Rfl: 3   lisinopril (ZESTRIL) 20 MG tablet, Take 1 tablet (20 mg total) by mouth daily., Disp: 90 tablet, Rfl: 3   metFORMIN (GLUCOPHAGE) 500 MG tablet, Take 2 tablets (1,000 mg total) by mouth 2 (two) times daily., Disp: 360 tablet, Rfl: 1   naltrexone (DEPADE) 50 MG tablet, Take one tablet po daily (Patient not taking: Reported on 08/28/2021), Disp: 30 tablet, Rfl:  2   omega-3 acid ethyl esters (LOVAZA) 1 g capsule, Take 2 capsules (2 g total) by mouth 2 (two) times daily., Disp: 120 capsule, Rfl: 5   rosuvastatin (CRESTOR) 40 MG tablet, Take 1 tablet (40 mg total) by mouth daily., Disp: 90 tablet, Rfl: 1 Medication Side Effects: none  Family Medical/ Social History: Changes? No  MENTAL HEALTH EXAM:  There were no vitals taken for this visit.There is no height or weight on file to calculate BMI.  General Appearance: Casual, Neat, and Well Groomed  Eye Contact:  Good  Speech:  Clear and Coherent  Volume:  Normal  Mood:  NA  Affect:  Appropriate  Thought Process:  Coherent  Orientation:  Full (Time, Place, and Person)  Thought Content: Logical   Suicidal Thoughts:  No  Homicidal Thoughts:  No  Memory:  WNL  Judgement:  Good  Insight:  Good  Psychomotor Activity:  Normal  Concentration:  Concentration: Good  Recall:  Good  Fund of Knowledge: Good  Language: Good  Assets:  Desire for Improvement  ADL's:  Intact  Cognition: WNL  Prognosis:  Good    DIAGNOSES:    ICD-10-CM   1. Unspecified mood (affective) disorder (HCC)  F39     2. Irritability  R45.4     3. Generalized anxiety disorder  F41.1     4. Alcohol use disorder, mild, abuse  F10.10     5. Smoking addiction  F17.200  Receiving Psychotherapy: yes   RECOMMENDATIONS:    Greater than 50% of 30 min face to face time with patient was spent on counseling and coordination of care. No changes since last visit. We discussed his recent improvement with irritability and moods. Says his wife has noticed positive change. He is very happy with how Lamictal has helped him. We reviewed again  his readiness to quit smoking and his prior attempts to quit.  No medication changes this visit are indicated.  We agreed to; Continue Lamictal to 100 mg. Will take 50 mg tablet twice daily.  Will report worsening symptoms promptly To follow up in 3 monthsto reassess Provided emergency  contact information Monitor for any sign of rash. Please taking Lamictal and contact office immediately rash develops. Recommend seeking urgent medical attention if rash is severe and/or spreading quickly.  Reviewed PDMP     Elwanda Brooklyn, NP

## 2021-08-29 ENCOUNTER — Other Ambulatory Visit: Payer: Self-pay | Admitting: Family Medicine

## 2021-09-07 ENCOUNTER — Ambulatory Visit: Payer: PRIVATE HEALTH INSURANCE | Admitting: Mental Health

## 2021-09-12 ENCOUNTER — Encounter: Payer: Self-pay | Admitting: Family Medicine

## 2021-09-13 ENCOUNTER — Telehealth: Payer: Self-pay | Admitting: Family Medicine

## 2021-09-13 DIAGNOSIS — M779 Enthesopathy, unspecified: Secondary | ICD-10-CM

## 2021-09-13 DIAGNOSIS — R200 Anesthesia of skin: Secondary | ICD-10-CM

## 2021-09-13 MED ORDER — CHANTIX STARTING MONTH PAK 0.5 MG X 11 & 1 MG X 42 PO TBPK: ORAL_TABLET | ORAL | 0 refills | Status: DC

## 2021-09-13 MED ORDER — VARENICLINE TARTRATE 1 MG PO TABS: ORAL_TABLET | ORAL | 0 refills | Status: DC

## 2021-09-13 NOTE — Telephone Encounter (Signed)
Nurses-please see telephone message regarding details We will go ahead with referral to neurosurgery We will also go ahead with prescription of Chantix  Brett Canales should take all regular follow-up visits with [place] and if he is having any problems with the Chantix to notify us and stop it. (Chantix rarely can cause depression and suicidal ideation if that occurs immediately stop medication and follow-up immediately either behavioral health, ER or [place])

## 2021-09-13 NOTE — Telephone Encounter (Signed)
Please see my chart message May have referral to [facility] Family request not Dr.Cram recommend referral because of numbness in the arm with bone spurs in the neck  (If neurosurgery requires MRI then patient will need to do office visit with [place] first in order to schedule up an MRI)  Patient requests Chantix Starter pack Also maintenance for 3 months

## 2021-09-13 NOTE — Telephone Encounter (Signed)
Referral placed and medication sent to pharmacy. MyChart message sent to pt.

## 2021-09-21 ENCOUNTER — Ambulatory Visit: Payer: PRIVATE HEALTH INSURANCE | Admitting: Mental Health

## 2021-09-25 ENCOUNTER — Encounter (HOSPITAL_COMMUNITY): Payer: Self-pay

## 2021-09-25 ENCOUNTER — Emergency Department (HOSPITAL_COMMUNITY): Payer: PRIVATE HEALTH INSURANCE

## 2021-09-25 ENCOUNTER — Observation Stay (HOSPITAL_COMMUNITY)
Admission: EM | Admit: 2021-09-25 | Discharge: 2021-09-26 | Disposition: A | Discharge status: Home / Self Care | Payer: PRIVATE HEALTH INSURANCE | Attending: Internal Medicine | Admitting: Internal Medicine

## 2021-09-25 ENCOUNTER — Other Ambulatory Visit: Payer: Self-pay

## 2021-09-25 ENCOUNTER — Observation Stay (HOSPITAL_COMMUNITY): Payer: PRIVATE HEALTH INSURANCE

## 2021-09-25 DIAGNOSIS — R42 Dizziness and giddiness: Secondary | ICD-10-CM | POA: Diagnosis present

## 2021-09-25 DIAGNOSIS — E119 Type 2 diabetes mellitus without complications: Secondary | ICD-10-CM | POA: Insufficient documentation

## 2021-09-25 DIAGNOSIS — G459 Transient cerebral ischemic attack, unspecified: Secondary | ICD-10-CM | POA: Diagnosis not present

## 2021-09-25 DIAGNOSIS — E669 Obesity, unspecified: Secondary | ICD-10-CM | POA: Insufficient documentation

## 2021-09-25 DIAGNOSIS — K760 Fatty (change of) liver, not elsewhere classified: Secondary | ICD-10-CM

## 2021-09-25 DIAGNOSIS — R200 Anesthesia of skin: Secondary | ICD-10-CM

## 2021-09-25 DIAGNOSIS — Z7984 Long term (current) use of oral hypoglycemic drugs: Secondary | ICD-10-CM | POA: Diagnosis not present

## 2021-09-25 DIAGNOSIS — F320 Major depressive disorder, single episode, mild: Secondary | ICD-10-CM | POA: Diagnosis present

## 2021-09-25 DIAGNOSIS — Z79899 Other long term (current) drug therapy: Secondary | ICD-10-CM | POA: Insufficient documentation

## 2021-09-25 DIAGNOSIS — Z720 Tobacco use: Secondary | ICD-10-CM

## 2021-09-25 DIAGNOSIS — I10 Essential (primary) hypertension: Secondary | ICD-10-CM | POA: Diagnosis not present

## 2021-09-25 DIAGNOSIS — R4701 Aphasia: Secondary | ICD-10-CM

## 2021-09-25 DIAGNOSIS — E781 Pure hyperglyceridemia: Secondary | ICD-10-CM

## 2021-09-25 DIAGNOSIS — F17210 Nicotine dependence, cigarettes, uncomplicated: Secondary | ICD-10-CM | POA: Insufficient documentation

## 2021-09-25 DIAGNOSIS — G4733 Obstructive sleep apnea (adult) (pediatric): Secondary | ICD-10-CM

## 2021-09-25 DIAGNOSIS — Z6832 Body mass index (BMI) 32.0-32.9, adult: Secondary | ICD-10-CM | POA: Diagnosis not present

## 2021-09-25 DIAGNOSIS — R270 Ataxia, unspecified: Secondary | ICD-10-CM

## 2021-09-25 HISTORY — DX: Essential (primary) hypertension: I10

## 2021-09-25 LAB — CBC
HCT: 46 % (ref 39.0–52.0)
Hemoglobin: 15.5 g/dL (ref 13.0–17.0)
MCH: 30.3 pg (ref 26.0–34.0)
MCHC: 33.7 g/dL (ref 30.0–36.0)
MCV: 90 fL (ref 80.0–100.0)
Platelets: 176 10*3/uL (ref 150–400)
RBC: 5.11 MIL/uL (ref 4.22–5.81)
RDW: 13.8 % (ref 11.5–15.5)
WBC: 8.3 10*3/uL (ref 4.0–10.5)
nRBC: 0 % (ref 0.0–0.2)

## 2021-09-25 LAB — COMPREHENSIVE METABOLIC PANEL
ALT: 36 U/L (ref 0–44)
AST: 28 U/L (ref 15–41)
Albumin: 4.4 g/dL (ref 3.5–5.0)
Alkaline Phosphatase: 57 U/L (ref 38–126)
Anion gap: 9 (ref 5–15)
BUN: 14 mg/dL (ref 6–20)
CO2: 25 mmol/L (ref 22–32)
Calcium: 9 mg/dL (ref 8.9–10.3)
Chloride: 104 mmol/L (ref 98–111)
Creatinine, Ser: 0.7 mg/dL (ref 0.61–1.24)
GFR, Estimated: >60 mL/min (ref >60)
Glucose, Bld: 90 mg/dL (ref 70–99)
Potassium: 4 mmol/L (ref 3.5–5.1)
Sodium: 138 mmol/L (ref 135–145)
Total Bilirubin: 1 mg/dL (ref 0.3–1.2)
Total Protein: 7.3 g/dL (ref 6.5–8.1)

## 2021-09-25 LAB — CBG MONITORING, ED: Glucose-Capillary: 97 mg/dL (ref 70–99)

## 2021-09-25 LAB — GLUCOSE, CAPILLARY: Glucose-Capillary: 181 mg/dL — ABNORMAL HIGH (ref 70–99)

## 2021-09-25 MED ORDER — STROKE: EARLY STAGES OF RECOVERY BOOK
Freq: Once | Status: AC
Start: 2021-09-26 — End: 2021-09-26
  Filled 2021-09-25: qty 1

## 2021-09-25 MED ORDER — ASPIRIN 81 MG PO CHEW
81.0000 mg | CHEWABLE_TABLET | Freq: Once | ORAL | Status: AC
  Administered 2021-09-25: 81 mg via ORAL
  Filled 2021-09-25: qty 1

## 2021-09-25 MED ORDER — IOHEXOL 350 MG/ML SOLN
75.0000 mL | Freq: Once | INTRAVENOUS | Status: AC | PRN
  Administered 2021-09-25: 75 mL via INTRAVENOUS

## 2021-09-25 MED ORDER — INSULIN ASPART 100 UNIT/ML IJ SOLN: 0.0000 [IU] | Freq: Three times a day (TID) | INTRAMUSCULAR | Status: DC

## 2021-09-25 MED ORDER — CLOPIDOGREL BISULFATE 75 MG PO TABS
300.0000 mg | ORAL_TABLET | Freq: Once | ORAL | Status: AC
  Administered 2021-09-25: 300 mg via ORAL
  Filled 2021-09-25: qty 4

## 2021-09-25 MED ORDER — LAMOTRIGINE 25 MG PO TABS
50.0000 mg | ORAL_TABLET | Freq: Two times a day (BID) | ORAL | Status: DC
  Administered 2021-09-25 – 2021-09-26 (×2): 50 mg via ORAL
  Filled 2021-09-25 (×2): qty 2

## 2021-09-25 MED ORDER — ROSUVASTATIN CALCIUM 20 MG PO TABS
40.0000 mg | ORAL_TABLET | Freq: Every day | ORAL | Status: DC
  Administered 2021-09-26: 40 mg via ORAL
  Filled 2021-09-25: qty 2

## 2021-09-25 MED ORDER — VARENICLINE TARTRATE 0.5 MG PO TABS
1.0000 mg | ORAL_TABLET | Freq: Two times a day (BID) | ORAL | Status: DC
  Administered 2021-09-25 – 2021-09-26 (×2): 1 mg via ORAL
  Filled 2021-09-25 (×4): qty 1

## 2021-09-25 MED ORDER — CLOPIDOGREL BISULFATE 75 MG PO TABS
75.0000 mg | ORAL_TABLET | Freq: Every day | ORAL | Status: DC
  Administered 2021-09-26: 75 mg via ORAL
  Filled 2021-09-25: qty 1

## 2021-09-25 NOTE — H&P (Signed)
History and Physical    Patient: Blake [name] [id number] DOB: [birth year] DOA: 09/25/2021 DOS: the patient was seen and examined on 09/25/2021 PCP: Babs Sciara, MD  Patient coming from: Home  Chief Complaint:  Chief Complaint  Patient presents with   Dizziness   HPI: Blake [name] is a 41 y.o. male with medical history significant of hypertension, hypertriglyceridemia, T2DM and intermittent tobacco use who presents to the emergency department due to numbness sensation from the right elbow to the fingers which occurred around 9:30 AM this morning while at work, he stood up and felt dizzy with a sensation of imbalance, on attempt to walk, he felt like he was staggering like a drunk, he also had trouble in finding words and was talking slowly, patient also complained of headache.  But he denies slurred speech, drooling, chest pain, shortness of breath, nausea, vomiting, facial asymmetry.  Patient works in Consulting civil engineer with computers.  He decided to go to ED for further evaluation and management.  While in the ED, symptoms resolved without taking any medication or any intervention.   ED course: In the emergency department, he was initially bradycardic with HR in the mid 50s, but this eventually improved.  BP was 134/83, other vital signs were within normal range.  Work-up in the ED showed normal CBC and BMP. CT head without contrast showed no acute intracranial abnormality Aspirin and loading dose of Plavix was given.  Teleneurology was consulted and recommended further stroke work-up.  Hospitalist was asked to admit patient for further evaluation and management.  Review of Systems: Review of systems as noted in the HPI. All other systems reviewed and are negative.  Past Medical History:  Diagnosis Date   Depression    Elevated ferritin 06/25/2019   High blood cholesterol level    Hypertension    Sleep apnea 01/07/2013   cpap   Tobacco abuse 11/19/2018   Patient working very hard at  quitting   Type 2 diabetes mellitus (HCC) 11/19/2018   Past Surgical History:  Procedure Laterality Date   ABDOMINAL SURGERY     APPENDECTOMY     CHOLECYSTECTOMY      Social History:  reports that he has been smoking cigarettes. He has a 7.50 pack-year smoking history. He has never used smokeless tobacco. He reports current alcohol use. He reports that he does not use drugs.   Allergies  Allergen Reactions   Penicillins Other (See Comments)    Unknown childhood reaction    Family History  Problem Relation Age of Onset   Schizophrenia Father    Alcohol abuse Father    Diabetes Father    Alcohol abuse Maternal Grandfather    Alcohol abuse Paternal Uncle    Sleep apnea Neg Hx      Prior to Admission medications   Medication Sig Start Date End Date Taking? Authorizing Provider  empagliflozin (JARDIANCE) 25 MG TABS tablet TAKE 1 TABLET BY MOUTH DAILY  BEFORE BREAKFAST 07/24/21  Yes Luking, Jonna Coup, MD  lamoTRIgine (LAMICTAL) 100 MG tablet Take one half tablet 50 mg  twice daily. Patient taking differently: Take 50 mg by mouth 2 (two) times daily. Take one half tablet 50 mg  twice daily. 07/03/21  Yes White, Arlys John A, NP  lisinopril (ZESTRIL) 20 MG tablet Take 1 tablet (20 mg total) by mouth daily. 03/23/21  Yes Babs Sciara, MD  metFORMIN (GLUCOPHAGE) 500 MG tablet TAKE 2 TABLETS BY MOUTH TWICE  DAILY 08/30/21  Yes Luking, Scott A,  MD  rosuvastatin (CRESTOR) 40 MG tablet TAKE 1 TABLET BY MOUTH DAILY 08/30/21  Yes Luking, Jonna Coup, MD  varenicline (CHANTIX CONTINUING MONTH PAK) 1 MG tablet Take 1 tablet by mouth Twice a day Patient taking differently: Take 1 mg by mouth 2 (two) times daily. 09/13/21  Yes Luking, Jonna Coup, MD  Varenicline Tartrate, Starter, (CHANTIX STARTING MONTH PAK) 0.5 MG X 11 & 1 MG X 42 TBPK Take 1 tablet by mouth once daily Patient not taking: Reported on 09/25/2021 09/13/21   Babs Sciara, MD  glucose blood test strip Test glucose once a day DX: E11.9 02/24/19    Babs Sciara, MD    Physical Exam: BP 136/74 (BP Location: Right Arm)   Pulse 66   Temp 98.2 F (36.8 C)   Resp (!) 21   Ht 5\' 11"  (1.803 m)   Wt 107.5 kg   SpO2 98%   BMI 33.04 kg/m   General: 41 y.o. year-old male well developed well nourished in no acute distress.  Alert and oriented x3. HEENT: NCAT, EOMI Neck: Supple, trachea medial Cardiovascular: Regular rate and rhythm with no rubs or gallops.  No thyromegaly or JVD noted.  No lower extremity edema. 2/4 pulses in all 4 extremities. Respiratory: Clear to auscultation with no wheezes or rales. Good inspiratory effort. Abdomen: Soft, nontender nondistended with normal bowel sounds x4 quadrants. Muskuloskeletal: No cyanosis, clubbing or edema noted bilaterally Neuro: CN II-XII intact, strength 5/5 x 4, sensation, reflexes intact. NIHSS 0 Skin: No ulcerative lesions noted or rashes Psychiatry: Judgement and insight appear normal. Mood is appropriate for condition and setting          Labs on Admission:  Basic Metabolic Panel: Recent Labs  Lab 09/25/21 1206  NA 138  K 4.0  CL 104  CO2 25  GLUCOSE 90  BUN 14  CREATININE 0.70  CALCIUM 9.0   Liver Function Tests: Recent Labs  Lab 09/25/21 1206  AST 28  ALT 36  ALKPHOS 57  BILITOT 1.0  PROT 7.3  ALBUMIN 4.4   No results for input(s): "LIPASE", "AMYLASE" in the last 168 hours. No results for input(s): "AMMONIA" in the last 168 hours. CBC: Recent Labs  Lab 09/25/21 1206  WBC 8.3  HGB 15.5  HCT 46.0  MCV 90.0  PLT 176   Cardiac Enzymes: No results for input(s): "CKTOTAL", "CKMB", "CKMBINDEX", "TROPONINI" in the last 168 hours.  BNP (last 3 results) No results for input(s): "BNP" in the last 8760 hours.  ProBNP (last 3 results) No results for input(s): "PROBNP" in the last 8760 hours.  CBG: Recent Labs  Lab 09/25/21 1047  GLUCAP 97    Radiological Exams on Admission: CT HEAD WO CONTRAST (09/27/21)  Result Date: 09/25/2021 CLINICAL DATA:   Dizziness, headache right arm numbness. EXAM: CT HEAD WITHOUT CONTRAST TECHNIQUE: Contiguous axial images were obtained from the base of the skull through the vertex without intravenous contrast. RADIATION DOSE REDUCTION: This exam was performed according to the departmental dose-optimization program which includes automated exposure control, adjustment of the mA and/or kV according to patient size and/or use of iterative reconstruction technique. COMPARISON:  Head CT April 13, 2014 FINDINGS: Brain: No evidence of acute infarction, hemorrhage, hydrocephalus, extra-axial collection or mass lesion/mass effect. Vascular: No hyperdense vessel or unexpected calcification. Skull: Normal. Negative for fracture or focal lesion. Sinuses/Orbits: Visualized portions of the paranasal sinuses are predominantly clear. Orbits are unremarkable. Other: Mastoid air cells are predominantly clear. IMPRESSION: No acute intracranial  abnormality. Electronically Signed   By: Maudry Mayhew M.D.   On: 09/25/2021 13:16    EKG: I independently viewed the EKG done and my findings are as followed: Normal sinus rhythm at a rate of 73 bpm  Assessment/Plan Present on Admission:  Transient ischemic attack (TIA)  Obesity (BMI 30.0-34.9)  Hypertriglyceridemia  Depression, major, single episode, mild (HCC)  Principal Problem:   Transient ischemic attack (TIA) Active Problems:   Hypertriglyceridemia   Obesity (BMI 30.0-34.9)   Depression, major, single episode, mild (HCC)   Type 2 diabetes mellitus (HCC)   Essential hypertension  Transient ischemic attack Patient will be admitted to telemetry unit  CT angiography of head and neck will be checked Echocardiogram in the morning MRI of brain without contrast in the morning Continue aspirin and loading dose of Plavix, then continue with Plavix 75 mg p.o. daily  Continue statin Continue fall precautions and neuro checks Lipid panel and hemoglobin A1c will be checked Continue  PT/OT eval and treat Bedside swallow eval by nursing prior to diet Tele neurology will be consulted to follow-up with patient in the morning.   Essential hypertension  Antihypertensives PRN if Blood pressure is greater than 220/120 or there is a concern for End organ damage/contraindications for permissive HTN. If blood pressure is greater than 220/120 give labetalol PO or IV or Vasotec IV with a goal of 15% reduction in BP during the first 24 hours.  Hypertriglyceridemia Continue Crestor  Type 2 diabetes mellitus Hemoglobin A1c about 3 months ago was 6.8 Continue ISS and hypoglycemic protocol  Obesity (BMI 32.08 kg/m) Diet and lifestyle modification  Intermittent tobacco use Continue Chantix  Depression Continue Lamictal  DVT prophylaxis: SCDs  Code Status: Full code  Consults: Teleneurology  Family Communication: Wife at bedside (all questions answered to satisfaction)  Severity of Illness: The appropriate patient status for this patient is OBSERVATION. Observation status is judged to be reasonable and necessary in order to provide the required intensity of service to ensure the patient's safety. The patient's presenting symptoms, physical exam findings, and initial radiographic and laboratory data in the context of their medical condition is felt to place them at decreased risk for further clinical deterioration. Furthermore, it is anticipated that the patient will be medically stable for discharge from the hospital within 2 midnights of admission.   Author: Frankey Shown, DO 09/25/2021 9:56 PM  For on call review [email or web address].

## 2021-09-25 NOTE — ED Notes (Signed)
Patient states his symptoms have resolved.

## 2021-09-25 NOTE — ED Provider Notes (Signed)
[facility] EMERGENCY DEPARTMENT Provider Note   CSN: [id number] Arrival date & time: 09/25/21  1027     History  Chief Complaint  Patient presents with   Dizziness    Blake [name] is a 41 y.o. male presenting with new onset dizziness and headache/migraine starting around 930 this morning.  Felt completely normal when he woke up around 7 AM.  No prior Hx of headaches.  Numbness located from the right elbow to the right thumb and index finger.  Dizziness described as trying to walk but feeling off balance as if he was "drunk walking".  Also described feeling as if he was having trouble finding words and talking slowly.  Denied vision changes, neck pain, recent fevers, facial asymmetry, chest pain, shortness of breath, or weakness.  States that his symptoms were present while he was waiting in the waiting room, and then gradually resolved about an hour ago.  No active complaints at this time.  Also reports chronic history of bilateral upper extremity numbness that affects the forearms.  Works in Consulting civil engineer with computers.  Hx of HTN, hypertriglyceridemia, fatty liver, DMT2, intermittent tobacco use, depression.    The history is provided by the patient and medical records.  Dizziness Associated symptoms: headaches        Home Medications Prior to Admission medications   Medication Sig Start Date End Date Taking? Authorizing Provider  varenicline (CHANTIX CONTINUING MONTH PAK) 1 MG tablet Take 1 tablet by mouth Twice a day 09/13/21   Babs Sciara, MD  Varenicline Tartrate, Starter, (CHANTIX STARTING MONTH PAK) 0.5 MG X 11 & 1 MG X 42 TBPK Take 1 tablet by mouth once daily 09/13/21   Babs Sciara, MD  empagliflozin (JARDIANCE) 25 MG TABS tablet TAKE 1 TABLET BY MOUTH DAILY  BEFORE BREAKFAST 07/24/21   Babs Sciara, MD  fenofibrate 160 MG tablet Take 1 tablet (160 mg total) by mouth daily. 08/03/20   Babs Sciara, MD  glucose blood test strip Test glucose once a day DX: E11.9 02/24/19    Babs Sciara, MD  influenza vac split quadrivalent PF (FLUARIX) 0.5 ML injection Inject into the muscle. 01/23/21   Judyann Munson, MD  lamoTRIgine (LAMICTAL) 100 MG tablet Take one half tablet 50 mg  twice daily. 07/03/21   Joan Flores, NP  lisinopril (ZESTRIL) 20 MG tablet Take 1 tablet (20 mg total) by mouth daily. 03/23/21   Babs Sciara, MD  metFORMIN (GLUCOPHAGE) 500 MG tablet TAKE 2 TABLETS BY MOUTH TWICE  DAILY 08/30/21   Babs Sciara, MD  rosuvastatin (CRESTOR) 40 MG tablet TAKE 1 TABLET BY MOUTH DAILY 08/30/21   Babs Sciara, MD      Allergies    Penicillins    Review of Systems   Review of Systems  Neurological:  Positive for dizziness, speech difficulty and headaches.    Physical Exam Updated Vital Signs BP 138/86 (BP Location: Right Arm)   Pulse 63   Temp 97.8 F (36.6 C) (Oral)   Resp 16   Ht 5\' 11"  (1.803 m)   Wt 104.3 kg   SpO2 98%   BMI 32.08 kg/m  Physical Exam Vitals and nursing note reviewed.  Constitutional:      General: He is not in acute distress.    Appearance: Normal appearance. He is well-developed. He is not ill-appearing, toxic-appearing or diaphoretic.  HENT:     Head: Normocephalic and atraumatic.  Eyes:     General:  Lids are normal. Vision grossly intact. Gaze aligned appropriately. No visual field deficit.    Extraocular Movements: Extraocular movements intact.     Conjunctiva/sclera: Conjunctivae normal.     Pupils: Pupils are equal, round, and reactive to light.     Comments: Mild horizontal nystagmus bilaterally  Neck:     Comments: Neck very supple on exam, no meningismus Cardiovascular:     Rate and Rhythm: Normal rate and regular rhythm.     Pulses: Normal pulses.     Heart sounds: Normal heart sounds. No murmur heard. Pulmonary:     Effort: Pulmonary effort is normal. No respiratory distress.     Breath sounds: Normal breath sounds.  Chest:     Chest wall: No tenderness.  Abdominal:     General: Bowel sounds  are normal.     Palpations: Abdomen is soft.     Tenderness: There is no abdominal tenderness.  Musculoskeletal:        General: No swelling.     Cervical back: Neck supple. No rigidity or tenderness.     Right lower leg: No edema.     Left lower leg: No edema.  Skin:    General: Skin is warm and dry.     Capillary Refill: Capillary refill takes less than 2 seconds.     Coloration: Skin is not jaundiced or pale.  Neurological:     Mental Status: He is alert and oriented to person, place, and time. Mental status is at baseline.     GCS: GCS eye subscore is 4. GCS verbal subscore is 5. GCS motor subscore is 6.     Cranial Nerves: No dysarthria or facial asymmetry.     Sensory: No sensory deficit.     Motor: No weakness, tremor, seizure activity or pronator drift.     Coordination: Coordination normal. Finger-Nose-Finger Test and Heel to Shin Test normal.     Gait: Gait normal.  Psychiatric:        Mood and Affect: Mood normal.     ED Results / Procedures / Treatments   Labs (all labs ordered are listed, but only abnormal results are displayed) Labs Reviewed  CBC  COMPREHENSIVE METABOLIC PANEL  CBG MONITORING, ED    EKG EKG Interpretation  Date/Time:  Monday September 25 2021 10:49:17 EDT Ventricular Rate:  73 PR Interval:  130 QRS Duration: 88 QT Interval:  352 QTC Calculation: 387 R Axis:   61 Text Interpretation: Normal sinus rhythm Normal ECG When compared with ECG of 13-Apr-2014 01:04, PREVIOUS ECG IS PRESENT Confirmed by Cathren Laine ([id number]) on 09/25/2021 11:28:39 AM  Radiology CT HEAD WO CONTRAST ( )  Result Date: 09/25/2021 CLINICAL DATA:  Dizziness, headache right arm numbness. EXAM: CT HEAD WITHOUT CONTRAST TECHNIQUE: Contiguous axial images were obtained from the base of the skull through the vertex without intravenous contrast. RADIATION DOSE REDUCTION: This exam was performed according to the departmental dose-optimization program which includes automated  exposure control, adjustment of the mA and/or kV according to patient size and/or use of iterative reconstruction technique. COMPARISON:  Head CT April 13, 2014 FINDINGS: Brain: No evidence of acute infarction, hemorrhage, hydrocephalus, extra-axial collection or mass lesion/mass effect. Vascular: No hyperdense vessel or unexpected calcification. Skull: Normal. Negative for fracture or focal lesion. Sinuses/Orbits: Visualized portions of the paranasal sinuses are predominantly clear. Orbits are unremarkable. Other: Mastoid air cells are predominantly clear. IMPRESSION: No acute intracranial abnormality. Electronically Signed   By: Maudry Mayhew M.D.   On: 09/25/2021 13:16  Procedures Procedures    Medications Ordered in ED Medications  aspirin chewable tablet 81 mg (has no administration in time range)  clopidogrel (PLAVIX) tablet 300 mg (has no administration in time range)    ED Course/ Medical Decision Making/ A&P                           Medical Decision Making Amount and/or Complexity of Data Reviewed External Data Reviewed: notes. Labs: ordered. Decision-making details documented in ED Course. Radiology: ordered and independent interpretation performed. Decision-making details documented in ED Course. ECG/medicine tests: ordered and independent interpretation performed. Decision-making details documented in ED Course.  Risk OTC drugs. Prescription drug management. Decision regarding hospitalization.   41 y.o. male presents to the ED for concern of Dizziness     This involves an extensive number of treatment options, and is a complaint that carries with it a high risk of complications and morbidity.  The emergent differential diagnosis prior to evaluation includes, but is not limited to: TIA, CVA, migraine  This is not an exhaustive differential.   Past Medical History / Co-morbidities / Social History: Hx of HTN, hypertriglyceridemia, fatty liver, DMT2, intermittent  tobacco use, depression.   Social Determinants of Health include daily tobacco use, for which cessation counseling was provided  Additional History:  Internal and external records from outside source obtained and reviewed including family medicine  Physical Exam: Physical exam performed. The pertinent findings include: Unremarkable overall exam findings, unremarkable neuro exam  Lab Tests: I ordered, and personally interpreted labs.  The pertinent results include:   CBC: Unremarkable CMP/BMP: Unremarkable CBG: 97  I personally ordered and interpreted EKG 12 lead findings, which showed: Normal sinus rhythm  Imaging Studies: I ordered imaging studies including CT head.  I independently visualized and interpreted said imaging.  Pertinent results include: Negative for acute intracranial pathology I agree with the radiologist interpretation.  ED Course: Pt well-appearing on exam.  Presenting today for new onset of neurological symptoms.  These include unprovoked onset of headache, dizziness, ataxia, aphasia, and right-sided arm numbness.  Symptoms resolved without intervention in the waiting room of the ED.  Neuro exam and overall exam unremarkable as described above.  Significant Hx of HTN, OSA, hypertriglyceridemia, chronic tobacco use, diabetes mellitus type 2, and obesity.  No significant Hx of headaches/migraines.  Concerned of multiple CVA risk factors.  Based on today's history and presentation, suspicious of TIA.  Consultation for hospitalist placed.  1933: I consulted with Dr. Thomes Dinning of Hospitalist team, and discussed lab and imaging findings as well as pertinent plan - they recommend: teleneurology evaluation of patient and likely admission.  Likely plan for MRI in the morning.  He requested update from teleneurology's recommendation post evaluation.  Consult for teleneurology placed.  2013: I consulted with Dr. Felix Ahmadi of Teleneurology after his evaluation of the patient.  We  discussed lab and imaging findings as well as pertinent plan - they recommend: hospital admission for TIA work-up.  Requested starting the patient on 1 bASA and a 300 mg loading dose of Plavix, then Plavix at 75 mg daily following that.  Plans to follow-up with the patient tomorrow morning.    2020: Ordered aspirin and Plavix, and updated Dr. Thomes Dinning via secure chat.  Disposition: After consideration of the diagnostic results and the patient's encounter today, I feel that the patient would benefit from hospital admission for TIA workup.  Discussed course of treatment thoroughly with the patient, whom  demonstrated understanding.  Patient in agreement and has no further questions.  I have reviewed the patients home medicines and have made adjustments as needed.  I discussed this case with my attending, Dr. Durwin Nora, who agreed with the proposed treatment course and cosigned this note including patient's presenting symptoms, physical exam, and planned diagnostics and interventions.  Attending physician stated agreement with plan or made changes to plan which were implemented.     This chart was dictated using voice recognition software.  Despite best efforts to proofread, errors can occur which can change the documentation meaning.         Final Clinical Impression(s) / ED Diagnoses Final diagnoses:  Dizziness  Right arm numbness  Aphasia  Ataxia  Hypertriglyceridemia  Obesity (BMI 30.0-34.9)  Tobacco abuse  Type 2 diabetes mellitus without complication, without long-term current use of insulin (HCC)  Fatty liver  Hypertension, essential  Obstructive sleep apnea    Rx / DC Orders ED Discharge Orders     None         Sandrea Hammond 09/25/21 2035    Gloris Manchester, MD 09/26/21 [phone]

## 2021-09-25 NOTE — Consult Note (Signed)
[facility] Tele[facility]urology Consult Services  Stat Consult  Patient Name:   Blake [name], Blake [name] Date of Birth:   17-Apr-1980 Identification Number:   MRN - [id number] Date of Service:   09/25/2021 19:46:16  Diagnosis:       G45.9 - Transient cerebral ischemic attack, unspecified  Impression 41 yo man with multiple stroke risk factors presenting for right arm numbness, difficulty walking, headache. Symptoms have resolved and NIHSS is zero. I believe his ABCD2 score is 4. I think he will need admission for TIA workup. Place on aspirin and plavix with 300mg  plavix load. He may benefit from brain MRI, CTA H/N, echo. Place on telemetry. Place on q4h neurochecks. Check a1c, lipid panel.   Recommendations: Our recommendations are outlined below.  Diagnostic Studies : MRI Head without contrastCTA head and neck with contrast  Laboratory Studies : Recommend Lipid panelHemoglobin A1c  Antithrombotic Medications : Bolus with Clopidogrel 300 mg bolus x1 and initiate dual antiplatelet therapy with Aspirin 81 mg daily and Clopidogrel 75 mg daily  Nursing Recommendations : Telemetry, IV Fluids, avoid dextrose containing fluids, Maintain euglycemiaNeuro checks q4 hrs x 24 hrs and then per shiftHead of bed 30 degrees  Consultations : Recommend Speech therapy if failed dysphagia screenPhysical therapy/Occupational therapy  DVT Prophylaxis : Lovenox or LMW Heparin  Disposition : Neurology will follow  ----------------------------------------------------------------------------------------------------    Metrics: [facility] Notification Time: 09/25/2021 19:42:59 Stamp Time: 09/25/2021 19:46:16 Callback Response Time: 09/25/2021 19:51:24     ----------------------------------------------------------------------------------------------------  Chief Complaint: right numbness, difficulty walking  History of Present Illness: Patient is a 41 year old Male. This is a 41 yo man who  presented for evaluation of right numbness and difficulty walking.  He was sitting at his desk and then when he stood up, he felt very dizzy, by which he means he was lightheaded. He then developed a headache and right arm numbness. When he walked, he felt he was lurching to the side. He called his boss to tell her that he wasn't feeling well and felt like he was very slow but was able to speak normally. Denied any frank weakness or speech deficits.  He says he's had an MRI before that showed a "bone spur" causing his arm to go numb.  He recently quit smoking cigarettes in the last two weeks.   Past Medical History:      Hypertension      Diabetes Mellitus      Hyperlipidemia  Medications:  No Anticoagulant use  No Antiplatelet use Reviewed EMR for current medications  Allergies:  Reviewed  Social History: Smoking: Yes  Family History:  There is no family history of premature cerebrovascular disease pertinent to this consultation  ROS : 14 Points Review of Systems was performed and was negative except mentioned in HPI.  Past Surgical History: There Is No Surgical History Contributory To Today's Visit   Examination: BP(138/86), Pulse(70), 1A: Level of Consciousness - Alert; keenly responsive + 0 1B: Ask Month and Age - Both Questions Right + 0 1C: Blink Eyes & Squeeze Hands - Performs Both Tasks + 0 2: Test Horizontal Extraocular Movements - Normal + 0 3: Test Visual Fields - No Visual Loss + 0 4: Test Facial Palsy (Use Grimace if Obtunded) - Normal symmetry + 0 5A: Test Left Arm Motor Drift - No Drift for 10 Seconds + 0 5B: Test Right Arm Motor Drift - No Drift for 10 Seconds + 0 6A: Test Left Leg Motor Drift - No Drift for 5 Seconds +  0 6B: Test Right Leg Motor Drift - No Drift for 5 Seconds + 0 7: Test Limb Ataxia (FNF/Heel-Shin) - No Ataxia + 0 8: Test Sensation - Normal; No sensory loss + 0 9: Test Language/Aphasia - Normal; No aphasia + 0 10: Test Dysarthria -  Normal + 0 11: Test Extinction/Inattention - No abnormality + 0  NIHSS Score: 0     Patient / Family was informed the Neurology Consult would occur via TeleHealth consult by way of interactive audio and video telecommunications and consented to receiving care in this manner.  Patient is being evaluated for possible acute neurologic impairment and high probability of imminent or life - threatening deterioration.I spent total of 35 minutes providing care to this patient, including time for face to face visit via telemedicine, review of medical records, imaging studies and discussion of findings with providers, the patient and / or family.   Dr Parthenia Ames   [facility] [phone]  Case [id number]

## 2021-09-25 NOTE — ED Triage Notes (Signed)
Patient presents to ED with complaints of dizziness, feeling off balance, headache, and right arm numbness started at 0930. Pt felt completely normal at 0700 when he woke up. Dr Denton Lank in triage to evaluate patient.

## 2021-09-25 NOTE — ED Notes (Addendum)
Patient ambulatory approximately 100 feet to the room. Patient given water and crackers at this time.

## 2021-09-25 NOTE — ED Provider Notes (Signed)
[facility] EMERGENCY DEPARTMENT Provider Note   CSN: [id number] Arrival date & time: 09/25/21  1027     History  Chief Complaint  Patient presents with   Dizziness    Blake [name] is a 41 y.o. male.  Patient c/o feeling lightheaded this AM. States had gotten up from sitting position and felt lightheaded. No syncope. No associated chest pain or discomfort. No sob. No palpitations. States was concerned his bp was high, hx same - did take his normal meds today. Denies blood loss, rectal bleeding or melena. No fever or chills. States also felt as if foggy  in head or confused - states his speech sounded normal, and could express self normally, but just felt as if his thinking was slower than normal. No one side of body numbness/weakness, although states right lateral aspect of forearm and fingers felt tingly. Notes hx cervical ddd and similar symptoms in past. No trouble w vision. No falls, able to walk. No room spinning, vertigo, hearing loss or tinnitus.   The history is provided by the patient and medical records.  Dizziness Associated symptoms: no chest pain, no headaches, no palpitations, no shortness of breath and no vomiting        Home Medications Prior to Admission medications   Medication Sig Start Date End Date Taking? Authorizing Provider  varenicline (CHANTIX CONTINUING MONTH PAK) 1 MG tablet Take 1 tablet by mouth Twice a day 09/13/21   Babs Sciara, MD  Varenicline Tartrate, Starter, (CHANTIX STARTING MONTH PAK) 0.5 MG X 11 & 1 MG X 42 TBPK Take 1 tablet by mouth once daily 09/13/21   Babs Sciara, MD  empagliflozin (JARDIANCE) 25 MG TABS tablet TAKE 1 TABLET BY MOUTH DAILY  BEFORE BREAKFAST 07/24/21   Babs Sciara, MD  fenofibrate 160 MG tablet Take 1 tablet (160 mg total) by mouth daily. 08/03/20   Babs Sciara, MD  glucose blood test strip Test glucose once a day DX: E11.9 02/24/19   Babs Sciara, MD  influenza vac split quadrivalent PF (FLUARIX) 0.5 ML  injection Inject into the muscle. 01/23/21   Judyann Munson, MD  lamoTRIgine (LAMICTAL) 100 MG tablet Take one half tablet 50 mg  twice daily. 07/03/21   Joan Flores, NP  lisinopril (ZESTRIL) 20 MG tablet Take 1 tablet (20 mg total) by mouth daily. 03/23/21   Babs Sciara, MD  metFORMIN (GLUCOPHAGE) 500 MG tablet TAKE 2 TABLETS BY MOUTH TWICE  DAILY 08/30/21   Babs Sciara, MD  rosuvastatin (CRESTOR) 40 MG tablet TAKE 1 TABLET BY MOUTH DAILY 08/30/21   Babs Sciara, MD      Allergies    Penicillins    Review of Systems   Review of Systems  Constitutional:  Negative for fever.  HENT:  Negative for sore throat and trouble swallowing.   Eyes:  Negative for pain and visual disturbance.  Respiratory:  Negative for cough and shortness of breath.   Cardiovascular:  Negative for chest pain and palpitations.  Gastrointestinal:  Negative for abdominal pain and vomiting.  Genitourinary:  Negative for dysuria and flank pain.  Musculoskeletal:  Negative for back pain and neck pain.  Skin:  Negative for rash.  Neurological:  Positive for light-headedness. Negative for dizziness, speech difficulty and headaches.  Hematological:  Does not bruise/bleed easily.  Psychiatric/Behavioral:  Negative for confusion.     Physical Exam Updated Vital Signs BP (!) 150/91 (BP Location: Left Arm)   Pulse 71  Temp 97.8 F (36.6 C) (Oral)   Resp 18   Ht 1.803 m (5\' 11" )   Wt 104.3 kg   SpO2 98%   BMI 32.08 kg/m  Physical Exam Vitals and nursing note reviewed.  Constitutional:      Appearance: Normal appearance. He is well-developed.  HENT:     Head: Atraumatic.     Nose: Nose normal.     Mouth/Throat:     Mouth: Mucous membranes are moist.  Eyes:     General: No scleral icterus.    Conjunctiva/sclera: Conjunctivae normal.     Pupils: Pupils are equal, round, and reactive to light.  Neck:     Trachea: No tracheal deviation.  Cardiovascular:     Rate and Rhythm: Normal rate.      Pulses: Normal pulses.  Pulmonary:     Effort: Pulmonary effort is normal. No accessory muscle usage or respiratory distress.  Abdominal:     General: There is no distension.  Genitourinary:    Comments: No cva tenderness. Musculoskeletal:        General: No swelling.     Cervical back: Normal range of motion and neck supple. No rigidity.  Skin:    General: Skin is warm and dry.     Findings: No rash.  Neurological:     Mental Status: He is alert.     Comments: Alert, speech clear. GCS 15. Motor fxn intact bil, stre 5/5, no pronator drift. Sens grossly intact. Steady gait, no ataxia.   Psychiatric:        Mood and Affect: Mood normal.     ED Results / Procedures / Treatments   Labs (all labs ordered are listed, but only abnormal results are displayed) Results for orders placed or performed during the hospital encounter of 09/25/21  CBC  Result Value Ref Range   WBC 8.3 4.0 - 10.5 K/uL   RBC 5.11 4.22 - 5.81 MIL/uL   Hemoglobin 15.5 13.0 - 17.0 g/dL   HCT 09/27/21 24.2 - 68.3 %   MCV 90.0 80.0 - 100.0 fL   MCH 30.3 26.0 - 34.0 pg   MCHC 33.7 30.0 - 36.0 g/dL   RDW 41.9 62.2 - 29.7 %   Platelets 176 150 - 400 K/uL   nRBC 0.0 0.0 - 0.2 %  Comprehensive metabolic panel  Result Value Ref Range   Sodium 138 135 - 145 mmol/L   Potassium 4.0 3.5 - 5.1 mmol/L   Chloride 104 98 - 111 mmol/L   CO2 25 22 - 32 mmol/L   Glucose, Bld 90 70 - 99 mg/dL   BUN 14 6 - 20 mg/dL   Creatinine, Ser 98.9 0.61 - 1.24 mg/dL   Calcium 9.0 8.9 - 2.11 mg/dL   Total Protein 7.3 6.5 - 8.1 g/dL   Albumin 4.4 3.5 - 5.0 g/dL   AST 28 15 - 41 U/L   ALT 36 0 - 44 U/L   Alkaline Phosphatase 57 38 - 126 U/L   Total Bilirubin 1.0 0.3 - 1.2 mg/dL   GFR, Estimated 94.1 >74 mL/min   Anion gap 9 5 - 15  CBG monitoring, ED  Result Value Ref Range   Glucose-Capillary 97 70 - 99 mg/dL     EKG EKG Interpretation  Date/Time:  Monday September 25 2021 10:49:17 EDT Ventricular Rate:  73 PR Interval:  130 QRS  Duration: 88 QT Interval:  352 QTC Calculation: 387 R Axis:   61 Text Interpretation: Normal sinus rhythm Normal  ECG When compared with ECG of 13-Apr-2014 01:04, PREVIOUS ECG IS PRESENT Confirmed by Cathren Laine ([id number]) on 09/25/2021 11:28:39 AM  Radiology CT HEAD WO CONTRAST ( )  Result Date: 09/25/2021 CLINICAL DATA:  Dizziness, headache right arm numbness. EXAM: CT HEAD WITHOUT CONTRAST TECHNIQUE: Contiguous axial images were obtained from the base of the skull through the vertex without intravenous contrast. RADIATION DOSE REDUCTION: This exam was performed according to the departmental dose-optimization program which includes automated exposure control, adjustment of the mA and/or kV according to patient size and/or use of iterative reconstruction technique. COMPARISON:  Head CT April 13, 2014 FINDINGS: Brain: No evidence of acute infarction, hemorrhage, hydrocephalus, extra-axial collection or mass lesion/mass effect. Vascular: No hyperdense vessel or unexpected calcification. Skull: Normal. Negative for fracture or focal lesion. Sinuses/Orbits: Visualized portions of the paranasal sinuses are predominantly clear. Orbits are unremarkable. Other: Mastoid air cells are predominantly clear. IMPRESSION: No acute intracranial abnormality. Electronically Signed   By: Maudry Mayhew M.D.   On: 09/25/2021 13:16    Procedures Procedures    Medications Ordered in ED Medications - No data to display  ED Course/ Medical Decision Making/ A&P                           Medical Decision Making Problems Addressed: Lightheadedness: acute illness or injury with systemic symptoms that poses a threat to life or bodily functions Paresthesia: acute illness or injury  Amount and/or Complexity of Data Reviewed External Data Reviewed: notes. Labs: ordered. Decision-making details documented in ED Course. Radiology: ordered and independent interpretation performed. Decision-making details documented in ED  Course. ECG/medicine tests: ordered and independent interpretation performed. Decision-making details documented in ED Course.  Risk Decision regarding hospitalization.  Iv ns. Continuous pulse ox and cardiac monitoring. Labs ordered/sent. Imaging ordered.   Differential dx includes symptomatic anemia, electrolyte abn, cva, etc - disposition decision including potential need for admission considered  - will get labs and imaging and reassess.   Reviewed nursing notes and prior charts for additional history. External reports reviewed.   Cardiac monitor: sinus rhythm, rate 73.  Labs reviewed/interpreted by me - chem normal.  CT reviewed/interpreted by me - no hem.   Po fluids, food, ambulate in hall.   Went to reassess post labs and imaging resulted - pt not in ED room.           Final Clinical Impression(s) / ED Diagnoses Final diagnoses:  None    Rx / DC Orders ED Discharge Orders     None         Cathren Laine, MD 09/25/21 1617

## 2021-09-25 NOTE — ED Notes (Signed)
Teleneuro consult complete.

## 2021-09-25 NOTE — ED Notes (Signed)
Tele neuro occurring at the bedside now

## 2021-09-26 ENCOUNTER — Observation Stay (HOSPITAL_COMMUNITY): Payer: PRIVATE HEALTH INSURANCE

## 2021-09-26 ENCOUNTER — Other Ambulatory Visit (HOSPITAL_COMMUNITY): Payer: Self-pay | Admitting: *Deleted

## 2021-09-26 ENCOUNTER — Observation Stay (HOSPITAL_BASED_OUTPATIENT_CLINIC_OR_DEPARTMENT_OTHER): Payer: PRIVATE HEALTH INSURANCE

## 2021-09-26 DIAGNOSIS — G459 Transient cerebral ischemic attack, unspecified: Secondary | ICD-10-CM

## 2021-09-26 DIAGNOSIS — F320 Major depressive disorder, single episode, mild: Secondary | ICD-10-CM

## 2021-09-26 LAB — COMPREHENSIVE METABOLIC PANEL
ALT: 28 U/L (ref 0–44)
AST: 18 U/L (ref 15–41)
Albumin: 3.7 g/dL (ref 3.5–5.0)
Alkaline Phosphatase: 52 U/L (ref 38–126)
Anion gap: 6 (ref 5–15)
BUN: 12 mg/dL (ref 6–20)
CO2: 25 mmol/L (ref 22–32)
Calcium: 8.6 mg/dL — ABNORMAL LOW (ref 8.9–10.3)
Chloride: 107 mmol/L (ref 98–111)
Creatinine, Ser: 0.79 mg/dL (ref 0.61–1.24)
GFR, Estimated: >60 mL/min (ref >60)
Glucose, Bld: 116 mg/dL — ABNORMAL HIGH (ref 70–99)
Potassium: 3.7 mmol/L (ref 3.5–5.1)
Sodium: 138 mmol/L (ref 135–145)
Total Bilirubin: 0.5 mg/dL (ref 0.3–1.2)
Total Protein: 6.2 g/dL — ABNORMAL LOW (ref 6.5–8.1)

## 2021-09-26 LAB — CBC
HCT: 44.2 % (ref 39.0–52.0)
Hemoglobin: 15.3 g/dL (ref 13.0–17.0)
MCH: 31 pg (ref 26.0–34.0)
MCHC: 34.6 g/dL (ref 30.0–36.0)
MCV: 89.5 fL (ref 80.0–100.0)
Platelets: 173 10*3/uL (ref 150–400)
RBC: 4.94 MIL/uL (ref 4.22–5.81)
RDW: 14.1 % (ref 11.5–15.5)
WBC: 6.8 10*3/uL (ref 4.0–10.5)
nRBC: 0 % (ref 0.0–0.2)

## 2021-09-26 LAB — HEMOGLOBIN A1C
Hgb A1c MFr Bld: 6 % — ABNORMAL HIGH (ref 4.8–5.6)
Mean Plasma Glucose: 125.5 mg/dL

## 2021-09-26 LAB — LIPID PANEL
Cholesterol: 106 mg/dL (ref 0–200)
HDL: 30 mg/dL — ABNORMAL LOW (ref >40)
LDL Cholesterol: 0 mg/dL (ref 0–99)
Total CHOL/HDL Ratio: 3.5 RATIO
Triglycerides: 378 mg/dL — ABNORMAL HIGH (ref <150)
VLDL: 76 mg/dL — ABNORMAL HIGH (ref 0–40)

## 2021-09-26 LAB — ECHOCARDIOGRAM COMPLETE
Area-P 1/2: 3.91 cm2
Height: 71 in
S' Lateral: 2.9 cm
Weight: 3790.4 oz

## 2021-09-26 LAB — GLUCOSE, CAPILLARY
Glucose-Capillary: 94 mg/dL (ref 70–99)
Glucose-Capillary: 102 mg/dL — ABNORMAL HIGH (ref 70–99)

## 2021-09-26 LAB — PHOSPHORUS: Phosphorus: 4.3 mg/dL (ref 2.5–4.6)

## 2021-09-26 LAB — MAGNESIUM: Magnesium: 2 mg/dL (ref 1.7–2.4)

## 2021-09-26 LAB — HIV ANTIBODY (ROUTINE TESTING W REFLEX): HIV Screen 4th Generation wRfx: NONREACTIVE

## 2021-09-26 MED ORDER — ASPIRIN 81 MG PO TBEC: 81.0000 mg | DELAYED_RELEASE_TABLET | Freq: Every day | ORAL | 2 refills | Status: AC

## 2021-09-26 MED ORDER — CLOPIDOGREL BISULFATE 75 MG PO TABS: 75.0000 mg | ORAL_TABLET | Freq: Every day | ORAL | 0 refills | Status: DC

## 2021-09-26 NOTE — Evaluation (Signed)
Physical Therapy Evaluation Patient Details Name: Blake [name] MRN: [id number] DOB: 1980/11/06 Today's Date: 09/26/2021  History of Present Illness  Blake [name] is a 41 y.o. male with medical history significant of hypertension, hypertriglyceridemia, T2DM and intermittent tobacco use who presents to the emergency department due to numbness sensation from the right elbow to the fingers which occurred around 9:30 AM this morning while at work, he stood up and felt dizzy with a sensation of imbalance, on attempt to walk, he felt like he was staggering like a drunk, he also had trouble in finding words and was talking slowly, patient also complained of headache.  But he denies slurred speech, drooling, chest pain, shortness of breath, nausea, vomiting, facial asymmetry.  Patient works in Consulting civil engineer with computers.  He decided to go to ED for further evaluation and management.  While in the ED, symptoms resolved without taking any medication or any intervention.   Clinical Impression  Patient presents sitting in chair and consents to PT/OT co-evaluation. Patient WNL with neurological testing with no significant findings. Patient is independent with transfers demonstrating appropriate strength and coordination to perform tasks. Ambulated 200 feet independently in hallway with gait pattern WNL. Appropriate balance and coordination observed. Patient able to ambulate declined and inclined surfaces indicating good return for home. Patient tolerated sitting up in chair after therapy with nursing staff notified of mobility status. Patient discharged to care of nursing for ambulation daily as tolerated for length of stay.      Recommendations for follow up therapy are one component of a multi-disciplinary discharge planning process, led by the attending physician.  Recommendations may be updated based on patient status, additional functional criteria and insurance authorization.  Follow Up Recommendations No PT  follow up    Assistance Recommended at Discharge PRN  Patient can return home with the following  A little help with walking and/or transfers;Help with stairs or ramp for entrance    Equipment Recommendations None recommended by PT  Recommendations for Other Services       Functional Status Assessment Patient has not had a recent decline in their functional status     Precautions / Restrictions Precautions Precautions: None Restrictions Weight Bearing Restrictions: No      Mobility  Bed Mobility                    Transfers Overall transfer level: Independent Equipment used: None               General transfer comment: Independent with STS with appropriate strength and coordination    Ambulation/Gait Ambulation/Gait assistance: Independent Gait Distance (Feet): 200 Feet Assistive device: None Gait Pattern/deviations: WFL(Within Functional Limits), Step-through pattern, Decreased stride length Gait velocity: normal     General Gait Details: Ambulated 200 feet independently with gait pattern WNL. Appropriate coordination and balance with mild gait deviations.  Stairs            Wheelchair Mobility    Modified Rankin (Stroke Patients Only)       Balance Overall balance assessment: Independent                                           Pertinent Vitals/Pain Pain Assessment Pain Assessment: No/denies pain    Home Living Family/patient expects to be discharged to:: Private residence Living Arrangements: Children;Spouse/significant other Available Help at Discharge: Family Type of  Home: House Home Access: Stairs to enter Entrance Stairs-Rails: Can reach both Entrance Stairs-Number of Steps: 3 Alternate Level Stairs-Number of Steps: 15 to 20 Home Layout: Multi-level Home Equipment: None      Prior Function Prior Level of Function : Independent/Modified Independent             Mobility Comments: community  ambulator without AD; drives ADLs Comments: independent     Hand Dominance   Dominant Hand: Right    Extremity/Trunk Assessment   Upper Extremity Assessment Upper Extremity Assessment: Defer to OT evaluation    Lower Extremity Assessment Lower Extremity Assessment: Overall WFL for tasks assessed    Cervical / Trunk Assessment Cervical / Trunk Assessment: Normal  Communication   Communication: No difficulties  Cognition Arousal/Alertness: Awake/alert Behavior During Therapy: WFL for tasks assessed/performed Overall Cognitive Status: Within Functional Limits for tasks assessed                                          General Comments      Exercises     Assessment/Plan    PT Assessment Patient does not need any further PT services  PT Problem List         PT Treatment Interventions      PT Goals (Current goals can be found in the Care Plan section)  Acute Rehab PT Goals Patient Stated Goal: return home PT Goal Formulation: With patient Time For Goal Achievement: 09/26/21 Potential to Achieve Goals: Good    Frequency       Co-evaluation PT/OT/SLP Co-Evaluation/Treatment: Yes Reason for Co-Treatment: To address functional/ADL transfers PT goals addressed during session: Mobility/safety with mobility;Balance;Strengthening/ROM OT goals addressed during session: ADL's and self-care       AM-PAC PT "6 Clicks" Mobility  Outcome Measure Help needed turning from your back to your side while in a flat bed without using bedrails?: None Help needed moving from lying on your back to sitting on the side of a flat bed without using bedrails?: None Help needed moving to and from a bed to a chair (including a wheelchair)?: None Help needed standing up from a chair using your arms (e.g., wheelchair or bedside chair)?: None Help needed to walk in hospital room?: None Help needed climbing 3-5 steps with a railing? : A Little 6 Click Score: 23    End  of Session   Activity Tolerance: Patient tolerated treatment well;No increased pain Patient left: in chair;with call bell/phone within reach Nurse Communication: Mobility status PT Visit Diagnosis: Unsteadiness on feet (R26.81);Other abnormalities of gait and mobility (R26.89);Muscle weakness (generalized) (M62.81)    Time: 2355-7322 PT Time Calculation (min) (ACUTE ONLY): 10 min   Charges:   PT Evaluation $PT Eval Low Complexity: 1 Low PT Treatments $Therapeutic Activity: 8-22 mins        11:28 AM, 09/26/21 Arther Abbott, S/PT

## 2021-09-26 NOTE — Discharge Summary (Addendum)
Physician Discharge Summary  Blake [name]:[id number] DOB: 03-15-1981 DOA: 09/25/2021  PCP: Babs Sciara, MD  Admit date: 09/25/2021  Discharge date: 09/26/2021  Admitted From:Home  Disposition:  Home  Recommendations for Outpatient Follow-up:  Follow up with PCP in 1-2 weeks Follow-up with neurosurgery as scheduled and review MRI C-spine results Follow-up with neurology outpatient to evaluate for carpal tunnel syndrome Remain on aspirin daily and Plavix for 3 weeks as prescribed Continue on statin  Home Health: None  Equipment/Devices: None  Discharge Condition:Stable  CODE STATUS: Full  Diet recommendation: Heart Healthy/carb modified  Brief/Interim Summary:  Blake [name] is a 41 y.o. male with medical history significant of hypertension, hypertriglyceridemia, T2DM and intermittent tobacco use who presents to the emergency department due to numbness sensation from the right elbow to the fingers which occurred around 9:30 AM this morning while at work, he stood up and felt dizzy with a sensation of imbalance, on attempt to walk, he felt like he was staggering like a drunk, he also had trouble in finding words and was talking slowly, patient also complained of headache.  He was admitted for suspicion of TIA and he still has multiple risk factors are concerning for posterior circulation TIA as well as radiculopathy/carpal tunnel syndrome.  He has been assessed by neurology and has had imaging studies with no significant findings noted.  MRI C-spine with some left and right-sided lower C-spine foraminal stenosis and will be reviewed by neurosurgery evaluation that is scheduled for this upcoming week.  No other acute events noted and his symptoms have completely resolved with no further recommendations by PT or OT.  Aspirin and Plavix recommendations as well as continuation of statin as noted above per neurology.  Discharge Diagnoses:  Principal Problem:   Transient  ischemic attack (TIA) Active Problems:   Hypertriglyceridemia   Obesity (BMI 30.0-34.9)   Depression, major, single episode, mild (HCC)   Type 2 diabetes mellitus (HCC)   Essential hypertension  Principal discharge diagnosis: Transient neurological deficits concerning for posterior circulation TIA versus radiculopathy/carpal tunnel.  Discharge Instructions  Discharge Instructions     Diet - low sodium heart healthy   Complete by: As directed    Increase activity slowly   Complete by: As directed       Allergies as of 09/26/2021       Reactions   Penicillins Other (See Comments)   Unknown childhood reaction        Medication List     TAKE these medications    aspirin EC 81 MG tablet Take 1 tablet (81 mg total) by mouth daily. Swallow whole.   Chantix Starting Month Pak 0.5 MG X 11 & 1 MG X 42 Tbpk Generic drug: Varenicline Tartrate (Starter) Take 1 tablet by mouth once daily What changed: Another medication with the same name was changed. Make sure you understand how and when to take each.   varenicline 1 MG tablet Commonly known as: Chantix Continuing Month Pak Take 1 tablet by mouth Twice a day What changed:  how much to take how to take this when to take this additional instructions   clopidogrel 75 MG tablet Commonly known as: PLAVIX Take 1 tablet (75 mg total) by mouth daily for 21 days. Start taking on: September 27, 2021   glucose blood test strip Test glucose once a day DX: E11.9   Jardiance 25 MG Tabs tablet Generic drug: empagliflozin TAKE 1 TABLET BY MOUTH DAILY  BEFORE BREAKFAST   lamoTRIgine  100 MG tablet Commonly known as: LaMICtal Take one half tablet 50 mg  twice daily. What changed:  how much to take how to take this when to take this   lisinopril 20 MG tablet Commonly known as: ZESTRIL Take 1 tablet (20 mg total) by mouth daily.   metFORMIN 500 MG tablet Commonly known as: GLUCOPHAGE TAKE 2 TABLETS BY MOUTH TWICE  DAILY    rosuvastatin 40 MG tablet Commonly known as: CRESTOR TAKE 1 TABLET BY MOUTH DAILY        Follow-up Information     Babs Sciara, MD. Schedule an appointment as soon as possible for a visit in 1 week(s).   Specialty: Family Medicine Contact information: [address] [place] [address] [phone]                Allergies  Allergen Reactions   Penicillins Other (See Comments)    Unknown childhood reaction    Consultations: Neurology   Procedures/Studies: MR CERVICAL SPINE WO CONTRAST  Result Date: 09/26/2021 CLINICAL DATA:  Cervical radiculopathy EXAM: MRI CERVICAL SPINE WITHOUT CONTRAST TECHNIQUE: Multiplanar, multisequence MR imaging of the cervical spine was performed. No intravenous contrast was administered. COMPARISON:  CTA neck dated 1 day prior FINDINGS: Alignment: There is straightening of the normal cervical lordosis. There is no antero or retrolisthesis. Vertebrae: Vertebral body heights are preserved. Background marrow signal is normal. There is no suspicious marrow signal abnormality or marrow edema. Cord: Normal in signal and morphology. Posterior Fossa, vertebral arteries, paraspinal tissues: The imaged posterior fossa is unremarkable. The vertebral artery flow voids are normal. The paraspinal soft tissues are unremarkable. Disc levels: C2-C3: No significant spinal canal or neural foraminal stenosis C3-C4: No significant spinal canal or neural foraminal stenosis C4-C5: There is right worse than left uncovertebral ridging resulting in mild right and no significant left neural foraminal stenosis and no significant spinal canal stenosis C5-C6: There is a prominent left paracentral endplate spur and probable disc protrusion and mild uncovertebral ridging resulting in narrowing of the left aspect of the thecal sac with slight mass effect on the ventral cord surface and moderate left neural foraminal stenosis with possible irritation of the exiting C6  nerve root. No significant right neural foraminal stenosis C6-C7: There is a prominent left paracentral endplate spur resulting in mild narrowing of the left aspect of the thecal sac with slight flattening of the ventral cord surface and moderate left neural foraminal stenosis with potential irritation of the exiting C7 nerve root. No significant right neural foraminal stenosis. C7-T1: No significant spinal canal or neural foraminal stenosis. IMPRESSION: 1. Mild left neural foraminal stenosis at C5-C6 and moderate left neural foraminal stenosis at C6-C7 due to prominent endplate spurs and a probable disc protrusion component at C5-C6, with potential irritation of the exiting C6 and/or C7 nerve root. There is mild narrowing of the left aspect of the thecal sac at these levels with mass effect on the ventral cord surface, worse at C5-C6, but no cord signal abnormality. 2. Mild right neural foraminal stenosis at C4-C5. Electronically Signed   By: Lesia Hausen M.D.   On: 09/26/2021 15:39   ECHOCARDIOGRAM COMPLETE  Result Date: 09/26/2021    ECHOCARDIOGRAM REPORT   Patient Name:   GAYLON BENTZ Date of Exam: 09/26/2021 Medical Rec #:  [id number]         Height:       71.0 in Accession #:    [id number]  Weight:       236.9 lb Date of Birth:  [birth year]         BSA:          2.266 m Patient Age:    40 years          BP:           134/90 mmHg Patient Gender: M                 HR:           58 bpm. [facility] Procedure: 2D Echo, Cardiac Doppler and Color Doppler Indications:    TIA G45.9  History:        Patient has no prior history of Echocardiogram examinations.                 TIA; Risk Factors:Hypertension, Diabetes, Dyslipidemia and                 Tobacco abuse. Obstructive sleep apnea.  Sonographer:    Celesta Gentile RCS Referring Phys: [id number] OLADAPO ADEFESO IMPRESSIONS  1. Left ventricular ejection fraction, by estimation, is 60 to 65%. The left ventricle has normal function. The left  ventricle has no regional wall motion abnormalities. Left ventricular diastolic parameters were normal.  2. Right ventricular systolic function is normal. The right ventricular size is normal. Tricuspid regurgitation signal is inadequate for assessing PA pressure.  3. The mitral valve is normal in structure. Trivial mitral valve regurgitation.  4. The aortic valve is tricuspid. Aortic valve regurgitation is not visualized. No aortic stenosis is present.  5. The inferior vena cava is dilated in size with >50% respiratory variability, suggesting right atrial pressure of 8 mmHg. Comparison(s): No prior Echocardiogram. Conclusion(s)/Recommendation(s): No intracardiac source of embolism detected on this transthoracic study. Consider a transesophageal echocardiogram to exclude cardiac source of embolism if clinically indicated. FINDINGS  Left Ventricle: Left ventricular ejection fraction, by estimation, is 60 to 65%. The left ventricle has normal function. The left ventricle has no regional wall motion abnormalities. The left ventricular internal cavity size was normal in size. There is  no left ventricular hypertrophy. Left ventricular diastolic parameters were normal. Right Ventricle: The right ventricular size is normal. No increase in right ventricular wall thickness. Right ventricular systolic function is normal. Tricuspid regurgitation signal is inadequate for assessing PA pressure. Left Atrium: Left atrial size was normal in size. Right Atrium: Right atrial size was normal in size. Pericardium: There is no evidence of pericardial effusion. Mitral Valve: The mitral valve is normal in structure. Trivial mitral valve regurgitation. Tricuspid Valve: The tricuspid valve is normal in structure. Tricuspid valve regurgitation is not demonstrated. Aortic Valve: The aortic valve is tricuspid. Aortic valve regurgitation is not visualized. No aortic stenosis is present. Pulmonic Valve: The pulmonic valve was normal in  structure. Pulmonic valve regurgitation is trivial. Aorta: The aortic root is normal in size and structure. Venous: The inferior vena cava is dilated in size with greater than 50% respiratory variability, suggesting right atrial pressure of 8 mmHg. IAS/Shunts: The atrial septum is grossly normal.  LEFT VENTRICLE PLAX 2D LVIDd:         5.20 cm   Diastology LVIDs:         2.90 cm   LV e' medial:    9.79 cm/s LV PW:         0.90 cm   LV E/e' medial:  10.3 LV IVS:  1.00 cm   LV e' lateral:   14.40 cm/s LVOT diam:     2.00 cm   LV E/e' lateral: 7.0 LV SV:         91 LV SV Index:   40 LVOT Area:     3.14 cm  RIGHT VENTRICLE RV S prime:     11.70 cm/s TAPSE (M-mode): 2.1 cm LEFT ATRIUM             Index        RIGHT ATRIUM           Index LA diam:        4.10 cm 1.81 cm/m   RA Area:     18.00 cm LA Vol (A2C):   64.4 ml 28.42 ml/m  RA Volume:   53.10 ml  23.43 ml/m LA Vol (A4C):   67.6 ml 29.83 ml/m LA Biplane Vol: 67.5 ml 29.79 ml/m  AORTIC VALVE LVOT Vmax:   156.00 cm/s LVOT Vmean:  91.400 cm/s LVOT VTI:    0.289 m  AORTA Ao Root diam: 3.00 cm MITRAL VALVE MV Area (PHT): 3.91 cm     SHUNTS MV Decel Time: 194 msec     Systemic VTI:  0.29 m MV E velocity: 101.00 cm/s  Systemic Diam: 2.00 cm MV A velocity: 41.10 cm/s MV E/A ratio:  2.46 Laurance FlattenHeather Pemberton MD Electronically signed by Laurance FlattenHeather Pemberton MD Signature Date/Time: 09/26/2021/2:45:04 PM    Final    MR BRAIN WO CONTRAST  Result Date: 09/26/2021 CLINICAL DATA:  Transient ischemic attack EXAM: MRI HEAD WITHOUT CONTRAST TECHNIQUE: Multiplanar, multiecho pulse sequences of the brain and surrounding structures were obtained without intravenous contrast. COMPARISON:  None Available. FINDINGS: Brain: There is no acute infarction or intracranial hemorrhage. There is no intracranial mass, mass effect, or edema. There is no hydrocephalus or extra-axial fluid collection. Ventricles and sulci are normal in size and configuration. Vascular: Major vessel flow voids  at the skull base are preserved. Skull and upper cervical spine: Normal marrow signal is preserved. Sinuses/Orbits: Paranasal sinuses are aerated. Orbits are unremarkable. Other: Sella is unremarkable.  Mastoid air cells are clear. IMPRESSION: No acute infarction, hemorrhage, or mass. Electronically Signed   By: Guadlupe SpanishPraneil  Patel M.D.   On: 09/26/2021 08:52   CT ANGIO HEAD NECK W WO CM (CODE STROKE)  Result Date: 09/25/2021 CLINICAL DATA:  Dizziness, feeling off balance, headache, right arm numbness EXAM: CT ANGIOGRAPHY HEAD AND NECK TECHNIQUE: Multidetector CT imaging of the head and neck was performed using the standard protocol during bolus administration of intravenous contrast. Multiplanar CT image reconstructions and MIPs were obtained to evaluate the vascular anatomy. Carotid stenosis measurements (when applicable) are obtained utilizing NASCET criteria, using the distal internal carotid diameter as the denominator. RADIATION DOSE REDUCTION: This exam was performed according to the departmental dose-optimization program which includes automated exposure control, adjustment of the mA and/or kV according to patient size and/or use of iterative reconstruction technique. CONTRAST:  75mL OMNIPAQUE IOHEXOL 350 MG/ML SOLN COMPARISON:  No prior CTA, correlation is made with 09/25/2021 CT head FINDINGS: CT HEAD FINDINGS For noncontrast findings, please see same day CT head. CTA NECK FINDINGS Aortic arch: Standard branching. Imaged portion shows no evidence of aneurysm or dissection. No significant stenosis of the major arch vessel origins. Right carotid system: No evidence of dissection, occlusion, or hemodynamically significant stenosis (greater than 50%). Left carotid system: No evidence of dissection, occlusion, or hemodynamically significant stenosis (greater than 50%). Vertebral arteries: Left dominant.  No evidence of dissection, occlusion, or hemodynamically significant stenosis (greater than 50%). Skeleton: No  acute osseous abnormality. Other neck: No acute finding. Upper chest: No focal pulmonary opacity or pleural effusion. Review of the MIP images confirms the above findings CTA HEAD FINDINGS Anterior circulation: Both internal carotid arteries are patent to the termini, without significant stenosis. A1 segments patent. Normal anterior communicating artery. Anterior cerebral arteries are patent to their distal aspects. No M1 stenosis or occlusion. Normal MCA bifurcations. Distal MCA branches perfused and symmetric. Posterior circulation: The right vertebral artery is small and primarily supplies the right PICA, with a diminutive right V4 continuing to the vertebrobasilar junction. The left vertebral artery is patent to the vertebrobasilar junction without stenosis. Posterior inferior cerebellar arteries patent proximally. Basilar patent to its distal aspect. Superior cerebellar arteries patent proximally. Patent P1 segments. PCAs perfused to their distal aspects without stenosis. Possible diminutive posterior communicating arteries bilaterally. Venous sinuses: As permitted by contrast timing, patent. Anatomic variants: None significant. Review of the MIP images confirms the above findings IMPRESSION: 1.  No intracranial large vessel occlusion or significant stenosis. 2.  No hemodynamically significant stenosis in the neck. Electronically Signed   By: Wiliam Ke M.D.   On: 09/25/2021 23:45   CT HEAD WO CONTRAST ( )  Result Date: 09/25/2021 CLINICAL DATA:  Dizziness, headache right arm numbness. EXAM: CT HEAD WITHOUT CONTRAST TECHNIQUE: Contiguous axial images were obtained from the base of the skull through the vertex without intravenous contrast. RADIATION DOSE REDUCTION: This exam was performed according to the departmental dose-optimization program which includes automated exposure control, adjustment of the mA and/or kV according to patient size and/or use of iterative reconstruction technique. COMPARISON:   Head CT April 13, 2014 FINDINGS: Brain: No evidence of acute infarction, hemorrhage, hydrocephalus, extra-axial collection or mass lesion/mass effect. Vascular: No hyperdense vessel or unexpected calcification. Skull: Normal. Negative for fracture or focal lesion. Sinuses/Orbits: Visualized portions of the paranasal sinuses are predominantly clear. Orbits are unremarkable. Other: Mastoid air cells are predominantly clear. IMPRESSION: No acute intracranial abnormality. Electronically Signed   By: Maudry Mayhew M.D.   On: 09/25/2021 13:16     Discharge Exam: Vitals:   09/26/21 1015 09/26/21 1446  BP: 134/90 (!) 148/90  Pulse: 66 61  Resp: 20 20  Temp: 98.1 F (36.7 C) 98.3 F (36.8 C)  SpO2: 99% 99%   Vitals:   09/26/21 0217 09/26/21 0527 09/26/21 1015 09/26/21 1446  BP: 130/81 131/79 134/90 (!) 148/90  Pulse: 60 60 66 61  Resp: 18 16 20 20   Temp: 97.9 F (36.6 C) 97.6 F (36.4 C) 98.1 F (36.7 C) 98.3 F (36.8 C)  TempSrc:   Oral Oral  SpO2: 96% 96% 99% 99%  Weight:      Height:        General: Pt is alert, awake, not in acute distress Cardiovascular: RRR, S1/S2 +, no rubs, no gallops Respiratory: CTA bilaterally, no wheezing, no rhonchi Abdominal: Soft, NT, ND, bowel sounds + Extremities: no edema, no cyanosis    The results of significant diagnostics from this hospitalization (including imaging, microbiology, ancillary and laboratory) are listed below for reference.     Microbiology: No results found for this or any previous visit (from the past 240 hour(s)).   Labs: BNP (last 3 results) No results for input(s): "BNP" in the last 8760 hours. Basic Metabolic Panel: Recent Labs  Lab 09/25/21 1206 09/26/21 0456  NA 138 138  K 4.0 3.7  CL 104 107  CO2 25 25  GLUCOSE 90 116*  BUN 14 12  CREATININE 0.70 0.79  CALCIUM 9.0 8.6*  MG  --  2.0  PHOS  --  4.3   Liver Function Tests: Recent Labs  Lab 09/25/21 1206 09/26/21 0456  AST 28 18  ALT 36 28   ALKPHOS 57 52  BILITOT 1.0 0.5  PROT 7.3 6.2*  ALBUMIN 4.4 3.7   No results for input(s): "LIPASE", "AMYLASE" in the last 168 hours. No results for input(s): "AMMONIA" in the last 168 hours. CBC: Recent Labs  Lab 09/25/21 1206 09/26/21 0456  WBC 8.3 6.8  HGB 15.5 15.3  HCT 46.0 44.2  MCV 90.0 89.5  PLT 176 173   Cardiac Enzymes: No results for input(s): "CKTOTAL", "CKMB", "CKMBINDEX", "TROPONINI" in the last 168 hours. BNP: Invalid input(s): "POCBNP" CBG: Recent Labs  Lab 09/25/21 1047 09/25/21 2201 09/26/21 0718 09/26/21 1113  GLUCAP 97 181* 94 102*   D-Dimer No results for input(s): "DDIMER" in the last 72 hours. Hgb A1c Recent Labs    09/26/21 0456  HGBA1C 6.0*   Lipid Profile Recent Labs    09/26/21 0456  CHOL 106  HDL 30*  LDLCALC 0  TRIG 378*  CHOLHDL 3.5   Thyroid function studies No results for input(s): "TSH", "T4TOTAL", "T3FREE", "THYROIDAB" in the last 72 hours.  Invalid input(s): "FREET3" Anemia work up No results for input(s): "VITAMINB12", "FOLATE", "FERRITIN", "TIBC", "IRON", "RETICCTPCT" in the last 72 hours. Urinalysis    Component Value Date/Time   COLORURINE YELLOW 08/01/2020 1244   APPEARANCEUR CLEAR 08/01/2020 1244   LABSPEC 1.026 08/01/2020 1244   PHURINE 5.0 08/01/2020 1244   GLUCOSEU >=500 (A) 08/01/2020 1244   HGBUR NEGATIVE 08/01/2020 1244   BILIRUBINUR NEGATIVE 08/01/2020 1244   KETONESUR NEGATIVE 08/01/2020 1244   PROTEINUR NEGATIVE 08/01/2020 1244   UROBILINOGEN 0.2 04/30/2008 0532   NITRITE NEGATIVE 08/01/2020 1244   LEUKOCYTESUR NEGATIVE 08/01/2020 1244   Sepsis Labs Recent Labs  Lab 09/25/21 1206 09/26/21 0456  WBC 8.3 6.8   Microbiology No results found for this or any previous visit (from the past 240 hour(s)).   Time coordinating discharge: 35 minutes  SIGNED:   Erick Blinks, DO [facility] 09/26/2021, 3:48 PM  If 7PM-7AM, please contact night-coverage [email or web address]

## 2021-09-26 NOTE — Progress Notes (Signed)
*  PRELIMINARY RESULTS* Echocardiogram 2D Echocardiogram has been performed.  Stacey Drain 09/26/2021, 1:23 PM

## 2021-09-26 NOTE — Progress Notes (Signed)
SLP Cancellation Note  Patient Details Name: MECHEL SCHUTTER MRN: [id number] DOB: 1980/08/26   Cancelled treatment:       Reason Eval/Treat Not Completed: SLP screened, no needs identified, will sign off   Tressie Stalker, M.S., CCC-SLP 09/26/2021, 12:30 PM

## 2021-09-26 NOTE — Plan of Care (Signed)
?  Problem: Education: ?Goal: Knowledge of disease or condition will improve ?Outcome: Progressing ?Goal: Knowledge of secondary prevention will improve (SELECT ALL) ?Outcome: Progressing ?Goal: Knowledge of patient specific risk factors will improve (INDIVIDUALIZE FOR PATIENT) ?Outcome: Progressing ?  ?

## 2021-09-26 NOTE — TOC Progression Note (Signed)
Transition of Care [facility]) - Progression Note    Patient Details  Name: TREYVONE CHELF MRN: [id number] Date of Birth: [birth year]  [facility]) CM/SW Contact  Karn Cassis, [place] Phone Number: 09/26/2021, 8:17 AM  Clinical Narrative:   Transition of Care [facility]) Screening Note   Patient Details  Name: KLINTON CANDELAS Date of Birth: September 13, 1980   Transition of Care [facility]) CM/SW Contact:    Karn Cassis, LCSW Phone Number: 09/26/2021, 8:17 AM    Transition of Care Department [facility]) has reviewed patient and no TOC needs have been identified at this time. We will continue to monitor patient advancement through interdisciplinary progression rounds. If new patient transition needs arise, please place a TOC consult.             Expected Discharge Plan and Services                                                 Social Determinants of Health (SDOH) Interventions    Readmission Risk Interventions     No data to display

## 2021-09-26 NOTE — Evaluation (Signed)
Occupational Therapy Evaluation Patient Details Name: Blake [name] MRN: [id number] DOB: April 07, 1981 Today's Date: 09/26/2021   History of Present Illness Blake [name] is a 41 y.o. male with medical history significant of hypertension, hypertriglyceridemia, T2DM and intermittent tobacco use who presents to the emergency department due to numbness sensation from the right elbow to the fingers which occurred around 9:30 AM this morning while at work, he stood up and felt dizzy with a sensation of imbalance, on attempt to walk, he felt like he was staggering like a drunk, he also had trouble in finding words and was talking slowly, patient also complained of headache.  But he denies slurred speech, drooling, chest pain, shortness of breath, nausea, vomiting, facial asymmetry.  Patient works in Consulting civil engineer with computers.  He decided to go to ED for further evaluation and management.  While in the ED, symptoms resolved without taking any medication or any intervention. (per DO)   Clinical Impression   Pt agreeable to OT and PT co-evaluation. Pt reports that symptoms have resolved. Pt appears to be at or near baseline levels. Good functional mobility and B UE strength with no more reports of numbness or tingling. Pt is not recommended for further acute OT services and will be discharged to care of nursing staff for remaining length of stay.       Recommendations for follow up therapy are one component of a multi-disciplinary discharge planning process, led by the attending physician.  Recommendations may be updated based on patient status, additional functional criteria and insurance authorization.   Follow Up Recommendations  No OT follow up    Assistance Recommended at Discharge None  Patient can return home with the following      Functional Status Assessment  Patient has not had a recent decline in their functional status  Equipment Recommendations  None recommended by OT    Recommendations  for Other Services       Precautions / Restrictions Precautions Precautions: None Restrictions Weight Bearing Restrictions: No      Mobility Bed Mobility                    Transfers Overall transfer level: Independent Equipment used: None               General transfer comment: ambulating from chait to ambulation in hall and back      Balance Overall balance assessment: Independent                                         ADL either performed or assessed with clinical judgement   ADL Overall ADL's : Independent                                       General ADL Comments: Indepdnently donning shoes seated in chair. Independent ambulation.     Vision Baseline Vision/History: 1 Wears glasses Ability to See in Adequate Light: 1 Impaired Patient Visual Report: No change from baseline Vision Assessment?: No apparent visual deficits Additional Comments: other than baseline                Pertinent Vitals/Pain Pain Assessment Pain Assessment: No/denies pain     Hand Dominance Right   Extremity/Trunk Assessment Upper Extremity Assessment Upper Extremity Assessment: Overall WFL for tasks  assessed   Lower Extremity Assessment Lower Extremity Assessment: Defer to PT evaluation   Cervical / Trunk Assessment Cervical / Trunk Assessment: Normal   Communication Communication Communication: No difficulties   Cognition Arousal/Alertness: Awake/alert Behavior During Therapy: WFL for tasks assessed/performed Overall Cognitive Status: Within Functional Limits for tasks assessed                                                        Home Living Family/patient expects to be discharged to:: Private residence Living Arrangements: Children;Spouse/significant other Available Help at Discharge: Family Type of Home: House Home Access: Stairs to enter Secretary/administrator of Steps: 3 Entrance  Stairs-Rails: Can reach both Home Layout: Multi-level Alternate Level Stairs-Number of Steps: 15 to 20 Alternate Level Stairs-Rails: Left (going up; 15 to 20 down to baement as well with R rail going up.) Bathroom Shower/Tub: Walk-in shower;Tub/shower unit   Bathroom Toilet: Standard     Home Equipment: None          Prior Functioning/Environment Prior Level of Function : Independent/Modified Independent             Mobility Comments: community ambulator without AD; drives ADLs Comments: independent                                Co-evaluation PT/OT/SLP Co-Evaluation/Treatment: Yes Reason for Co-Treatment: To address functional/ADL transfers   OT goals addressed during session: ADL's and self-care      AM-PAC OT "6 Clicks" Daily Activity     Outcome Measure Help from another person eating meals?: None Help from another person taking care of personal grooming?: None Help from another person toileting, which includes using toliet, bedpan, or urinal?: None Help from another person bathing (including washing, rinsing, drying)?: None Help from another person to put on and taking off regular upper body clothing?: None Help from another person to put on and taking off regular lower body clothing?: None 6 Click Score: 24   End of Session    Activity Tolerance: Patient tolerated treatment well Patient left: in chair;with call bell/phone within reach  OT Visit Diagnosis: Other symptoms and signs involving the nervous system (R29.898)                Time: 1610-9604 OT Time Calculation (min): 8 min Charges:  OT General Charges $OT Visit: 1 Visit OT Evaluation $OT Eval Low Complexity: 1 Low  Ande Therrell OT, MOT  Danie Chandler 09/26/2021, 9:43 AM

## 2021-09-26 NOTE — Consult Note (Addendum)
I connected with  [name] on 09/26/21 by a video enabled telemedicine application and verified that I am speaking with the correct person using two identifiers.   I discussed the limitations of evaluation and management by telemedicine. The patient expressed understanding and agreed to proceed.   Location of patient: [facility] Location of physician: [facility]   Neurology Consultation Reason for Consult: Headache, dizziness, right-sided numbness Referring Physician: Dr. Maurilio Lovely   CC: Headache, dizziness, right-sided numbness  History is obtained from: Patient, chart review  HPI: Blake [name] is a 41 y.o. male with past medical history of hypertension, hyperlipidemia, type 2 diabetes, depression, sleep apnea, tobacco use disorder who presented with transient headache, dizziness, right-sided numbness.  Patient states he was sitting at his computer and suddenly noticed numbness in the right arm below the elbow involving the thumb, index finger and middle finger.  This was associated with feeling off balance and mild headache, no nausea/vomiting.  Denies any vision changes.  States symptoms lasted for about 2 hours and gradually resolved.  Symptoms have not recurred since.  States he has had occasional headaches but has not had one in about 6 months.  Denies similar focal neurological deficits in past.  States he started using Chantix about a week ago.  Also has made some dietary and lifestyle changes.  Patient also reports paresthesias in bilateral hands predominantly at night, at times while driving, no weakness.  States it has been gradually getting worse.  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  Has a follow-up with neurosurgery scheduled next week  Last known normal 0930 am 09/25/2021 Event happened at home No tPA as NIHSS 0 No thrombectomy as no blood vessel occlusion mRS 0  ROS: All other systems reviewed and negative except as  noted in the HPI.   Past Medical History:  Diagnosis Date   Depression    Elevated ferritin 06/25/2019   High blood cholesterol level    Hypertension    Sleep apnea 01/07/2013   cpap   Tobacco abuse 11/19/2018   Patient working very hard at quitting   Type 2 diabetes mellitus (HCC) 11/19/2018    Family History  Problem Relation Age of Onset   Schizophrenia Father    Alcohol abuse Father    Diabetes Father    Alcohol abuse Maternal Grandfather    Alcohol abuse Paternal Uncle    Sleep apnea Neg Hx     Social History:  reports that he quit smoking about 2 weeks ago. His smoking use included cigarettes. He has a 7.50 pack-year smoking history. He has never used smokeless tobacco. He reports current alcohol use. He reports that he does not use drugs.   Medications Prior to Admission  Medication Sig Dispense Refill Last Dose   empagliflozin (JARDIANCE) 25 MG TABS tablet TAKE 1 TABLET BY MOUTH DAILY  BEFORE BREAKFAST 90 tablet 1 09/25/2021   lamoTRIgine (LAMICTAL) 100 MG tablet Take one half tablet 50 mg  twice daily. (Patient taking differently: Take 50 mg by mouth 2 (two) times daily. Take one half tablet 50 mg  twice daily.) 30 tablet 3 09/25/2021   lisinopril (ZESTRIL) 20 MG tablet Take 1 tablet (20 mg total) by mouth daily. 90 tablet 3 09/25/2021   metFORMIN (GLUCOPHAGE) 500 MG tablet TAKE 2 TABLETS BY MOUTH TWICE  DAILY 360 tablet 1 09/25/2021   rosuvastatin (CRESTOR) 40 MG tablet TAKE 1 TABLET BY MOUTH DAILY 90  tablet 1 09/25/2021   varenicline (CHANTIX CONTINUING MONTH PAK) 1 MG tablet Take 1 tablet by mouth Twice a day (Patient taking differently: Take 1 mg by mouth 2 (two) times daily.) 90 tablet 0 09/25/2021   Varenicline Tartrate, Starter, (CHANTIX STARTING MONTH PAK) 0.5 MG X 11 & 1 MG X 42 TBPK Take 1 tablet by mouth once daily (Patient not taking: Reported on 09/25/2021) 53 each 0 Not Taking   glucose blood test strip Test glucose once a day DX: E11.9 50 each 5        Exam: Current vital signs: BP 134/90 (BP Location: Right Arm)   Pulse 66   Temp 98.1 F (36.7 C) (Oral)   Resp 20   Ht 5\' 11"  (1.803 m)   Wt 107.5 kg   SpO2 99%   BMI 33.04 kg/m  Vital signs in last 24 hours: Temp:  [97.6 F (36.4 C)-98.2 F (36.8 C)] 98.1 F (36.7 C) (06/20 1015) Pulse Rate:  [56-80] 66 (06/20 1015) Resp:  [16-25] 20 (06/20 1015) BP: (130-146)/(74-90) 134/90 (06/20 1015) SpO2:  [96 %-100 %] 99 % (06/20 1015) FiO2 (%):  [21 %] 21 % (06/19 2142) Weight:  [107.5 kg] 107.5 kg (06/19 2144)   Physical Exam  Constitutional: Appears well-developed and well-nourished.  Psych: Affect appropriate to situation Eyes: No scleral injection Neuro: AOx3, no aphasia, cranial nerves II to XII are grossly intact, antigravity strength in all extremities without drift, FTN intact bilaterally   I have reviewed labs in epic and the results pertinent to this consultation are: CBC:  Recent Labs  Lab 09/25/21 1206 09/26/21 0456  WBC 8.3 6.8  HGB 15.5 15.3  HCT 46.0 44.2  MCV 90.0 89.5  PLT 176 173    Basic Metabolic Panel:  Lab Results  Component Value Date   NA 138 09/26/2021   K 3.7 09/26/2021   CO2 25 09/26/2021   GLUCOSE 116 (H) 09/26/2021   BUN 12 09/26/2021   CREATININE 0.79 09/26/2021   CALCIUM 8.6 (L) 09/26/2021   GFRNONAA >60 09/26/2021   GFRAA 126 01/12/2020   Lipid Panel:  Lab Results  Component Value Date   LDLCALC 0 09/26/2021   HgbA1c:  Lab Results  Component Value Date   HGBA1C 6.0 (H) 09/26/2021   Urine Drug Screen: No results found for: "LABOPIA", "COCAINSCRNUR", "LABBENZ", "AMPHETMU", "THCU", "LABBARB"  Alcohol Level No results found for: "ETH"   I have reviewed the images obtained:  CT head without contrast 09/25/2021:No acute intracranial abnormality  CT angio head and neck with and without contrast 09/25/2021:  No intracranial large vessel occlusion or significant stenosis. No hemodynamically significant stenosis in the  neck.  MRI brain without contrast 09/26/2021: No acute infarction, hemorrhage, or mass.  ASSESSMENT/PLAN: 41 year old male presented with transient neurological deficits which have since resolved.  Transient neurological deficits -Transient dizziness is concerning for posterior circulation TIA in a patient with multiple risk factors.  However, the distribution of numbness is more concerning for radiculopathy.  Bilateral hand numbness is most likely suggestive of carpal tunnel syndrome  Recommendations -Recommend aspirin 81 mg daily, Plavix 75 mg daily for 3 weeks -Continue rosuvastatin 40 mg daily -MRI C-spine to look for cervical stenosis, compression -TTE done, report pending -If TTE and MRI C-spine normal, okay for discharge from neurology standpoint -Recommend wrist brace at night for possible carpal tunnel syndrome and follow-up with Dr. 20 (neurologist at [facility]) for further evaluation -Discussed plan with patient and Dr. [facility]  Thank you for allowing [place] to participate in the care of this patient. If you have any further questions, please contact  me or neurohospitalist.   Zeb [facility]

## 2021-09-28 ENCOUNTER — Telehealth: Payer: Self-pay

## 2021-10-05 ENCOUNTER — Ambulatory Visit: Payer: PRIVATE HEALTH INSURANCE | Admitting: Mental Health

## 2021-10-07 ENCOUNTER — Other Ambulatory Visit: Payer: Self-pay | Admitting: Behavioral Health

## 2021-10-07 DIAGNOSIS — R454 Irritability and anger: Secondary | ICD-10-CM

## 2021-10-07 DIAGNOSIS — F39 Unspecified mood [affective] disorder: Secondary | ICD-10-CM

## 2021-10-13 ENCOUNTER — Ambulatory Visit (INDEPENDENT_AMBULATORY_CARE_PROVIDER_SITE_OTHER): Payer: PRIVATE HEALTH INSURANCE | Admitting: Family Medicine

## 2021-10-13 ENCOUNTER — Encounter: Payer: Self-pay | Admitting: Family Medicine

## 2021-10-13 VITALS — BP 126/80 | Wt 234.6 lb

## 2021-10-13 DIAGNOSIS — E785 Hyperlipidemia, unspecified: Secondary | ICD-10-CM

## 2021-10-13 DIAGNOSIS — R7989 Other specified abnormal findings of blood chemistry: Secondary | ICD-10-CM

## 2021-10-13 DIAGNOSIS — K760 Fatty (change of) liver, not elsewhere classified: Secondary | ICD-10-CM | POA: Diagnosis not present

## 2021-10-13 DIAGNOSIS — E119 Type 2 diabetes mellitus without complications: Secondary | ICD-10-CM

## 2021-10-13 DIAGNOSIS — Z79899 Other long term (current) drug therapy: Secondary | ICD-10-CM

## 2021-10-13 DIAGNOSIS — E1169 Type 2 diabetes mellitus with other specified complication: Secondary | ICD-10-CM

## 2021-10-13 NOTE — Progress Notes (Signed)
   Subjective:    Patient ID: Blake [name], male    DOB: July 12, 1980, 41 y.o.   MRN: [id number]  HPI Pt arrives today for hospital follow up. Pt went to [facility] ER on 09/25/21 for stroke symptoms (dizziness/numbness). Pt states lab work and scans came back inconclusive. Pt has appt for 10/23/21 for 4 months follow up and is wanting to know if that can be done today also. Pt states he has not had any more symptoms at this time. Pt has one pill left on Chantix  Recently in the hospital for TIA Starting to be better overall He is doing his best to cut back on alcohol use.  Trying to eat healthy.  Taking his medications as directed.  Patient had CTA as well as CT as well as MRI.  He will be following up with neurology regarding his carpal tunnel.  Review of Systems     Objective:   Physical Exam General-in no acute distress Eyes-no discharge Lungs-respiratory rate normal, CTA CV-no murmurs,RRR Extremities skin warm dry no edema Neuro grossly normal Behavior normal, alert        Assessment & Plan:   1. Type 2 diabetes mellitus without complication, without long-term current use of insulin (HCC) Diabetes under good control watch diet closely stick with medicine diet and exercise recheck labs before follow-up visit later this year - Hemoglobin A1c - Microalbumin/Creatinine Ratio, Urine - Basic metabolic panel - Lipid panel - Hepatic function panel - Ferritin  2. Fatty liver Liver enzymes look good healthy diet recommended minimize alcohol no more than 1 or 2 beers per day - Hemoglobin A1c - Microalbumin/Creatinine Ratio, Urine - Basic metabolic panel - Lipid panel - Hepatic function panel - Ferritin  3. Elevated ferritin Minimize alcohol healthy eating - Hemoglobin A1c - Microalbumin/Creatinine Ratio, Urine - Basic metabolic panel - Lipid panel - Hepatic function panel - Ferritin  4. Hyperlipidemia associated with type 2 diabetes mellitus (HCC) Continue  medication watch diet closely - Hemoglobin A1c - Microalbumin/Creatinine Ratio, Urine - Basic metabolic panel - Lipid panel - Hepatic function panel - Ferritin  5. High risk medication use Check labs - Hemoglobin A1c - Microalbumin/Creatinine Ratio, Urine - Basic metabolic panel - Lipid panel - Hepatic function panel - Ferritin Once patient is finished with aspirin and Plavix recommend just aspirin only Risk factors and risk modification discussed in detail patient had extensive testing while in the hospital no additional testing necessary currently  Warning signs regarding stroke discussed follow-up immediately if any problems  Follow-up late fall

## 2021-10-23 ENCOUNTER — Ambulatory Visit: Payer: Self-pay | Admitting: Family Medicine

## 2021-10-25 ENCOUNTER — Other Ambulatory Visit: Payer: Self-pay | Admitting: Family Medicine

## 2021-11-21 ENCOUNTER — Ambulatory Visit (INDEPENDENT_AMBULATORY_CARE_PROVIDER_SITE_OTHER): Payer: PRIVATE HEALTH INSURANCE | Admitting: Neurology

## 2021-11-21 ENCOUNTER — Encounter: Payer: Self-pay | Admitting: Neurology

## 2021-11-21 VITALS — BP 137/79 | HR 81 | Ht 71.0 in | Wt 239.0 lb

## 2021-11-21 DIAGNOSIS — G4733 Obstructive sleep apnea (adult) (pediatric): Secondary | ICD-10-CM

## 2021-11-21 DIAGNOSIS — Z9989 Dependence on other enabling machines and devices: Secondary | ICD-10-CM | POA: Diagnosis not present

## 2021-11-21 DIAGNOSIS — Z8673 Personal history of transient ischemic attack (TIA), and cerebral infarction without residual deficits: Secondary | ICD-10-CM | POA: Diagnosis not present

## 2021-11-21 NOTE — Patient Instructions (Signed)
It was nice to see you again today.  I am glad to hear that things are going well with your new AutoPap machine.  You are compliant with treatment.    Please continue using your autoPAP regularly. While your insurance requires that you use PAP at least 4 hours each night on 70% of the nights, I recommend, that you not skip any nights and use it throughout the night if you can. Getting used to PAP and staying with the treatment long term does take time and patience and discipline. Untreated obstructive sleep apnea when it is moderate to severe can have an adverse impact on cardiovascular health and raise her risk for heart disease, arrhythmias, hypertension, congestive heart failure, stroke and diabetes. Untreated obstructive sleep apnea causes sleep disruption, nonrestorative sleep, and sleep deprivation. This can have an impact on your day to day functioning and cause daytime sleepiness and impairment of cognitive function, memory loss, mood disturbance, and problems focussing. Using PAP regularly can improve these symptoms.  We can see you in 1 year, you can see one of our nurse practitioners as you are stable. I will see you after that.

## 2021-11-21 NOTE — Progress Notes (Signed)
Subjective:    Patient ID: Blake [name] is a 41 y.o. male.  HPI    Interim history:   Blake [name] is a 40 year old right-handed gentleman with an underlying medical history of diabetes, fatty liver, smoking with recent cessation, hyperlipidemia, and obesity, who presents for follow-up consultation of his obstructive sleep apnea after interim testing and starting AutoPap therapy.  The patient is unaccompanied today.  I first met him at the request of his primary care physician on 05/15/2021, at which time he reported a longstanding history of sleep apnea.  He had an older CPAP machine and was compliant with treatment.  He was advised to proceed with a sleep study for reassessment.  He had a home sleep test on 07/31/2021 which showed an AHI of 6.5/h, O2 nadir 85% with mild to moderate intermittent snoring detected.  I prescribed a new AutoPap machine.  His set up date was 08/16/2021.  He has a ResMed air sense 11 AutoSet machine.   Today, 11/21/2021: I reviewed his AutoPap compliance data from 10/07/2021 through 11/05/2021, which is a total of 30 days, during which time he used his machine every night with percent use days greater than 4 hours at 80%, indicating very good compliance with an average usage of 5 hours and 38 minutes, residual AHI at goal at 0.4/h, 95th percentile of pressure at 11.1 cm with a range of 6 to 12 cm with EPR.  Leak on the lower side with the 95th percentile at 0.6 L/min.  His DME company is [employer].  He reports doing well with his AutoPap, he is compliant with treatment, [place]  He had an admission to the hospital overnight with symptoms of numbness in his right upper extremity, dizziness, headache and elevated blood pressure.  He had work-up for suspected TIA, work-up was benign.  I reviewed his hospital records from his admission in June 2023.  Brain MRI without contrast from 09/26/2021 showed [name] acute infarction, hemorrhage or mass.   Cervical spine MRI without contrast on 09/26/2021 showed: IMPRESSION: 1. Mild left neural foraminal stenosis at C5-C6 and moderate left neural foraminal stenosis at C6-C7 due to prominent endplate spurs and a probable disc protrusion component at C5-C6, with potential irritation of the exiting C6 and/or C7 nerve root. There is mild narrowing of the left aspect of the thecal sac at these levels with mass effect on the ventral cord surface, worse at C5-C6, but [name] cord signal abnormality. 2. Mild right neural foraminal stenosis at C4-C5.  CT angiogram of the neck with and without contrast on 09/25/2021 showed: IMPRESSION: 1.  [name] intracranial large vessel occlusion or significant stenosis. 2.  [name] hemodynamically significant stenosis in the neck.  Echocardiogram on 09/26/2021 showed EF of 60 to 65%, normal left ventricular function.  Trivial mitral valve regurgitation.  [name] aortic stenosis.  He is currently on baby aspirin, [name] recurrence of symptoms.  He quit smoking in June 2023.  He has lost about 30 pounds since the beginning of the year.  He has cut back on his alcohol consumption and limits himself to a few drinks per weekend.  He has seen a Publishing rights manager.  He was advised that he probably has carpal tunnel syndrome.  He has been using a wrist splint overnight on the right side which has helped.  The patient's allergies, current medications, family history, past medical history, past social history, past surgical history and problem list were reviewed and updated as appropriate.  Previously:   05/15/21: (He) was previously diagnosed with obstructive sleep apnea.  He has been on CPAP therapy for years, over 10 as per his recollection.  I reviewed your office note from 02/21/2021.  I was able to review his sleep study from 12/14/2012.  He had a split-night sleep study at [facility] sleep lab, study was interpreted by Dr. Phillips Odor.  He is baseline AHI was 26/h with O2 nadir of 88%.  He  was titrated on CPAP at 4 cm to 7 cm.  A CPAP treatment pressure of 6 cm was recommended at that time.  He reports having had CPAP before 2014.  He has been on CPAP therapy with a ResMed S9 Escape machine.  He brought in his machine and I was able to review his compliance data for the past 30 and 90 days.  He has been compliant with treatment, average usage in the past month of 6 hours and 27 minutes, pressure at 10 cm, set pressure is at 9.8 cm without EPR.  Residual AHI information and leak information not available on this report.  His wife reports residual snoring and air leakage sounds.  He uses a large F10 fullface mask from [employer].  He works in Engineer, technical sales for [employer].  He goes to bed around 1130 or 1 AM and rise time is between 7 and 7:30 AM.  He lives with his wife and 2 children, they have several pets in the household and chickens outside.  He has been working on weight loss.  He has been working on alcohol cessation and smoking cessation.  He currently smokes about half a pack per day.  He is not aware of any family history of sleep apnea.  He denies night denied nocturia or recurrent morning headaches.  Over the years he has been consistent with his CPAP therapy and compliant with treatment.  Prior DME was [employer] [facility] but lately he has been buying supplies online.  Sometimes his headgear feels very tight and comes into the flesh at the base of the head.  His Past Medical History Is Significant For: Past Medical History:  Diagnosis Date   Depression    Elevated ferritin 06/25/2019   High blood cholesterol level    Hypertension    Sleep apnea 01/07/2013   cpap   TIA (transient ischemic attack)    Tobacco abuse 11/19/2018   Patient working very hard at quitting   Type 2 diabetes mellitus ([place]) 11/19/2018    His Past Surgical History Is Significant For: Past Surgical History:  Procedure Laterality Date   ABDOMINAL SURGERY     APPENDECTOMY     CHOLECYSTECTOMY      His Family History  Is Significant For: Family History  Problem Relation Age of Onset   Schizophrenia Father    Alcohol abuse Father    Diabetes Father    Alcohol abuse Maternal Grandfather    Alcohol abuse Paternal Uncle    Sleep apnea Neg Hx     His Social History Is Significant For: Social History   Socioeconomic History   Marital status: Married    Spouse name: Not on file   Number of children: Not on file   Years of education: Not on file   Highest education level: Not on file  Occupational History   Not on file  Tobacco Use   Smoking status: Former    Packs/day: 0.50    Years: 15.00    Total pack years: 7.50  Types: Cigarettes    Quit date: 09/07/2021    Years since quitting: 0.2   Smokeless tobacco: Never   Tobacco comments:    Started smoking again around April 2020  Vaping Use   Vaping Use: Former  Substance and Sexual Activity   Alcohol use: Yes    Alcohol/week: 0.0 standard drinks of alcohol    Comment: occas   Drug use: [name]   Sexual activity: Yes  Other Topics Concern   Not on file  Social History Narrative   Live with wife and two children in [place]. Like woodworking in free time.      Right handed   Caffeine: 1 cup/day   Social Determinants of Health   Financial Resource Strain: Not on file  Food Insecurity: Not on file  Transportation Needs: Not on file  Physical Activity: Not on file  Stress: Not on file  Social Connections: Not on file    His Allergies Are:  Allergies  Allergen Reactions   Penicillins Other (See Comments)    Unknown childhood reaction  :   His Current Medications Are:  Outpatient Encounter Medications as of 11/21/2021  Medication Sig   aspirin EC 81 MG tablet Take 1 tablet (81 mg total) by mouth daily. Swallow whole.   empagliflozin (JARDIANCE) 25 MG TABS tablet TAKE 1 TABLET BY MOUTH DAILY  BEFORE BREAKFAST   glucose blood test strip Test glucose once a day DX: E11.9   lamoTRIgine (LAMICTAL) 100 MG tablet TAKE 1/2 TABLET BY  MOUTH TWICE  DAILY   lisinopril (ZESTRIL) 20 MG tablet Take 1 tablet (20 mg total) by mouth daily.   metFORMIN (GLUCOPHAGE) 500 MG tablet TAKE 2 TABLETS BY MOUTH TWICE  DAILY   rosuvastatin (CRESTOR) 40 MG tablet TAKE 1 TABLET BY MOUTH DAILY   [DISCONTINUED] clopidogrel (PLAVIX) 75 MG tablet TAKE 1 TABLET(75 MG) BY MOUTH DAILY FOR 21 DAYS   [DISCONTINUED] varenicline (CHANTIX CONTINUING MONTH PAK) 1 MG tablet Take 1 tablet by mouth Twice a day (Patient taking differently: Take 1 mg by mouth 2 (two) times daily.)   [name] facility-administered encounter medications on file as of 11/21/2021.  :  Review of Systems:  Out of a complete 14 point review of systems, all are reviewed and negative with the exception of these symptoms as listed below:  Review of Systems  Neurological:        Patient is here for initial PAP follow up. Pt feels things are going well. ESS 9.    Objective:  Neurological Exam  Physical Exam Physical Examination:   Vitals:   11/21/21 1359  BP: 137/79  Pulse: 81    General Examination: The patient is a very pleasant 41 y.o. male in [name] acute distress. He appears well-developed and well-nourished and well groomed.   HEENT: Normocephalic, atraumatic, pupils are equal, round and reactive to light, extraocular tracking is good without limitation to gaze excursion or nystagmus noted.  Corrective eyeglasses in place.  Hearing is grossly intact. Face is symmetric with normal facial animation. Speech is clear with [name] dysarthria noted. There is [name] hypophonia. There is [name] lip, neck/head, jaw or voice tremor. Neck is supple with full range of passive and active motion. There are [name] carotid bruits on auscultation. Oropharynx exam reveals: mild mouth dryness, adequate dental hygiene and moderate airway crowding. Tongue protrudes centrally and palate elevates symmetrically.   Chest: Clear to auscultation without wheezing, rhonchi or crackles noted.   Heart: S1+S2+0, regular and normal  without murmurs,  rubs or gallops noted.    Abdomen: Soft, non-tender and non-distended.   Extremities: There is [name] obvious edema.     Skin: Warm and dry without trophic changes noted.    Musculoskeletal: exam reveals [name] obvious joint deformities.    Neurologically:  Mental status: The patient is awake, alert and oriented in all 4 spheres. His immediate and remote memory, attention, language skills and fund of knowledge are appropriate. There is [name] evidence of aphasia, agnosia, apraxia or anomia. Speech is clear with normal prosody and enunciation. Thought process is linear. Mood is normal and affect is normal.  Cranial nerves II - XII are as described above under HEENT exam.  Motor exam: Normal bulk, strength and tone is noted. There is [name] tremor. Fine motor skills and coordination: grossly intact.  Cerebellar testing: [name] dysmetria or intention tremor. There is [name] truncal or gait ataxia.  Sensory exam: intact to light touch in the upper and lower extremities.  Gait, station and balance: He stands easily. [name] veering to one side is noted. [name] leaning to one side is noted. Posture is age-appropriate and stance is narrow based. Gait shows normal stride length and normal pace. [name] problems turning are noted.    Assessment and Plan:  In summary, Blake [name] is a very pleasant 41 year old male with an underlying medical history of diabetes, fatty liver, smoking with recent cessation, hospitalization in June for suspected TIA with negative work-up, hyperlipidemia, and obesity, who presents for follow-up consultation of his obstructive sleep apnea.  He had an interim home sleep test, carries a prior diagnosis of obstructive sleep apnea for years and has been on an older CPAP machine.  Home sleep test from 07/31/2021 showed an AHI of 6.5/h, O2 nadir 85% with intermittent mild to moderate snoring detected.  He established treatment with a new AutoPap machine in May 2023 and is compliant with treatment.   He continues to benefit from positive airway pressure treatment.  He is commended for his treatment adherence.  He had a TIA work-up in June 2023 with benign results.  He is on a baby aspirin and cholesterol medication and is advised to continue to pursue a healthy lifestyle, complete smoking cessation, ongoing compliance with his AutoPap machine.  He has established with a new local DME company and is up-to-date with his supplies.  He is advised to follow-up routinely at this point in 1 year to see one of our nurse practitioners.   I answered all his questions today and he was in agreement. I spent 35 minutes in total face-to-face time and in reviewing records during pre-charting, more than 50% of which was spent in counseling and coordination of care, reviewing test results, reviewing medications and treatment regimen and/or in discussing or reviewing the diagnosis of TIA, OSA, the prognosis and treatment options. Pertinent laboratory and imaging test results that were available during this visit with the patient were reviewed by me and considered in my medical decision making (see chart for details).

## 2021-11-28 ENCOUNTER — Ambulatory Visit: Payer: PRIVATE HEALTH INSURANCE | Admitting: Behavioral Health

## 2021-12-22 ENCOUNTER — Ambulatory Visit (INDEPENDENT_AMBULATORY_CARE_PROVIDER_SITE_OTHER): Payer: PRIVATE HEALTH INSURANCE | Admitting: Behavioral Health

## 2021-12-22 ENCOUNTER — Encounter: Payer: Self-pay | Admitting: Behavioral Health

## 2021-12-22 DIAGNOSIS — R454 Irritability and anger: Secondary | ICD-10-CM | POA: Diagnosis not present

## 2021-12-22 DIAGNOSIS — F411 Generalized anxiety disorder: Secondary | ICD-10-CM | POA: Diagnosis not present

## 2021-12-22 DIAGNOSIS — F39 Unspecified mood [affective] disorder: Secondary | ICD-10-CM | POA: Diagnosis not present

## 2021-12-22 MED ORDER — LAMOTRIGINE 150 MG PO TABS: 150.0000 mg | ORAL_TABLET | Freq: Every day | ORAL | 1 refills | Status: DC

## 2021-12-22 NOTE — Progress Notes (Signed)
[facility]  Patient ID: Blake [name],  MRN: [id number]  PCP: Babs Sciara, MD  Date of Evaluation: 12/22/2021 Time spent:30 minutes  Chief Complaint:  Chief Complaint   Anxiety; Follow-up; Irritability; Medication Refill; Patient Education     HISTORY/CURRENT STATUS: HPI  41 year old male presents to this office for follow up and medication management. Says he is not sure why, but he has been experiencing an increase again in irritability and moodiness.  This has been progressing the last month. Says his wife has taken notice recently. He feels like he could use an adjustment to his Lamictal right now. His anxiety today  is 2/10 and depression is 0/10. He sleeps about 7 hours per night now which is also new improvement. He does endorse sleep apnea and wears C-Pap at while sleeping. He denies mania, no psychosis, no SI/HI.   Prior psychiatric medication list:   Wellbutrin Zoloft Prozac Chantix     Prior psychiatric medication list:   Wellbutrin Zoloft Prozac Chantix    Individual Medical History/ Review of Systems: Changes? :No   Allergies: Penicillins  Current Medications:  Current Outpatient Medications:    lamoTRIgine (LAMICTAL) 150 MG tablet, Take 1 tablet (150 mg total) by mouth daily., Disp: 90 tablet, Rfl: 1   aspirin EC 81 MG tablet, Take 1 tablet (81 mg total) by mouth daily. Swallow whole., Disp: 150 tablet, Rfl: 2   empagliflozin (JARDIANCE) 25 MG TABS tablet, TAKE 1 TABLET BY MOUTH DAILY  BEFORE BREAKFAST, Disp: 90 tablet, Rfl: 1   glucose blood test strip, Test glucose once a day DX: E11.9, Disp: 50 each, Rfl: 5   lamoTRIgine (LAMICTAL) 100 MG tablet, TAKE 1/2 TABLET BY MOUTH TWICE  DAILY, Disp: 90 tablet, Rfl: 3   lisinopril (ZESTRIL) 20 MG tablet, Take 1 tablet (20 mg total) by mouth daily., Disp: 90 tablet, Rfl: 3   metFORMIN (GLUCOPHAGE) 500 MG tablet, TAKE 2 TABLETS BY MOUTH TWICE  DAILY, Disp: 360 tablet, Rfl: 1   rosuvastatin  (CRESTOR) 40 MG tablet, TAKE 1 TABLET BY MOUTH DAILY, Disp: 90 tablet, Rfl: 1 Medication Side Effects: none  Family Medical/ Social History: Changes? No  MENTAL HEALTH EXAM:  There were no vitals taken for this visit.There is no height or weight on file to calculate BMI.  General Appearance: Casual and Well Groomed  Eye Contact:  Good  Speech:  Clear and Coherent  Volume:  Normal  Mood:  NA  Affect:  Appropriate  Thought Process:  Coherent  Orientation:  Full (Time, Place, and Person)  Thought Content: Logical   Suicidal Thoughts:  No  Homicidal Thoughts:  No  Memory:  WNL  Judgement:  Good  Insight:  Good  Psychomotor Activity:  Normal  Concentration:  Concentration: Good  Recall:  Good  Fund of Knowledge: Good  Language: Good  Assets:  Desire for Improvement  ADL's:  Intact  Cognition: WNL  Prognosis:  Good    DIAGNOSES:    ICD-10-CM   1. Unspecified mood (affective) disorder (HCC)  F39 lamoTRIgine (LAMICTAL) 150 MG tablet    2. Irritability  R45.4 lamoTRIgine (LAMICTAL) 150 MG tablet    3. Generalized anxiety disorder  F41.1 lamoTRIgine (LAMICTAL) 150 MG tablet      Receiving Psychotherapy: No    RECOMMENDATIONS:   Greater than 50% of 30 min face to face time with patient was spent on counseling and coordination of care. Discussed his slight decline with his moods recently. He is very  happy with how Lamictal has helped him but feels like the medication has lost some of its efficacy. He has stopped smoking and no cigarettes or nicotine since last visit.   No medication changes this visit are indicated.   We agreed to; Increase  Lamictal to 150 mg.  Will report worsening symptoms promptly To follow up in 8 weeks  reassess Provided emergency contact information Monitor for any sign of rash. Please taking Lamictal and contact office immediately rash develops. Recommend seeking urgent medical attention if rash is severe and/or spreading quickly.  Reviewed PDMP       Joan Flores, NP

## 2022-02-13 ENCOUNTER — Other Ambulatory Visit: Payer: Self-pay | Admitting: Family Medicine

## 2022-02-14 ENCOUNTER — Other Ambulatory Visit: Payer: Self-pay | Admitting: Family Medicine

## 2022-02-21 ENCOUNTER — Encounter: Payer: Self-pay | Admitting: Behavioral Health

## 2022-02-21 ENCOUNTER — Ambulatory Visit (INDEPENDENT_AMBULATORY_CARE_PROVIDER_SITE_OTHER): Payer: PRIVATE HEALTH INSURANCE | Admitting: Behavioral Health

## 2022-02-21 DIAGNOSIS — F39 Unspecified mood [affective] disorder: Secondary | ICD-10-CM | POA: Diagnosis not present

## 2022-02-21 DIAGNOSIS — F1010 Alcohol abuse, uncomplicated: Secondary | ICD-10-CM | POA: Diagnosis not present

## 2022-02-21 DIAGNOSIS — R454 Irritability and anger: Secondary | ICD-10-CM | POA: Diagnosis not present

## 2022-02-21 DIAGNOSIS — F411 Generalized anxiety disorder: Secondary | ICD-10-CM

## 2022-02-21 MED ORDER — LAMOTRIGINE 150 MG PO TABS: 150.0000 mg | ORAL_TABLET | Freq: Every day | ORAL | 1 refills | Status: DC

## 2022-02-21 NOTE — Progress Notes (Signed)
[facility]  Patient ID: Blake [name],  MRN: [id number]  PCP: Babs Sciara, MD  Date of Evaluation: 02/21/2022 Time spent:30 minutes  Chief Complaint:  Chief Complaint   Anxiety; Medication Refill; Follow-up; Patient Education     HISTORY/CURRENT STATUS: HPI 41 year old male presents to this office for follow up and medication management. Says moods and irritability have improved significantly. He does not want to change medications at this time.  He is requesting 6 month f/u.  His anxiety today  is 2/10 and depression is 0/10. He sleeps about 7 hours per night now which is also new improvement. He does endorse sleep apnea and wears C-Pap at while sleeping. He denies mania, no psychosis, no SI/HI.   Prior psychiatric medication list:   Wellbutrin Zoloft Prozac Chantix   Individual Medical History/ Review of Systems: Changes? :No   Allergies: Penicillins  Current Medications:  Current Outpatient Medications:    aspirin EC 81 MG tablet, Take 1 tablet (81 mg total) by mouth daily. Swallow whole., Disp: 150 tablet, Rfl: 2   empagliflozin (JARDIANCE) 25 MG TABS tablet, TAKE 1 TABLET BY MOUTH DAILY  BEFORE BREAKFAST, Disp: 90 tablet, Rfl: 1   glucose blood test strip, Test glucose once a day DX: E11.9, Disp: 50 each, Rfl: 5   lamoTRIgine (LAMICTAL) 150 MG tablet, Take 1 tablet (150 mg total) by mouth daily., Disp: 90 tablet, Rfl: 1   lisinopril (ZESTRIL) 20 MG tablet, Take 1 tablet (20 mg total) by mouth daily., Disp: 90 tablet, Rfl: 3   metFORMIN (GLUCOPHAGE) 500 MG tablet, TAKE 2 TABLETS BY MOUTH TWICE  DAILY, Disp: 360 tablet, Rfl: 0   rosuvastatin (CRESTOR) 40 MG tablet, TAKE 1 TABLET BY MOUTH DAILY, Disp: 90 tablet, Rfl: 0 Medication Side Effects: none  Family Medical/ Social History: Changes? No  MENTAL HEALTH EXAM:  There were no vitals taken for this visit.There is no height or weight on file to calculate BMI.  General Appearance: Casual, Neat,  and Well Groomed  Eye Contact:  Good  Speech:  Clear and Coherent  Volume:  Normal  Mood:  NA  Affect:  Appropriate  Thought Process:  Coherent  Orientation:  Full (Time, Place, and Person)  Thought Content: Logical   Suicidal Thoughts:  No  Homicidal Thoughts:  No  Memory:  WNL  Judgement:  Good  Insight:  Good  Psychomotor Activity:  Normal  Concentration:  Concentration: Good  Recall:  Good  Fund of Knowledge: Good  Language: Good  Assets:  Desire for Improvement  ADL's:  Intact  Cognition: WNL  Prognosis:  Good    DIAGNOSES:    ICD-10-CM   1. Unspecified mood (affective) disorder (HCC)  F39 lamoTRIgine (LAMICTAL) 150 MG tablet    2. Irritability  R45.4 lamoTRIgine (LAMICTAL) 150 MG tablet    3. Generalized anxiety disorder  F41.1 lamoTRIgine (LAMICTAL) 150 MG tablet    4. Alcohol use disorder, mild, abuse  F10.10       Receiving Psychotherapy: No    RECOMMENDATIONS:   Greater than 50% of 30 min face to face time with patient was spent on counseling and coordination of care. Discussed his slight decline with his moods recently. He is very happy with how Lamictal has helped him but feels like the medication has lost some of its efficacy. He has stopped smoking and no cigarettes or nicotine since last visit.   No medication changes this visit are indicated.    We agreed to;  Continue Lamictal to 150 mg.  Will report worsening symptoms promptly To follow up in 6 months to  reassess per pt Provided emergency contact information Monitor for any sign of rash. Please taking Lamictal and contact office immediately rash develops. Recommend seeking urgent medical attention if rash is severe and/or spreading quickly.  Reviewed PDMP     Elwanda Brooklyn, NP

## 2022-03-26 ENCOUNTER — Ambulatory Visit (INDEPENDENT_AMBULATORY_CARE_PROVIDER_SITE_OTHER): Payer: PRIVATE HEALTH INSURANCE | Admitting: Family Medicine

## 2022-03-26 VITALS — BP 130/86 | HR 80 | Temp 97.7°F | Ht 71.0 in | Wt 244.0 lb

## 2022-03-26 DIAGNOSIS — E785 Hyperlipidemia, unspecified: Secondary | ICD-10-CM

## 2022-03-26 DIAGNOSIS — Z0000 Encounter for general adult medical examination without abnormal findings: Secondary | ICD-10-CM | POA: Diagnosis not present

## 2022-03-26 DIAGNOSIS — E119 Type 2 diabetes mellitus without complications: Secondary | ICD-10-CM

## 2022-03-26 DIAGNOSIS — K760 Fatty (change of) liver, not elsewhere classified: Secondary | ICD-10-CM | POA: Diagnosis not present

## 2022-03-26 DIAGNOSIS — Z789 Other specified health status: Secondary | ICD-10-CM

## 2022-03-26 DIAGNOSIS — R7989 Other specified abnormal findings of blood chemistry: Secondary | ICD-10-CM | POA: Diagnosis not present

## 2022-03-26 DIAGNOSIS — E1169 Type 2 diabetes mellitus with other specified complication: Secondary | ICD-10-CM | POA: Diagnosis not present

## 2022-03-26 MED ORDER — LISINOPRIL 20 MG PO TABS: 20.0000 mg | ORAL_TABLET | Freq: Every day | ORAL | 3 refills | Status: DC

## 2022-03-26 MED ORDER — EMPAGLIFLOZIN 25 MG PO TABS: 25.0000 mg | ORAL_TABLET | Freq: Every day | ORAL | 1 refills | Status: DC

## 2022-03-26 MED ORDER — ROSUVASTATIN CALCIUM 40 MG PO TABS: 40.0000 mg | ORAL_TABLET | Freq: Every day | ORAL | 1 refills | Status: DC

## 2022-03-26 NOTE — Progress Notes (Signed)
  Subjective:    Patient ID: Blake [name], male    DOB: July 10, 1980, 41 y.o.   MRN: [id number]  HPI The patient comes in today for a wellness visit.    A review of their health history was completed.  A review of medications was also completed.  Any needed refills; needs some refills  Eating habits: Eating habits are subpar eats too much junk food he is also unfortunately drinking alcohol more in the evening times  Falls/  MVA accidents in past few months: Denies any injuries  Regular exercise: Does some walking with a job  Specialist pt sees on regular basis: None  Preventative health issues were discussed.   Additional concerns: He is concerned about the alcohol he would like to cut back he denies being depressed or anxious He relates that he drinks 4 hard beers each evening which is the equivalent of 8 standard beers Wellness exam  Review of Systems     Objective:   Physical Exam  General-in no acute distress Eyes-no discharge Lungs-respiratory rate normal, CTA CV-no murmurs,RRR Extremities skin warm dry no edema Neuro grossly normal Behavior normal, alert Neck no masses Abdomen is soft no masses     Assessment & Plan:  1. Well adult exam Adult wellness-complete.wellness physical was conducted today. Importance of diet and exercise were discussed in detail.  Importance of stress reduction and healthy living were discussed.  In addition to this a discussion regarding safety was also covered.  We also reviewed over immunizations and gave recommendations regarding current immunization needed for age.   In addition to this additional areas were also touched on including: Preventative health exams needed:  Colonoscopy not indicated  Patient was advised yearly wellness exam   2. Fatty liver The best approach is healthy diet he will check his lab work in the near future await the results  3. Elevated ferritin This is been related to his alcohol use he is  now convinced that he needs to quit alcohol.  We will refer him to [facility] services  4. Hyperlipidemia associated with type 2 diabetes mellitus (HCC) Continue medication check lab work await results  5. Type 2 diabetes mellitus without complication, without long-term current use of insulin (HCC) Check A1c cut back on starches healthier diet  6. Alcohol use Patient dealing with alcoholism I have encouraged him to taper off over the course the next 70 days if his liver enzymes look good the next step will be to utilize naltrexone 50 mg daily Referral to [facility] services for counseling Follow-up here in 6 weeks time Warning signs regarding physical withdrawal was discussed if he starts having this notify us we may have to utilize a benzodiazepine taper  We did is how he would need to go from for of these beers each evening down to 3 each evening for the next 3 days then down to 2 each evening for 3 days then 1 each evening for 3 days and watch for any signs of physical withdrawal then start naltrexone-she states he is been without alcohol for couple days in the past and did not have physical withdrawal but he did relates that he had psychologic desire to have a beer

## 2022-03-26 NOTE — Patient Instructions (Signed)
Hi Blake [name] that you came in today  I certainly am supportive of your desire to get away from alcohol We will send a referral to [facility] In addition to this I would recommend tapering your use of alcohol over the next several days before we start naltrexone  Definitely need to do the blood work in order to make sure your liver enzymes are in a reasonable range In regards to alcohol instead of 4 of the hard beers, I would recommend utilizing 3 per evening for the next 2 nights, then 2 per evening for the next 2 nights, then 1 per evening for 2 to 3 nights then you will start the naltrexone daily  Please watch for any physical symptoms of withdrawal that we discussed.  If you should have any of the symptoms please let me know and be in touch with [place] right away  I would like to see you back in 6 weeks time TakeCare-Dr. Lorin Picket

## 2022-03-27 LAB — HM DIABETES EYE EXAM

## 2022-03-28 ENCOUNTER — Ambulatory Visit
Admission: EM | Admit: 2022-03-28 | Discharge: 2022-03-28 | Disposition: A | Discharge status: Home / Self Care | Payer: PRIVATE HEALTH INSURANCE | Attending: Family Medicine | Admitting: Family Medicine

## 2022-03-28 DIAGNOSIS — R350 Frequency of micturition: Secondary | ICD-10-CM

## 2022-03-28 LAB — POCT URINALYSIS DIP (MANUAL ENTRY)
Bilirubin, UA: NEGATIVE
Blood, UA: NEGATIVE
Glucose, UA: >=1000 mg/dL — AB
Leukocytes, UA: NEGATIVE
Nitrite, UA: NEGATIVE
Protein Ur, POC: NEGATIVE mg/dL
Spec Grav, UA: 1.02 (ref 1.010–1.025)
Urobilinogen, UA: 0.2 E.U./dL
pH, UA: 6 (ref 5.0–8.0)

## 2022-03-28 LAB — POCT FASTING CBG KUC MANUAL ENTRY: POCT Glucose (KUC): 140 mg/dL — AB (ref 70–99)

## 2022-03-28 NOTE — ED Triage Notes (Signed)
Urinary frequency, pressure in bladder/ lower abdomen, dull pain in right side, no pain when urinating. Urinary frequency started 3 days ago and pain yesterday.

## 2022-03-28 NOTE — Discharge Instructions (Signed)
Drink plenty of water, keep her sugars under good control, avoid bladder irritants such as caffeine, alcohol, sugary beverages.  Follow-up if your symptoms are worsening

## 2022-03-29 LAB — MICROALBUMIN / CREATININE URINE RATIO
Creatinine, Urine: 97.2 mg/dL
Microalb/Creat Ratio: 36 mg/g creat — ABNORMAL HIGH (ref 0–29)
Microalbumin, Urine: 34.7 ug/mL

## 2022-03-29 LAB — HEPATIC FUNCTION PANEL
ALT: 51 IU/L — ABNORMAL HIGH (ref 0–44)
AST: 23 IU/L (ref 0–40)
Albumin: 4.9 g/dL (ref 4.1–5.1)
Alkaline Phosphatase: 72 IU/L (ref 44–121)
Bilirubin Total: 0.3 mg/dL (ref 0.0–1.2)
Bilirubin, Direct: 0.24 mg/dL (ref 0.00–0.40)
Total Protein: 7 g/dL (ref 6.0–8.5)

## 2022-03-29 LAB — HEMOGLOBIN A1C
Est. average glucose Bld gHb Est-mCnc: 143 mg/dL
Hgb A1c MFr Bld: 6.6 % — ABNORMAL HIGH (ref 4.8–5.6)

## 2022-03-29 LAB — BASIC METABOLIC PANEL
BUN/Creatinine Ratio: 12 (ref 9–20)
BUN: 10 mg/dL (ref 6–20)
CO2: 21 mmol/L (ref 20–29)
Calcium: 10 mg/dL (ref 8.7–10.2)
Chloride: 103 mmol/L (ref 96–106)
Creatinine, Ser: 0.86 mg/dL (ref 0.76–1.27)
Glucose: 136 mg/dL — ABNORMAL HIGH (ref 70–99)
Potassium: 4.5 mmol/L (ref 3.5–5.2)
Sodium: 142 mmol/L (ref 134–144)
eGFR: 113 mL/min/{1.73_m2} (ref >59)

## 2022-03-29 LAB — LIPID PANEL
Chol/HDL Ratio: 4.1 ratio (ref 0.0–5.0)
Cholesterol, Total: 169 mg/dL (ref 100–199)
HDL: 41 mg/dL (ref >39)
LDL Chol Calc (NIH): 28 mg/dL (ref 0–99)
Triglycerides: 769 mg/dL (ref 0–149)
VLDL Cholesterol Cal: 100 mg/dL — ABNORMAL HIGH (ref 5–40)

## 2022-03-29 LAB — FERRITIN: Ferritin: 177 ng/mL (ref 30–400)

## 2022-03-31 ENCOUNTER — Encounter: Payer: Self-pay | Admitting: Family Medicine

## 2022-04-01 NOTE — ED Provider Notes (Signed)
[facility]    CSN: [id number] Arrival date & time: 03/28/22  1803      History   Chief Complaint Chief Complaint  Patient presents with   Urinary Frequency    HPI Blake [name] is a 41 y.o. male.   Presenting today with 3 day history of intermittent urinary frequency, suprapubic pressure. Denies dysuria, hematuria, flank pain, abdominal pain, fever, chills, N/V, penile discharge or rashes. So far not trying anything OTC for sxs. Of note, has a hx of DM not checking BSs consistently. No concern for STIs.     Past Medical History:  Diagnosis Date   Depression    Elevated ferritin 06/25/2019   High blood cholesterol level    Hypertension    Sleep apnea 01/07/2013   cpap   TIA (transient ischemic attack)    Tobacco abuse 11/19/2018   Patient working very hard at quitting   Type 2 diabetes mellitus (HCC) 11/19/2018    Patient Active Problem List   Diagnosis Date Noted   Transient ischemic attack (TIA) 09/25/2021   Essential hypertension 09/25/2021   Umbilical hernia without obstruction and without gangrene 02/21/2021   Elevated ferritin 06/25/2019   Type 2 diabetes mellitus (HCC) 11/19/2018   Tobacco abuse 11/19/2018   Depression, major, single episode, mild (HCC) 11/21/2016   Obesity (BMI 30.0-34.9) 05/19/2016   Obstructive sleep apnea 10/27/2013   Fatty liver 10/27/2013   Hypertriglyceridemia 12/15/2012    Past Surgical History:  Procedure Laterality Date   ABDOMINAL SURGERY     APPENDECTOMY     CHOLECYSTECTOMY         Home Medications    Prior to Admission medications   Medication Sig Start Date End Date Taking? Authorizing Provider  aspirin EC 81 MG tablet Take 1 tablet (81 mg total) by mouth daily. Swallow whole. 09/26/21 09/26/22 Yes Shah, Pratik D, DO  empagliflozin (JARDIANCE) 25 MG TABS tablet Take 1 tablet (25 mg total) by mouth daily before breakfast. 03/26/22  Yes Luking, Scott A, MD  glucose blood test strip Test glucose once  a day DX: E11.9 02/24/19  Yes Luking, Jonna Coup, MD  lamoTRIgine (LAMICTAL) 150 MG tablet Take 1 tablet (150 mg total) by mouth daily. 02/21/22  Yes White, Arlys John A, NP  lisinopril (ZESTRIL) 20 MG tablet Take 1 tablet (20 mg total) by mouth daily. 03/26/22  Yes Babs Sciara, MD  metFORMIN (GLUCOPHAGE) 500 MG tablet TAKE 2 TABLETS BY MOUTH TWICE  DAILY 02/14/22  Yes Luking, Scott A, MD  rosuvastatin (CRESTOR) 40 MG tablet Take 1 tablet (40 mg total) by mouth daily. 03/26/22  Yes Babs Sciara, MD    Family History Family History  Problem Relation Age of Onset   Schizophrenia Father    Alcohol abuse Father    Diabetes Father    Alcohol abuse Maternal Grandfather    Alcohol abuse Paternal Uncle    Sleep apnea Neg Hx     Social History Social History   Tobacco Use   Smoking status: Former    Packs/day: 0.50    Years: 15.00    Total pack years: 7.50    Types: Cigarettes    Quit date: 09/07/2021    Years since quitting: 0.5   Smokeless tobacco: Never   Tobacco comments:    Started smoking again around April 2020  Vaping Use   Vaping Use: Former  Substance Use Topics   Alcohol use: Yes    Alcohol/week: 0.0 standard drinks of alcohol  Comment: occas   Drug use: No     Allergies   Penicillins   Review of Systems Review of Systems PER HPI  Physical Exam Triage Vital Signs ED Triage Vitals  Enc Vitals Group     BP 03/28/22 1937 (!) 155/97     Pulse Rate 03/28/22 1937 82     Resp 03/28/22 1937 16     Temp 03/28/22 1937 97.8 F (36.6 C)     Temp Source 03/28/22 1937 Oral     SpO2 03/28/22 1937 98 %     Weight --      Height --      Head Circumference --      Peak Flow --      Pain Score 03/28/22 1954 3     Pain Loc --      Pain Edu? --      Excl. in GC? --    No data found.  Updated Vital Signs BP (!) 155/97 (BP Location: Right Arm)   Pulse 82   Temp 97.8 F (36.6 C) (Oral)   Resp 16   SpO2 98%   Visual Acuity Right Eye Distance:   Left Eye  Distance:   Bilateral Distance:    Right Eye Near:   Left Eye Near:    Bilateral Near:     Physical Exam Vitals and nursing note reviewed.  Constitutional:      Appearance: Normal appearance.  HENT:     Head: Atraumatic.  Eyes:     Extraocular Movements: Extraocular movements intact.     Conjunctiva/sclera: Conjunctivae normal.  Cardiovascular:     Rate and Rhythm: Normal rate and regular rhythm.  Pulmonary:     Effort: Pulmonary effort is normal.     Breath sounds: Normal breath sounds.  Abdominal:     General: Bowel sounds are normal. There is no distension.     Palpations: Abdomen is soft.     Tenderness: There is no abdominal tenderness. There is no right CVA tenderness, left CVA tenderness or guarding.  Musculoskeletal:        General: Normal range of motion.     Cervical back: Normal range of motion and neck supple.  Skin:    General: Skin is warm and dry.  Neurological:     General: No focal deficit present.     Mental Status: He is oriented to person, place, and time.  Psychiatric:        Mood and Affect: Mood normal.        Thought Content: Thought content normal.        Judgment: Judgment normal.      UC Treatments / Results  Labs (all labs ordered are listed, but only abnormal results are displayed) Labs Reviewed  POCT URINALYSIS DIP (MANUAL ENTRY) - Abnormal; Notable for the following components:      Result Value   Glucose, UA >=1,000 (*)    Ketones, POC UA trace (5) (*)    All other components within normal limits  POCT FASTING CBG [facility] MANUAL ENTRY - Abnormal; Notable for the following components:   POCT Glucose ([facility]) 140 (*)    All other components within normal limits    EKG   Radiology No results found.  Procedures Procedures (including critical care time)  Medications Ordered in UC Medications - No data to display  Initial Impression / Assessment and Plan / UC Course  I have reviewed the triage vital signs and the nursing  notes.  Pertinent labs & imaging results that were available during my care of the patient were reviewed by me and considered in my medical decision making (see chart for details).     POC glucose 140, U/A without evidence of UTI, kidney stone or other obvious abnormality. Low suspicion for jardiance causing his sxs as this is not a new medication. Discussed avoiding bladder irritants, pushing fluids, following up with PCP if not resolving.   Final Clinical Impressions(s) / UC Diagnoses   Final diagnoses:  Urinary frequency     Discharge Instructions      Drink plenty of water, keep her sugars under good control, avoid bladder irritants such as caffeine, alcohol, sugary beverages.  Follow-up if your symptoms are worsening    ED Prescriptions   None    PDMP not reviewed this encounter.   Particia Nearing, [place] 04/01/22 2049

## 2022-04-03 NOTE — Telephone Encounter (Signed)
Good morning Dr.Scott, Thank you for responding so quickly. I apologize for not replying yesterday. Please send to [facility] on [address]. [employer] Christmas to you and your family, Brett Canales

## 2022-04-04 NOTE — Telephone Encounter (Signed)
Nurses Please send in naltrexone 50 mg 1 daily, #30, 1 refill  Please also forward following message to Rolm Bookbinder Naltrexone 50 mg 1 daily is the recommended starting dose.  After 1 to 2 weeks if you are tolerating this well and you do not feel it is helping enough regarding alcohol urges then the next step would be we would increase it to 100 mg daily.  Please feel free to give me some feedback within a couple weeks time how this is going for you we will see you for your office visit follow-up in January-sooner if any other issues  Please take care-Dr. Lorin Picket

## 2022-04-05 MED ORDER — NALTREXONE HCL 50 MG PO TABS: 50.0000 mg | ORAL_TABLET | Freq: Every day | ORAL | 1 refills | Status: DC

## 2022-04-10 ENCOUNTER — Other Ambulatory Visit: Payer: Self-pay | Admitting: Family Medicine

## 2022-04-25 ENCOUNTER — Encounter: Payer: Self-pay | Admitting: *Deleted

## 2022-05-07 ENCOUNTER — Ambulatory Visit: Payer: PRIVATE HEALTH INSURANCE | Admitting: Family Medicine

## 2022-05-07 VITALS — BP 132/80 | Wt 246.6 lb

## 2022-05-07 DIAGNOSIS — E1169 Type 2 diabetes mellitus with other specified complication: Secondary | ICD-10-CM

## 2022-05-07 DIAGNOSIS — F39 Unspecified mood [affective] disorder: Secondary | ICD-10-CM

## 2022-05-07 DIAGNOSIS — E119 Type 2 diabetes mellitus without complications: Secondary | ICD-10-CM

## 2022-05-07 DIAGNOSIS — F321 Major depressive disorder, single episode, moderate: Secondary | ICD-10-CM

## 2022-05-07 DIAGNOSIS — E785 Hyperlipidemia, unspecified: Secondary | ICD-10-CM

## 2022-05-07 DIAGNOSIS — R7989 Other specified abnormal findings of blood chemistry: Secondary | ICD-10-CM | POA: Diagnosis not present

## 2022-05-07 DIAGNOSIS — Z79899 Other long term (current) drug therapy: Secondary | ICD-10-CM

## 2022-05-07 DIAGNOSIS — Z789 Other specified health status: Secondary | ICD-10-CM | POA: Diagnosis not present

## 2022-05-07 DIAGNOSIS — K760 Fatty (change of) liver, not elsewhere classified: Secondary | ICD-10-CM

## 2022-05-07 MED ORDER — MOXIFLOXACIN HCL 0.5 % OP SOLN: OPHTHALMIC | 0 refills | Status: DC

## 2022-05-07 NOTE — Progress Notes (Addendum)
   Subjective:    Patient ID: Blake [name], male    DOB: 08-14-1980, 42 y.o.   MRN: [id number]  Diabetes He presents for his follow-up diabetic visit. He has type 2 diabetes mellitus. There are no hypoglycemic associated symptoms. There are no diabetic associated symptoms. There are no hypoglycemic complications. There are no diabetic complications. Current diabetic treatment includes oral agent (dual therapy). He does not see a podiatrist.Eye exam is current.   Hyperlipidemia associated with type 2 diabetes mellitus ([place]) - Plan: Lipid panel  Type 2 diabetes mellitus without complication, without long-term current use of insulin (HCC) - Plan: Hemoglobin I7P, Basic metabolic panel  Fatty liver - Plan: Hepatic function panel  Elevated ferritin - Plan: Ferritin  Alcohol use  High risk medication use  Depression, major, single episode, moderate (HCC)  Unspecified mood (affective) disorder ([place]), Chronic Patient doing very good job staying away from alcohol Trying to watch diet watch portion staying active Going to [employer] meetings moods overall doing okay Patient reports 31 days sober.  Pt also reports pink eye symptoms in right eye. Started on Wednesday. Swelling, pain and drainage.  Review of Systems     Objective:   Physical Exam General-in no acute distress Eyes-no discharge Lungs-respiratory rate normal, CTA CV-no murmurs,RRR Extremities skin warm dry no edema Neuro grossly normal Behavior normal, alert        Assessment & Plan:  1. Hyperlipidemia associated with type 2 diabetes mellitus (HCC) Check lipid trying to eat healthy stay away from alcohol - Lipid panel  2. Type 2 diabetes mellitus without complication, without long-term current use of insulin (HCC) Try to minimize starches and sugars in diet and staying away from alcohol - Hemoglobin O2U - Basic metabolic panel  3. Fatty liver Staying away from alcohol trying to stay active - Hepatic function  panel  4. Elevated ferritin Staying away from alcohol trying to stay active - Ferritin  5. Alcohol use Naltrexone is helping and trying to stay away from alcohol  6. High risk medication use Labs on next visit Psychiatric health issues on medication sees specialist doing well with this Follow-up in approximately 3 months  He does have conjunctivitis he was treated with drops to some degree redness around the eyelids as well if he does not improve over the next few days referral to optometry patient aware he will give [place] feedback

## 2022-08-04 LAB — BASIC METABOLIC PANEL
BUN/Creatinine Ratio: 10 (ref 9–20)
BUN: 9 mg/dL (ref 6–20)
CO2: 21 mmol/L (ref 20–29)
Calcium: 9.4 mg/dL (ref 8.7–10.2)
Chloride: 97 mmol/L (ref 96–106)
Creatinine, Ser: 0.88 mg/dL (ref 0.76–1.27)
Glucose: 157 mg/dL — ABNORMAL HIGH (ref 70–99)
Potassium: 4.2 mmol/L (ref 3.5–5.2)
Sodium: 137 mmol/L (ref 134–144)
eGFR: 112 mL/min/{1.73_m2} (ref >59)

## 2022-08-04 LAB — HEPATIC FUNCTION PANEL
ALT: 49 IU/L — ABNORMAL HIGH (ref 0–44)
AST: 29 IU/L (ref 0–40)
Albumin: 4.5 g/dL (ref 4.1–5.1)
Alkaline Phosphatase: 73 IU/L (ref 44–121)
Bilirubin Total: 0.3 mg/dL (ref 0.0–1.2)
Bilirubin, Direct: 0.13 mg/dL (ref 0.00–0.40)
Total Protein: 6.8 g/dL (ref 6.0–8.5)

## 2022-08-04 LAB — LIPID PANEL
Chol/HDL Ratio: 6.1 ratio — ABNORMAL HIGH (ref 0.0–5.0)
Cholesterol, Total: 200 mg/dL — ABNORMAL HIGH (ref 100–199)
HDL: 33 mg/dL — ABNORMAL LOW (ref >39)
Triglycerides: 810 mg/dL (ref 0–149)

## 2022-08-04 LAB — HEMOGLOBIN A1C
Est. average glucose Bld gHb Est-mCnc: 209 mg/dL
Hgb A1c MFr Bld: 8.9 % — ABNORMAL HIGH (ref 4.8–5.6)

## 2022-08-04 LAB — FERRITIN: Ferritin: 120 ng/mL (ref 30–400)

## 2022-08-07 ENCOUNTER — Ambulatory Visit: Payer: PRIVATE HEALTH INSURANCE | Admitting: Family Medicine

## 2022-08-07 VITALS — BP 117/79 | HR 71 | Ht 71.0 in | Wt 243.0 lb

## 2022-08-07 DIAGNOSIS — K760 Fatty (change of) liver, not elsewhere classified: Secondary | ICD-10-CM

## 2022-08-07 DIAGNOSIS — E781 Pure hyperglyceridemia: Secondary | ICD-10-CM

## 2022-08-07 DIAGNOSIS — E785 Hyperlipidemia, unspecified: Secondary | ICD-10-CM

## 2022-08-07 DIAGNOSIS — E1169 Type 2 diabetes mellitus with other specified complication: Secondary | ICD-10-CM

## 2022-08-07 DIAGNOSIS — E669 Obesity, unspecified: Secondary | ICD-10-CM

## 2022-08-07 DIAGNOSIS — E119 Type 2 diabetes mellitus without complications: Secondary | ICD-10-CM

## 2022-08-07 DIAGNOSIS — Z7984 Long term (current) use of oral hypoglycemic drugs: Secondary | ICD-10-CM

## 2022-08-07 NOTE — Progress Notes (Signed)
Subjective:    Patient ID: Blake [name], male    DOB: 1980-05-13, 42 y.o.   MRN: [id number]  HPI Patient arrives today for 3 month follow for blood work. Results for orders placed or performed in visit on 05/07/22  Hemoglobin A1c  Result Value Ref Range   Hgb A1c MFr Bld 8.9 (H) 4.8 - 5.6 %   Est. average glucose Bld gHb Est-mCnc 209 mg/dL  Basic metabolic panel  Result Value Ref Range   Glucose 157 (H) 70 - 99 mg/dL   BUN 9 6 - 20 mg/dL   Creatinine, Ser 9.52 0.76 - 1.27 mg/dL   eGFR 841 >32 GM/WNU/2.72   BUN/Creatinine Ratio 10 9 - 20   Sodium 137 134 - 144 mmol/L   Potassium 4.2 3.5 - 5.2 mmol/L   Chloride 97 96 - 106 mmol/L   CO2 21 20 - 29 mmol/L   Calcium 9.4 8.7 - 10.2 mg/dL  Lipid panel  Result Value Ref Range   Cholesterol, Total 200 (H) 100 - 199 mg/dL   Triglycerides 536 (HH) 0 - 149 mg/dL   HDL 33 (L) >64 mg/dL   VLDL Cholesterol Cal Comment (A) 5 - 40 mg/dL   LDL Chol Calc (NIH) Comment (A) 0 - 99 mg/dL   Chol/HDL Ratio 6.1 (H) 0.0 - 5.0 ratio  Hepatic function panel  Result Value Ref Range   Total Protein 6.8 6.0 - 8.5 g/dL   Albumin 4.5 4.1 - 5.1 g/dL   Bilirubin Total 0.3 0.0 - 1.2 mg/dL   Bilirubin, Direct 4.03 0.00 - 0.40 mg/dL   Alkaline Phosphatase 73 44 - 121 IU/L   AST 29 0 - 40 IU/L   ALT 49 (H) 0 - 44 IU/L  Ferritin  Result Value Ref Range   Ferritin 120 30 - 400 ng/mL   Outpatient Encounter Medications as of 08/07/2022  Medication Sig   aspirin EC 81 MG tablet Take 1 tablet (81 mg total) by mouth daily. Swallow whole.   empagliflozin (JARDIANCE) 25 MG TABS tablet Take 1 tablet (25 mg total) by mouth daily before breakfast.   glucose blood test strip Test glucose once a day DX: E11.9   lamoTRIgine (LAMICTAL) 150 MG tablet Take 1 tablet (150 mg total) by mouth daily.   lisinopril (ZESTRIL) 20 MG tablet Take 1 tablet (20 mg total) by mouth daily.   metFORMIN (GLUCOPHAGE) 500 MG tablet TAKE 2 TABLETS BY MOUTH TWICE  DAILY   rosuvastatin  (CRESTOR) 40 MG tablet Take 1 tablet (40 mg total) by mouth daily.   moxifloxacin (VIGAMOX) 0.5 % ophthalmic solution 1 drop 3 times daily right eye for 5 days   naltrexone (DEPADE) 50 MG tablet Take 1 tablet (50 mg total) by mouth daily.   No facility-administered encounter medications on file as of 08/07/2022.    Review of Systems     Objective:   Physical Exam General-in no acute distress Eyes-no discharge Lungs-respiratory rate normal, CTA CV-no murmurs,RRR Extremities skin warm dry no edema Neuro grossly normal Behavior normal, alert        Assessment & Plan:  1. Hyperlipidemia associated with type 2 diabetes mellitus (HCC) Continue statin Will touch base with clinical pharmacist regarding other options for his hypertriglyceridemia  2. Type 2 diabetes mellitus without complication, without long-term current use of insulin (HCC) A1c higher than what we would like he is diet is not doing well he will work harder on this GLP-1 would be a good choice but  he does have a remote history of pancreatitis when he was drinking alcohol but he is no longer drinking alcohol Will touch base with clinical pharmacy to see if this is a hard stop or not  3. Fatty liver Healthy diet regular physical activity portion control  4. Obesity (BMI 30.0-34.9) Healthy diet regular physical activity portion control  5. Hypertriglyceridemia Please see discussion above  Patient is staying away from alcohol  Patient denies being depressed

## 2022-08-10 ENCOUNTER — Telehealth: Payer: Self-pay

## 2022-08-10 NOTE — Telephone Encounter (Signed)
Patient dropped off document Surgical Clearance, to be filled out by provider. Patient requested to send it via Fax within 7-days. Document is located in providers tray at front office.Please advise at Mobile [phone] (mobile)

## 2022-08-12 ENCOUNTER — Telehealth: Payer: Self-pay | Admitting: Family Medicine

## 2022-08-12 DIAGNOSIS — E119 Type 2 diabetes mellitus without complications: Secondary | ICD-10-CM

## 2022-08-12 DIAGNOSIS — K760 Fatty (change of) liver, not elsewhere classified: Secondary | ICD-10-CM

## 2022-08-12 DIAGNOSIS — E1169 Type 2 diabetes mellitus with other specified complication: Secondary | ICD-10-CM

## 2022-08-12 NOTE — Telephone Encounter (Signed)
Nurses Please let the patient know that we received a request for preoperative risk assessment for a left shoulder scope and rotator cuff repair  Currently his A1c is 8.9 typically orthopedics prefers for the A1c to be in the sevens or less before doing surgery because of the risk of infection We are in the process of connecting with clinical pharmacist regarding additional measures for his diabetes  Currently right now I would not recommend doing this arthroscopic repair until his numbers are looking better Typically once we initiate additional measures to help with the diabetes it can take 3 months to see improvement in the A1c he will be eligible to repeat the A1c at the end of July  Please order lipid, A1c, metabolic 7, liver function for the end of July  Diagnosis poorly controlled diabetes, hyperlipidemia, hypertension  At that time if his lab work looks better we can give the approval.  We should have more information from clinical pharmacist later this week.

## 2022-08-13 NOTE — Telephone Encounter (Signed)
Please see telephone message from yesterday May 5

## 2022-08-14 ENCOUNTER — Other Ambulatory Visit: Payer: Self-pay | Admitting: Family Medicine

## 2022-08-15 NOTE — Telephone Encounter (Signed)
Patient advised of providers recommendations and verbalized understanding and stated the ortho had already informed him of this information. Blood work ordered in [employer].

## 2022-08-23 ENCOUNTER — Encounter: Payer: Self-pay | Admitting: Behavioral Health

## 2022-08-23 ENCOUNTER — Ambulatory Visit: Payer: PRIVATE HEALTH INSURANCE | Admitting: Behavioral Health

## 2022-08-23 DIAGNOSIS — F411 Generalized anxiety disorder: Secondary | ICD-10-CM

## 2022-08-23 DIAGNOSIS — R454 Irritability and anger: Secondary | ICD-10-CM

## 2022-08-23 DIAGNOSIS — F39 Unspecified mood [affective] disorder: Secondary | ICD-10-CM | POA: Diagnosis not present

## 2022-08-23 MED ORDER — LAMOTRIGINE 150 MG PO TABS: 150.0000 mg | ORAL_TABLET | Freq: Every day | ORAL | 1 refills | Status: DC

## 2022-08-23 NOTE — Progress Notes (Signed)
[facility]  Patient ID: Blake [name],  MRN: [id number]  PCP: Babs Sciara, MD  Date of Evaluation: 08/23/2022 Time spent:30 minutes  Chief Complaint:  Chief Complaint   Anxiety; Medication Refill; Follow-up; Patient Education; Irritability     HISTORY/CURRENT STATUS: HPI   42 year old male presents to this office for follow up and medication management. Says moods and irritability have improved significantly. He does not want to change medications at this time.  He is requesting 6 month f/u.  His anxiety today  is 2/10 and depression is 0/10. He sleeps about 7 hours per night now which is also new improvement. He does endorse sleep apnea and wears C-Pap at while sleeping. He denies mania, no psychosis, no SI/HI.   Prior psychiatric medication list:   Wellbutrin Zoloft Prozac Chantix Individual Medical History/ Review of Systems: Changes? :No   Allergies: Penicillins  Current Medications:  Current Outpatient Medications:    aspirin EC 81 MG tablet, Take 1 tablet (81 mg total) by mouth daily. Swallow whole., Disp: 150 tablet, Rfl: 2   empagliflozin (JARDIANCE) 25 MG TABS tablet, Take 1 tablet (25 mg total) by mouth daily before breakfast., Disp: 90 tablet, Rfl: 1   glucose blood test strip, Test glucose once a day DX: E11.9, Disp: 50 each, Rfl: 5   lamoTRIgine (LAMICTAL) 150 MG tablet, Take 1 tablet (150 mg total) by mouth daily., Disp: 90 tablet, Rfl: 1   lisinopril (ZESTRIL) 20 MG tablet, Take 1 tablet (20 mg total) by mouth daily., Disp: 90 tablet, Rfl: 3   metFORMIN (GLUCOPHAGE) 500 MG tablet, TAKE 2 TABLETS BY MOUTH TWICE  DAILY, Disp: 360 tablet, Rfl: 1   moxifloxacin (VIGAMOX) 0.5 % ophthalmic solution, 1 drop 3 times daily right eye for 5 days, Disp: 3 mL, Rfl: 0   rosuvastatin (CRESTOR) 40 MG tablet, TAKE 1 TABLET BY MOUTH DAILY, Disp: 90 tablet, Rfl: 3 Medication Side Effects: none  Family Medical/ Social History: Changes? No  MENTAL HEALTH  EXAM:  There were no vitals taken for this visit.There is no height or weight on file to calculate BMI.  General Appearance: Casual, Neat, and Well Groomed  Eye Contact:  Good  Speech:  Clear and Coherent  Volume:  Normal  Mood:  NA  Affect:  Appropriate  Thought Process:  Coherent  Orientation:  Full (Time, Place, and Person)  Thought Content: Logical   Suicidal Thoughts:  No  Homicidal Thoughts:  No  Memory:  WNL  Judgement:  Good  Insight:  Good  Psychomotor Activity:  Normal  Concentration:  Concentration: Good  Recall:  Good  Fund of Knowledge: Good  Language: Good  Assets:  Desire for Improvement  ADL's:  Intact  Cognition: WNL  Prognosis:  Good    DIAGNOSES:    ICD-10-CM   1. Generalized anxiety disorder  F41.1 lamoTRIgine (LAMICTAL) 150 MG tablet    2. Irritability  R45.4 lamoTRIgine (LAMICTAL) 150 MG tablet    3. Unspecified mood (affective) disorder (HCC)  F39 lamoTRIgine (LAMICTAL) 150 MG tablet      Receiving Psychotherapy: No    RECOMMENDATIONS:   Greater than 50% of 30 min face to face time with patient was spent on counseling and coordination of care. Discussed his slight decline with his moods recently. He is very happy with how Lamictal has helped him but feels like the medication has lost some of its efficacy. He has stopped smoking and no cigarettes or nicotine since last visit.  No medication changes this visit are indicated.    We agreed to;  Continue Lamictal to 150 mg.  Will report worsening symptoms promptly To follow up in 6 months to  reassess per pt Provided emergency contact information Monitor for any sign of rash. Please taking Lamictal and contact office immediately rash develops. Recommend seeking urgent medical attention if rash is severe and/or spreading quickly.  Reviewed PDMP       Joan Flores, NP

## 2022-09-05 LAB — LAB REPORT - SCANNED: A1c: 7.7

## 2022-09-09 ENCOUNTER — Encounter: Payer: Self-pay | Admitting: Family Medicine

## 2022-09-10 ENCOUNTER — Other Ambulatory Visit: Payer: Self-pay | Admitting: Family Medicine

## 2022-09-12 ENCOUNTER — Other Ambulatory Visit: Payer: Self-pay | Admitting: Family Medicine

## 2022-09-12 MED ORDER — TIRZEPATIDE 2.5 MG/0.5ML ~~LOC~~ SOAJ: 2.5000 mg | SUBCUTANEOUS | 3 refills | Status: DC

## 2022-09-12 MED ORDER — ICOSAPENT ETHYL 1 G PO CAPS: 1.0000 g | ORAL_CAPSULE | Freq: Two times a day (BID) | ORAL | 5 refills | Status: DC

## 2022-09-22 ENCOUNTER — Ambulatory Visit
Admission: EM | Admit: 2022-09-22 | Discharge: 2022-09-22 | Disposition: A | Discharge status: Home / Self Care | Payer: PRIVATE HEALTH INSURANCE | Attending: Family Medicine | Admitting: Family Medicine

## 2022-09-22 DIAGNOSIS — H0289 Other specified disorders of eyelid: Secondary | ICD-10-CM | POA: Diagnosis not present

## 2022-09-22 MED ORDER — TOBRAMYCIN 0.3 % OP SOLN: 1.0000 [drp] | Freq: Four times a day (QID) | OPHTHALMIC | 0 refills | Status: DC

## 2022-09-22 NOTE — ED Triage Notes (Signed)
Pt reports he has right eye redness, itching and pain x 6 weeks. Tried allergy drops.   Pt states he has visual disturbances at times.

## 2022-09-23 ENCOUNTER — Encounter: Payer: Self-pay | Admitting: Family Medicine

## 2022-09-24 ENCOUNTER — Other Ambulatory Visit: Payer: Self-pay

## 2022-09-24 ENCOUNTER — Encounter (HOSPITAL_COMMUNITY): Payer: Self-pay | Admitting: Occupational Therapy

## 2022-09-24 ENCOUNTER — Ambulatory Visit (HOSPITAL_COMMUNITY): Payer: PRIVATE HEALTH INSURANCE | Attending: Orthopaedic Surgery | Admitting: Occupational Therapy

## 2022-09-24 DIAGNOSIS — M25512 Pain in left shoulder: Secondary | ICD-10-CM | POA: Diagnosis present

## 2022-09-24 DIAGNOSIS — R29898 Other symptoms and signs involving the musculoskeletal system: Secondary | ICD-10-CM | POA: Insufficient documentation

## 2022-09-24 DIAGNOSIS — M25612 Stiffness of left shoulder, not elsewhere classified: Secondary | ICD-10-CM | POA: Insufficient documentation

## 2022-09-24 NOTE — Therapy (Signed)
OUTPATIENT OCCUPATIONAL THERAPY ORTHO EVALUATION  Patient Name: Blake [name] MRN: [id number] DOB:04-15-1980, 42 y.o., male Today's Date: 09/24/2022  PCP: Babs Sciara, MD REFERRING PROVIDER: Ramond Marrow, MD  END OF SESSION:  OT End of Session - 09/24/22 1144     Visit Number 1    Number of Visits 17    Date for OT Re-Evaluation 11/30/22    Authorization Type [employer]    Progress Note Due on Visit 10    OT Start Time 0947    OT Stop Time 1026    OT Time Calculation (min) 39 min    Activity Tolerance Patient tolerated treatment well    Behavior During Therapy [facility] for tasks assessed/performed             Past Medical History:  Diagnosis Date   Depression    Elevated ferritin 06/25/2019   High blood cholesterol level    Hypertension    Sleep apnea 01/07/2013   cpap   TIA (transient ischemic attack)    Tobacco abuse 11/19/2018   Patient working very hard at quitting   Type 2 diabetes mellitus (HCC) 11/19/2018   Past Surgical History:  Procedure Laterality Date   ABDOMINAL SURGERY     APPENDECTOMY     CHOLECYSTECTOMY     Patient Active Problem List   Diagnosis Date Noted   Depression, major, single episode, moderate (HCC) 05/07/2022   Transient ischemic attack (TIA) 09/25/2021   Essential hypertension 09/25/2021   Umbilical hernia without obstruction and without gangrene 02/21/2021   Elevated ferritin 06/25/2019   Type 2 diabetes mellitus (HCC) 11/19/2018   Tobacco abuse 11/19/2018   Depression, major, single episode, mild (HCC) 11/21/2016   Obesity (BMI 30.0-34.9) 05/19/2016   Obstructive sleep apnea 10/27/2013   Fatty liver 10/27/2013   Hypertriglyceridemia 12/15/2012    ONSET DATE: 09/19/22  REFERRING DIAG: L shoulder arthroscopy and Bicep Tenodesis  THERAPY DIAG:  Acute pain of left shoulder  Stiffness of left shoulder, not elsewhere classified  Other symptoms and signs involving the musculoskeletal system  Rationale for Evaluation and  Treatment: Rehabilitation  SUBJECTIVE:   SUBJECTIVE STATEMENT: "I've been afraid to move my arm at all." Pt accompanied by: self and significant other  PERTINENT HISTORY: PMH significant for HTN, Depression, TIA, DM2  PRECAUTIONS: Shoulder  WEIGHT BEARING RESTRICTIONS: Yes No weight  PAIN:  Are you having pain? Yes: NPRS scale: 3/10 Pain location: Anterior shoulder girdle Pain description: radiating Aggravating factors: moving the arm out Relieving factors: Motrin, Celebrex, Tylenol   FALLS: Has patient fallen in last 6 months? No  LIVING ENVIRONMENT: Lives with: lives with their family and lives with their spouse Lives in: House/apartment  PLOF: Independent  PATIENT GOALS: To get back to my old self  NEXT MD VISIT: 10/02/22  OBJECTIVE:   HAND DOMINANCE: Right  ADLs: Overall ADLs: Mod Assist for all BADL's and Total assist for all IADL's. Unable to lift or carry any items at this time.   FUNCTIONAL OUTCOME MEASURES: FOTO: Will Complete next Session  UPPER EXTREMITY ROM:     Passive ROM Left eval  Shoulder flexion 119  Shoulder abduction 104  Shoulder internal rotation 90  Shoulder external rotation 13  Elbow flexion 138  Elbow extension -8  (Blank rows = not tested)  UPPER EXTREMITY MMT:     MMT Left eval  Shoulder flexion   Shoulder abduction   Shoulder adduction   Shoulder extension   Shoulder internal rotation   Shoulder external rotation  Elbow flexion   Elbow extension   (Blank rows = not tested)  SENSATION: WFL  EDEMA: None noted this session  OBSERVATIONS: Moderate to severe fascial restrictions noted in the biceps, anterior shoulder girdle, trapezius, and scapular region.   TODAY'S TREATMENT:                                                                                                                              DATE: 09/24/22: Evaluation Only    PATIENT EDUCATION: Education details: Elbow AA/ROM, Wrist and Hand ROM Person  educated: Patient Education method: Explanation, Demonstration, and Handouts Education comprehension: verbalized understanding and returned demonstration  HOME EXERCISE PROGRAM: 6/17: Elbow AA/ROM, Wrist and Hand ROM  GOALS: Goals reviewed with patient? Yes  SHORT TERM GOALS: Target date: 10/26/22  Pt will be provided with and educated on HEP to improve mobility in LUE required for use during ADL completion.   Goal status: INITIAL  2.  Pt will increase LUE P/ROM by 50 degrees to improve ability to use LUE during dressing tasks with minimal compensatory techniques.    Goal status: INITIAL  3.  Pt will increase LUE strength to 3+/5 to improve ability to reach for items at waist to chest height during bathing and grooming tasks.   Goal status: INITIAL   LONG TERM GOALS: Target date: 11/30/22  Pt will decrease pain in LUE to 3/10 or less to improve ability to sleep for 2+ consecutive hours without waking due to pain.   Goal status: INITIAL  2.  Pt will decrease LUE fascial restrictions to min amounts or less to improve mobility required for functional reaching tasks.    Goal status: INITIAL  3.  Pt will increase LUE A/ROM to [facility] to improve ability to use LUE when reaching overhead or behind back during dressing and bathing tasks.  Goal status: INITIAL  4.   Pt will increase LUE strength to 5/5 or greater to improve ability to use LUE when lifting or carrying items during meal preparation/housework/yardwork tasks.   Goal status: INITIAL  5.  Pt will return to highest level of function using LUE as dominant during functional task completion.  Goal status: INITIAL   ASSESSMENT:  CLINICAL IMPRESSION: Patient is a 42 y.o. male who was seen today for occupational therapy evaluation for L shoulder arthroscopy and bicep tenodesis with rotator cuff repair. Pt presents in an abduction sling, 5 days post op and has not started low level elbow and wrist ROM.  PERFORMANCE DEFICITS:  in functional skills including ADLs, IADLs, ROM, strength, pain, fascial restrictions, Fine motor control, Gross motor control, body mechanics, and UE functional use.  IMPAIRMENTS: are limiting patient from ADLs, IADLs, rest and sleep, work, leisure, and social participation.   COMORBIDITIES: has no other co-morbidities that affects occupational performance. Patient will benefit from skilled OT to address above impairments and improve overall function.  MODIFICATION OR ASSISTANCE TO COMPLETE EVALUATION: No modification of tasks or  assist necessary to complete an evaluation.  OT OCCUPATIONAL PROFILE AND HISTORY: Problem focused assessment: Including review of records relating to presenting problem.  CLINICAL DECISION MAKING: LOW - limited treatment options, no task modification necessary  REHAB POTENTIAL: Good  EVALUATION COMPLEXITY: Low      PLAN:  OT FREQUENCY: 2x/week  OT DURATION: 8 weeks  PLANNED INTERVENTIONS: self care/ADL training, therapeutic exercise, therapeutic activity, manual therapy, passive range of motion, functional mobility training, electrical stimulation, ultrasound, moist heat, patient/family education, coping strategies training, and DME and/or AE instructions  RECOMMENDED OTHER SERVICES: N/A  CONSULTED AND AGREED WITH PLAN OF CARE: Patient  PLAN FOR NEXT SESSION: Manual Therapy, P/ROM, Table Slides, Shelly Bombard, thumb tacs   Trish Mage, OTR/L [facility] [phone] Kennyth Arnold, OT 09/24/2022, 11:49 AM

## 2022-09-24 NOTE — ED Provider Notes (Signed)
[facility]   [id number] 09/22/22 Arrival Time: 1405  ASSESSMENT & PLAN:  1. Irritation of eyelid    Recommend ophthalmology evaluation. Question tear duct related. Begin: Meds ordered this encounter  Medications   tobramycin (TOBREX) 0.3 % ophthalmic solution    Sig: Place 1 drop into the right eye every 6 (six) hours.    Dispense:  5 mL    Refill:  0    Antibiotics per orders. Warm compress to eye(s). Local eye care discussed.  Reviewed expectations re: course of current medical issues. Questions answered. Outlined signs and symptoms indicating need for more acute intervention. Patient verbalized understanding. After Visit Summary given.   SUBJECTIVE:  Blake [name] is a 42 y.o. male who presents with complaint of right eye redness, itching and pain x 6 weeks. More his lower eyelid toward medial eye. Occas thick drainage from medial eye. Does not wear contacts. No visual changes. Tried allergy drops without any relief. Denies eye injury.  OBJECTIVE:  Vitals:   09/22/22 1548  BP: (!) 149/86  Pulse: 78  Resp: 18  Temp: 97.9 F (36.6 C)  TempSrc: Oral  SpO2: 96%    General appearance: alert; no distress HEENT: [place]; AT; PERRLA; no restriction of the extraocular movements OU: without reported pain; without conjunctival injection; without drainage; without corneal opacities; without limbal flush; without periorbital swelling or erythema; moderate inflammation of lower eyelid, more at medial eye on the right Neck: supple without LAD Skin: warm and dry Psychological: alert and cooperative; normal mood and affect    Allergies  Allergen Reactions   Penicillins Other (See Comments)    Unknown childhood reaction    Past Medical History:  Diagnosis Date   Depression    Elevated ferritin 06/25/2019   High blood cholesterol level    Hypertension    Sleep apnea 01/07/2013   cpap   TIA (transient ischemic attack)    Tobacco abuse 11/19/2018    Patient working very hard at quitting   Type 2 diabetes mellitus (HCC) 11/19/2018   Social History   Socioeconomic History   Marital status: Married    Spouse name: Not on file   Number of children: Not on file   Years of education: Not on file   Highest education level: Associate degree: occupational, Scientist, product/process development, or vocational program  Occupational History   Not on file  Tobacco Use   Smoking status: Former    Packs/day: 0.50    Years: 15.00    Additional pack years: 0.00    Total pack years: 7.50    Types: Cigarettes    Quit date: 09/07/2021    Years since quitting: 1.0   Smokeless tobacco: Never   Tobacco comments:    Started smoking again around April 2020  Vaping Use   Vaping Use: Former  Substance and Sexual Activity   Alcohol use: Yes    Alcohol/week: 0.0 standard drinks of alcohol    Comment: occas   Drug use: No   Sexual activity: Yes  Other Topics Concern   Not on file  Social History Narrative   Live with wife and two children in [place]. Like woodworking in free time.      Right handed   Caffeine: 1 cup/day   Social Determinants of Health   Financial Resource Strain: Low Risk  (08/06/2022)   Overall Financial Resource Strain (CARDIA)    Difficulty of Paying Living Expenses: Not hard at all  Food Insecurity: No Food Insecurity (08/06/2022)  Hunger Vital Sign    Worried About Running Out of Food in the Last Year: Never true    Ran Out of Food in the Last Year: Never true  Transportation Needs: No Transportation Needs (08/06/2022)   PRAPARE - Administrator, Civil Service (Medical): No    Lack of Transportation (Non-Medical): No  Physical Activity: Insufficiently Active (08/06/2022)   Exercise Vital Sign    Days of Exercise per Week: 2 days    Minutes of Exercise per Session: 20 min  Stress: No Stress Concern Present (08/06/2022)   [employer] of Occupational Health - Occupational Stress Questionnaire    Feeling of Stress : Only a  little  Social Connections: Socially Integrated (08/06/2022)   Social Connection and Isolation Panel [NHANES]    Frequency of Communication with Friends and Family: More than three times a week    Frequency of Social Gatherings with Friends and Family: Twice a week    Attends Religious Services: More than 4 times per year    Active Member of [employer] or Organizations: Yes    Attends Engineer, structural: More than 4 times per year    Marital Status: Married  Catering manager Violence: Not on file   Family History  Problem Relation Age of Onset   Schizophrenia Father    Alcohol abuse Father    Diabetes Father    Alcohol abuse Maternal Grandfather    Alcohol abuse Paternal Uncle    Sleep apnea Neg Hx    Past Surgical History:  Procedure Laterality Date   ABDOMINAL SURGERY     APPENDECTOMY     Barrington Ellison, MD 09/24/22 [phone]

## 2022-10-02 ENCOUNTER — Ambulatory Visit (HOSPITAL_COMMUNITY): Payer: PRIVATE HEALTH INSURANCE | Admitting: Occupational Therapy

## 2022-10-02 ENCOUNTER — Encounter (HOSPITAL_COMMUNITY): Payer: Self-pay | Admitting: Occupational Therapy

## 2022-10-02 DIAGNOSIS — R29898 Other symptoms and signs involving the musculoskeletal system: Secondary | ICD-10-CM

## 2022-10-02 DIAGNOSIS — M25512 Pain in left shoulder: Secondary | ICD-10-CM | POA: Diagnosis not present

## 2022-10-02 DIAGNOSIS — M25612 Stiffness of left shoulder, not elsewhere classified: Secondary | ICD-10-CM

## 2022-10-02 NOTE — Therapy (Signed)
OUTPATIENT OCCUPATIONAL THERAPY ORTHO TREATMENT NOTE  Patient Name: Blake [name] MRN: [id number] DOB:[birth year], 42 y.o., male Today's Date: 10/02/2022  PCP: Babs Sciara, MD REFERRING PROVIDER: Ramond Marrow, MD  END OF SESSION:  OT End of Session - 10/02/22 1549     Visit Number 2    Number of Visits 17    Date for OT Re-Evaluation 11/30/22    Authorization Type [employer]    Progress Note Due on Visit 10    OT Start Time 1306    OT Stop Time 1401    OT Time Calculation (min) 55 min    Activity Tolerance Patient tolerated treatment well    Behavior During Therapy [facility] for tasks assessed/performed              Past Medical History:  Diagnosis Date   Depression    Elevated ferritin 06/25/2019   High blood cholesterol level    Hypertension    Sleep apnea 01/07/2013   cpap   TIA (transient ischemic attack)    Tobacco abuse 11/19/2018   Patient working very hard at quitting   Type 2 diabetes mellitus (HCC) 11/19/2018   Past Surgical History:  Procedure Laterality Date   ABDOMINAL SURGERY     APPENDECTOMY     CHOLECYSTECTOMY     Patient Active Problem List   Diagnosis Date Noted   Depression, major, single episode, moderate (HCC) 05/07/2022   Transient ischemic attack (TIA) 09/25/2021   Essential hypertension 09/25/2021   Umbilical hernia without obstruction and without gangrene 02/21/2021   Elevated ferritin 06/25/2019   Type 2 diabetes mellitus (HCC) 11/19/2018   Tobacco abuse 11/19/2018   Depression, major, single episode, mild (HCC) 11/21/2016   Obesity (BMI 30.0-34.9) 05/19/2016   Obstructive sleep apnea 10/27/2013   Fatty liver 10/27/2013   Hypertriglyceridemia 12/15/2012    ONSET DATE: 09/19/22  REFERRING DIAG: L shoulder arthroscopy and Bicep Tenodesis  THERAPY DIAG:  Acute pain of left shoulder  Stiffness of left shoulder, not elsewhere classified  Other symptoms and signs involving the musculoskeletal system  Rationale for Evaluation  and Treatment: Rehabilitation  SUBJECTIVE:   SUBJECTIVE STATEMENT: "It's been sore and I can't sleep in the bed yet." Pt accompanied by: self and significant other  PERTINENT HISTORY: PMH significant for HTN, Depression, TIA, DM2  PRECAUTIONS: Shoulder  WEIGHT BEARING RESTRICTIONS: Yes No weight  PAIN:  Are you having pain? Yes: NPRS scale: 5/10 Pain location: Anterior shoulder girdle Pain description: radiating Aggravating factors: moving the arm out Relieving factors: Motrin, Celebrex, Tylenol   FALLS: Has patient fallen in last 6 months? No  LIVING ENVIRONMENT: Lives with: lives with their family and lives with their spouse Lives in: House/apartment  PLOF: Independent  PATIENT GOALS: To get back to my old self  NEXT MD VISIT: 10/02/22  OBJECTIVE:   HAND DOMINANCE: Right  ADLs: Overall ADLs: Mod Assist for all BADL's and Total assist for all IADL's. Unable to lift or carry any items at this time.   FUNCTIONAL OUTCOME MEASURES: FOTO: Will Complete next Session  UPPER EXTREMITY ROM:     Passive ROM Left eval  Shoulder flexion 119  Shoulder abduction 104  Shoulder internal rotation 90  Shoulder external rotation 13  Elbow flexion 138  Elbow extension -8  (Blank rows = not tested)  UPPER EXTREMITY MMT:     MMT Left eval  Shoulder flexion   Shoulder abduction   Shoulder adduction   Shoulder extension   Shoulder internal rotation  Shoulder external rotation   Elbow flexion   Elbow extension   (Blank rows = not tested)  SENSATION: WFL  EDEMA: None noted this session  OBSERVATIONS: Moderate to severe fascial restrictions noted in the biceps, anterior shoulder girdle, trapezius, and scapular region.   TODAY'S TREATMENT:                                                                                                                              DATE:   10/02/22  -Manual Therapy: myofascial release and trigger point applied to biceps, deltoid,  scapular region, and trapezius in order to reduce pain and fascial restrictions to improve ROM -P/ROM: supine, flexion, abduction, horizontal abduction, er/IR, x10 -Ball Rolls: flexion and abduction x10 -Pendulums 2x45 -Doffing and donning sling    PATIENT EDUCATION: Education details: Table Slides and Pendulums Person educated: Patient Education method: Explanation, Demonstration, and Handouts Education comprehension: verbalized understanding and returned demonstration  HOME EXERCISE PROGRAM: 6/17: Elbow AA/ROM, Wrist and Hand ROM 6/25: Table Slides and Pendulums  GOALS: Goals reviewed with patient? Yes  SHORT TERM GOALS: Target date: 10/26/22  Pt will be provided with and educated on HEP to improve mobility in LUE required for use during ADL completion.   Goal status: IN PROGRESS  2.  Pt will increase LUE P/ROM by 50 degrees to improve ability to use LUE during dressing tasks with minimal compensatory techniques.    Goal status: IN PROGRESS  3.  Pt will increase LUE strength to 3+/5 to improve ability to reach for items at waist to chest height during bathing and grooming tasks.   Goal status: IN PROGRESS   LONG TERM GOALS: Target date: 11/30/22  Pt will decrease pain in LUE to 3/10 or less to improve ability to sleep for 2+ consecutive hours without waking due to pain.   Goal status: IN PROGRESS  2.  Pt will decrease LUE fascial restrictions to min amounts or less to improve mobility required for functional reaching tasks.    Goal status: IN PROGRESS  3.  Pt will increase LUE A/ROM to [facility] to improve ability to use LUE when reaching overhead or behind back during dressing and bathing tasks.  Goal status: IN PROGRESS  4.   Pt will increase LUE strength to 5/5 or greater to improve ability to use LUE when lifting or carrying items during meal preparation/housework/yardwork tasks.   Goal status: IN PROGRESS  5.  Pt will return to highest level of function using LUE  as dominant during functional task completion.  Goal status: IN PROGRESS   ASSESSMENT:  CLINICAL IMPRESSION: This session OT initiated manual therapy and P/ROM. Initially he presented with increased stiffness and tension, however with increased repetitions and verbal cuing to breath and relax, he was able to achieve 75% of full P/ROM. When he is relaxing and working on breathing, his pain level also decreases. Verbal and tactile cuing provided for positioning and technique.   PERFORMANCE  DEFICITS: in functional skills including ADLs, IADLs, ROM, strength, pain, fascial restrictions, Fine motor control, Gross motor control, body mechanics, and UE functional use.   PLAN:  OT FREQUENCY: 2x/week  OT DURATION: 8 weeks  PLANNED INTERVENTIONS: self care/ADL training, therapeutic exercise, therapeutic activity, manual therapy, passive range of motion, functional mobility training, electrical stimulation, ultrasound, moist heat, patient/family education, coping strategies training, and DME and/or AE instructions  RECOMMENDED OTHER SERVICES: N/A  CONSULTED AND AGREED WITH PLAN OF CARE: Patient  PLAN FOR NEXT SESSION: Manual Therapy, P/ROM, Table Slides, Shelly Bombard, thumb tacs   Trish Mage, OTR/L [facility] [phone] Kennyth Arnold, OT 10/02/2022, 3:51 PM

## 2022-10-02 NOTE — Patient Instructions (Signed)

## 2022-10-04 ENCOUNTER — Ambulatory Visit (HOSPITAL_COMMUNITY): Payer: PRIVATE HEALTH INSURANCE | Admitting: Occupational Therapy

## 2022-10-04 DIAGNOSIS — M25512 Pain in left shoulder: Secondary | ICD-10-CM

## 2022-10-04 DIAGNOSIS — R29898 Other symptoms and signs involving the musculoskeletal system: Secondary | ICD-10-CM

## 2022-10-04 DIAGNOSIS — M25612 Stiffness of left shoulder, not elsewhere classified: Secondary | ICD-10-CM

## 2022-10-04 NOTE — Therapy (Signed)
OUTPATIENT OCCUPATIONAL THERAPY ORTHO TREATMENT NOTE  Patient Name: Blake [name] MRN: [id number] DOB:03/14/1981, 42 y.o., male Today's Date: 10/04/2022  PCP: Babs Sciara, MD REFERRING PROVIDER: Ramond Marrow, MD  END OF SESSION:  OT End of Session - 10/04/22 1440     Visit Number 3    Number of Visits 17    Date for OT Re-Evaluation 11/30/22    Authorization Type [employer]    Progress Note Due on Visit 10    OT Start Time 0906    OT Stop Time 0949    OT Time Calculation (min) 43 min    Activity Tolerance Patient tolerated treatment well    Behavior During Therapy [facility] for tasks assessed/performed             Past Medical History:  Diagnosis Date   Depression    Elevated ferritin 06/25/2019   High blood cholesterol level    Hypertension    Sleep apnea 01/07/2013   cpap   TIA (transient ischemic attack)    Tobacco abuse 11/19/2018   Patient working very hard at quitting   Type 2 diabetes mellitus (HCC) 11/19/2018   Past Surgical History:  Procedure Laterality Date   ABDOMINAL SURGERY     APPENDECTOMY     CHOLECYSTECTOMY     Patient Active Problem List   Diagnosis Date Noted   Depression, major, single episode, moderate (HCC) 05/07/2022   Transient ischemic attack (TIA) 09/25/2021   Essential hypertension 09/25/2021   Umbilical hernia without obstruction and without gangrene 02/21/2021   Elevated ferritin 06/25/2019   Type 2 diabetes mellitus (HCC) 11/19/2018   Tobacco abuse 11/19/2018   Depression, major, single episode, mild (HCC) 11/21/2016   Obesity (BMI 30.0-34.9) 05/19/2016   Obstructive sleep apnea 10/27/2013   Fatty liver 10/27/2013   Hypertriglyceridemia 12/15/2012    ONSET DATE: 09/19/22  REFERRING DIAG: L shoulder arthroscopy and Bicep Tenodesis  THERAPY DIAG:  Acute pain of left shoulder  Stiffness of left shoulder, not elsewhere classified  Other symptoms and signs involving the musculoskeletal system  Rationale for Evaluation  and Treatment: Rehabilitation  SUBJECTIVE:   SUBJECTIVE STATEMENT: "I feel like I can go further when I'm doing my exercises" Pt accompanied by: self and significant other  PERTINENT HISTORY: PMH significant for HTN, Depression, TIA, DM2  PRECAUTIONS: Shoulder  WEIGHT BEARING RESTRICTIONS: Yes No weight  PAIN:  Are you having pain? Yes: NPRS scale: 2/10 Pain location: Anterior shoulder girdle Pain description: sore Aggravating factors: moving the arm out Relieving factors: Motrin, Celebrex, Tylenol   FALLS: Has patient fallen in last 6 months? No  LIVING ENVIRONMENT: Lives with: lives with their family and lives with their spouse Lives in: House/apartment  PLOF: Independent  PATIENT GOALS: To get back to my old self  NEXT MD VISIT: 10/02/22  OBJECTIVE:   HAND DOMINANCE: Right  ADLs: Overall ADLs: Mod Assist for all BADL's and Total assist for all IADL's. Unable to lift or carry any items at this time.   FUNCTIONAL OUTCOME MEASURES: FOTO: Will Complete next Session  UPPER EXTREMITY ROM:     Passive ROM Left eval  Shoulder flexion 119  Shoulder abduction 104  Shoulder internal rotation 90  Shoulder external rotation 13  Elbow flexion 138  Elbow extension -8  (Blank rows = not tested)  UPPER EXTREMITY MMT:     MMT Left eval  Shoulder flexion   Shoulder abduction   Shoulder adduction   Shoulder extension   Shoulder internal rotation  Shoulder external rotation   Elbow flexion   Elbow extension   (Blank rows = not tested)  SENSATION: WFL  EDEMA: None noted this session  OBSERVATIONS: Moderate to severe fascial restrictions noted in the biceps, anterior shoulder girdle, trapezius, and scapular region.   TODAY'S TREATMENT:                                                                                                                              DATE:   10/04/22 -Manual Therapy: myofascial release and trigger point applied to biceps, deltoid,  scapular region, and trapezius in order to reduce pain and fascial restrictions to improve ROM -P/ROM: supine, flexion, abduction, horizontal abduction, er/IR, x10 -AA/ROM: supine, flexion, abduction, horizontal abduction, er/IR, x10 -Thumb tacs x60" -Pulleys: flexion and abduction x60"  10/02/22  -Manual Therapy: myofascial release and trigger point applied to biceps, deltoid, scapular region, and trapezius in order to reduce pain and fascial restrictions to improve ROM -P/ROM: supine, flexion, abduction, horizontal abduction, er/IR, x10 -Ball Rolls: flexion and abduction x10 -Pendulums 2x45 -Doffing and donning sling    PATIENT EDUCATION: Education details: AA.ROM Person educated: Patient Education method: Programmer, multimedia, Demonstration, and Handouts Education comprehension: verbalized understanding and returned demonstration  HOME EXERCISE PROGRAM: 6/17: Elbow AA/ROM, Wrist and Hand ROM 6/25: Table Slides and Pendulums 6/27: AA/ROM  GOALS: Goals reviewed with patient? Yes  SHORT TERM GOALS: Target date: 10/26/22  Pt will be provided with and educated on HEP to improve mobility in LUE required for use during ADL completion.   Goal status: IN PROGRESS  2.  Pt will increase LUE P/ROM by 50 degrees to improve ability to use LUE during dressing tasks with minimal compensatory techniques.    Goal status: IN PROGRESS  3.  Pt will increase LUE strength to 3+/5 to improve ability to reach for items at waist to chest height during bathing and grooming tasks.   Goal status: IN PROGRESS   LONG TERM GOALS: Target date: 11/30/22  Pt will decrease pain in LUE to 3/10 or less to improve ability to sleep for 2+ consecutive hours without waking due to pain.   Goal status: IN PROGRESS  2.  Pt will decrease LUE fascial restrictions to min amounts or less to improve mobility required for functional reaching tasks.    Goal status: IN PROGRESS  3.  Pt will increase LUE A/ROM to [facility] to  improve ability to use LUE when reaching overhead or behind back during dressing and bathing tasks.  Goal status: IN PROGRESS  4.   Pt will increase LUE strength to 5/5 or greater to improve ability to use LUE when lifting or carrying items during meal preparation/housework/yardwork tasks.   Goal status: IN PROGRESS  5.  Pt will return to highest level of function using LUE as dominant during functional task completion.  Goal status: IN PROGRESS   ASSESSMENT:  CLINICAL IMPRESSION: Pt presenting with improved fascial restrictions and ability to relax  through P/ROM. With P/ROM he achieved approximately 80% of full ROM, except with ER where he was only reaching 20 degrees due to pain. OT then initiated AA/ROM following his protocol, where he was able to achieve 85% of full ROM, even with ER. Verbal and tactile cuing provided for positioning and technique, with pt actively following breathing techniques discussed last session for relaxation.   PERFORMANCE DEFICITS: in functional skills including ADLs, IADLs, ROM, strength, pain, fascial restrictions, Fine motor control, Gross motor control, body mechanics, and UE functional use.   PLAN:  OT FREQUENCY: 2x/week  OT DURATION: 8 weeks  PLANNED INTERVENTIONS: self care/ADL training, therapeutic exercise, therapeutic activity, manual therapy, passive range of motion, functional mobility training, electrical stimulation, ultrasound, moist heat, patient/family education, coping strategies training, and DME and/or AE instructions  RECOMMENDED OTHER SERVICES: N/A  CONSULTED AND AGREED WITH PLAN OF CARE: Patient  PLAN FOR NEXT SESSION: Manual Therapy, P/ROM, Table Slides, Shelly Bombard, thumb tacs   Trish Mage, OTR/L [facility] Out[facility] [phone] Levon Boettcher Rosemarie Beath, OT 10/04/2022, 2:52 PM

## 2022-10-04 NOTE — Patient Instructions (Signed)

## 2022-10-08 ENCOUNTER — Encounter: Payer: Self-pay | Admitting: Family Medicine

## 2022-10-09 ENCOUNTER — Other Ambulatory Visit: Payer: Self-pay | Admitting: Family Medicine

## 2022-10-10 ENCOUNTER — Encounter (HOSPITAL_COMMUNITY): Payer: Self-pay | Admitting: Occupational Therapy

## 2022-10-10 ENCOUNTER — Ambulatory Visit (HOSPITAL_COMMUNITY): Payer: 59 | Attending: Orthopaedic Surgery | Admitting: Occupational Therapy

## 2022-10-10 DIAGNOSIS — R29898 Other symptoms and signs involving the musculoskeletal system: Secondary | ICD-10-CM | POA: Diagnosis present

## 2022-10-10 DIAGNOSIS — M25512 Pain in left shoulder: Secondary | ICD-10-CM | POA: Diagnosis present

## 2022-10-10 DIAGNOSIS — M25612 Stiffness of left shoulder, not elsewhere classified: Secondary | ICD-10-CM | POA: Insufficient documentation

## 2022-10-10 NOTE — Patient Instructions (Signed)

## 2022-10-10 NOTE — Therapy (Signed)
OUTPATIENT OCCUPATIONAL THERAPY ORTHO TREATMENT NOTE  Patient Name: Blake [name] MRN: [id number] DOB:1980-06-21, 42 y.o., male Today's Date: 10/10/2022  PCP: Babs Sciara, MD REFERRING PROVIDER: Ramond Marrow, MD  END OF SESSION:  OT End of Session - 10/10/22 1658     Visit Number 4    Number of Visits 17    Date for OT Re-Evaluation 11/30/22    Authorization Type [employer]    Progress Note Due on Visit 10    OT Start Time 1603    OT Stop Time 1647    OT Time Calculation (min) 44 min    Activity Tolerance Patient tolerated treatment well    Behavior During Therapy [facility] for tasks assessed/performed              Past Medical History:  Diagnosis Date   Depression    Elevated ferritin 06/25/2019   High blood cholesterol level    Hypertension    Sleep apnea 01/07/2013   cpap   TIA (transient ischemic attack)    Tobacco abuse 11/19/2018   Patient working very hard at quitting   Type 2 diabetes mellitus (HCC) 11/19/2018   Past Surgical History:  Procedure Laterality Date   ABDOMINAL SURGERY     APPENDECTOMY     CHOLECYSTECTOMY     Patient Active Problem List   Diagnosis Date Noted   Depression, major, single episode, moderate (HCC) 05/07/2022   Transient ischemic attack (TIA) 09/25/2021   Essential hypertension 09/25/2021   Umbilical hernia without obstruction and without gangrene 02/21/2021   Elevated ferritin 06/25/2019   Type 2 diabetes mellitus (HCC) 11/19/2018   Tobacco abuse 11/19/2018   Depression, major, single episode, mild (HCC) 11/21/2016   Obesity (BMI 30.0-34.9) 05/19/2016   Obstructive sleep apnea 10/27/2013   Fatty liver 10/27/2013   Hypertriglyceridemia 12/15/2012    ONSET DATE: 09/19/22  REFERRING DIAG: L shoulder arthroscopy and Bicep Tenodesis  THERAPY DIAG:  Acute pain of left shoulder  Stiffness of left shoulder, not elsewhere classified  Other symptoms and signs involving the musculoskeletal system  Rationale for Evaluation  and Treatment: Rehabilitation  SUBJECTIVE:   SUBJECTIVE STATEMENT: "I'm ready to get rid of the sling" Pt accompanied by: self and significant other  PERTINENT HISTORY: PMH significant for HTN, Depression, TIA, DM2  PRECAUTIONS: Shoulder  WEIGHT BEARING RESTRICTIONS: Yes No weight  PAIN:  Are you having pain? Yes: NPRS scale: 4/10 Pain location: Anterior shoulder girdle Pain description: sore Aggravating factors: moving the arm out Relieving factors: Motrin, Celebrex, Tylenol   FALLS: Has patient fallen in last 6 months? No  LIVING ENVIRONMENT: Lives with: lives with their family and lives with their spouse Lives in: House/apartment  PLOF: Independent  PATIENT GOALS: To get back to my old self  NEXT MD VISIT: 10/02/22  OBJECTIVE:   HAND DOMINANCE: Right  ADLs: Overall ADLs: Mod Assist for all BADL's and Total assist for all IADL's. Unable to lift or carry any items at this time.   FUNCTIONAL OUTCOME MEASURES: FOTO: 44.23  UPPER EXTREMITY ROM:     Passive ROM Left eval  Shoulder flexion 119  Shoulder abduction 104  Shoulder internal rotation 90  Shoulder external rotation 13  Elbow flexion 138  Elbow extension -8  (Blank rows = not tested)  UPPER EXTREMITY MMT:     MMT Left eval  Shoulder flexion   Shoulder abduction   Shoulder adduction   Shoulder extension   Shoulder internal rotation   Shoulder external rotation  Elbow flexion   Elbow extension   (Blank rows = not tested)  SENSATION: WFL  EDEMA: None noted this session  OBSERVATIONS: Moderate to severe fascial restrictions noted in the biceps, anterior shoulder girdle, trapezius, and scapular region.   TODAY'S TREATMENT:                                                                                                                              DATE:   10/10/22 -Manual Therapy: myofascial release and trigger point applied to biceps, deltoid, scapular region, and trapezius in order to  reduce pain and fascial restrictions to improve ROM -AA/ROM: seated, flexion, abduction, protraction, horizontal abduction, er/IR, x15 -A/ROM: seated, flexion, abduction, protraction, horizontal abduction, er/IR, x10 -Wall Slides: flexion, abduction, x10  10/04/22 -Manual Therapy: myofascial release and trigger point applied to biceps, deltoid, scapular region, and trapezius in order to reduce pain and fascial restrictions to improve ROM -P/ROM: supine, flexion, abduction, horizontal abduction, er/IR, x10 -AA/ROM: supine, flexion, abduction, horizontal abduction, er/IR, x10 -Thumb tacs x60" -Pulleys: flexion and abduction x60"  10/02/22  -Manual Therapy: myofascial release and trigger point applied to biceps, deltoid, scapular region, and trapezius in order to reduce pain and fascial restrictions to improve ROM -P/ROM: supine, flexion, abduction, horizontal abduction, er/IR, x10 -Ball Rolls: flexion and abduction x10 -Pendulums 2x45 -Doffing and donning sling    PATIENT EDUCATION: Education details: A/ROM and Wall Slides Person educated: Patient Education method: Explanation, Demonstration, and Handouts Education comprehension: verbalized understanding and returned demonstration  HOME EXERCISE PROGRAM: 6/17: Elbow AA/ROM, Wrist and Hand ROM 6/25: Table Slides and Pendulums 6/27: AA/ROM 7/3: A/ROM and Wall slides  GOALS: Goals reviewed with patient? Yes  SHORT TERM GOALS: Target date: 10/26/22  Pt will be provided with and educated on HEP to improve mobility in LUE required for use during ADL completion.   Goal status: IN PROGRESS  2.  Pt will increase LUE P/ROM by 50 degrees to improve ability to use LUE during dressing tasks with minimal compensatory techniques.    Goal status: IN PROGRESS  3.  Pt will increase LUE strength to 3+/5 to improve ability to reach for items at waist to chest height during bathing and grooming tasks.   Goal status: IN PROGRESS   LONG TERM  GOALS: Target date: 11/30/22  Pt will decrease pain in LUE to 3/10 or less to improve ability to sleep for 2+ consecutive hours without waking due to pain.   Goal status: IN PROGRESS  2.  Pt will decrease LUE fascial restrictions to min amounts or less to improve mobility required for functional reaching tasks.    Goal status: IN PROGRESS  3.  Pt will increase LUE A/ROM to [facility] to improve ability to use LUE when reaching overhead or behind back during dressing and bathing tasks.  Goal status: IN PROGRESS  4.   Pt will increase LUE strength to 5/5 or greater to improve ability to use LUE when  lifting or carrying items during meal preparation/housework/yardwork tasks.   Goal status: IN PROGRESS  5.  Pt will return to highest level of function using LUE as dominant during functional task completion.  Goal status: IN PROGRESS   ASSESSMENT:  CLINICAL IMPRESSION: This session pt continuing to progress with his ROM. He was achieving approximately 85% of full AA/ROM and 75% of full A/ROM this session, with cuing to push through mild pain and stopping at sharp pains, which he had no sharp pains this session. Overall his movement pattern looks good, with intermittent cuing for reducing hyper extension of elbow and hiking of his shoulder. Verbal and visual cuing throughout session for positioning and technique.   PERFORMANCE DEFICITS: in functional skills including ADLs, IADLs, ROM, strength, pain, fascial restrictions, Fine motor control, Gross motor control, body mechanics, and UE functional use.   PLAN:  OT FREQUENCY: 2x/week  OT DURATION: 8 weeks  PLANNED INTERVENTIONS: self care/ADL training, therapeutic exercise, therapeutic activity, manual therapy, passive range of motion, functional mobility training, electrical stimulation, ultrasound, moist heat, patient/family education, coping strategies training, and DME and/or AE instructions  RECOMMENDED OTHER SERVICES: N/A  CONSULTED AND  AGREED WITH PLAN OF CARE: Patient  PLAN FOR NEXT SESSION: Manual Therapy, P/ROM, AA/ROM, A/ROM, Wall Slides, Isometrics   Kolette Vey Bing Plume, OTR/L [facility] Out[facility] Re[facility] [phone] Adonus Uselman Rosemarie Beath, OT 10/10/2022, 4:59 PM

## 2022-10-23 ENCOUNTER — Encounter (HOSPITAL_COMMUNITY): Payer: Self-pay | Admitting: Occupational Therapy

## 2022-10-23 ENCOUNTER — Ambulatory Visit (HOSPITAL_COMMUNITY): Payer: 59 | Admitting: Occupational Therapy

## 2022-10-23 DIAGNOSIS — M25612 Stiffness of left shoulder, not elsewhere classified: Secondary | ICD-10-CM

## 2022-10-23 DIAGNOSIS — M25512 Pain in left shoulder: Secondary | ICD-10-CM | POA: Diagnosis not present

## 2022-10-23 DIAGNOSIS — R29898 Other symptoms and signs involving the musculoskeletal system: Secondary | ICD-10-CM

## 2022-10-23 NOTE — Therapy (Signed)
OUTPATIENT OCCUPATIONAL THERAPY ORTHO TREATMENT NOTE  Patient Name: Blake [name] MRN: [id number] DOB:[birth year], 42 y.o., male Today's Date: 10/23/2022  PCP: Babs Sciara, MD REFERRING PROVIDER: Ramond Marrow, MD  END OF SESSION:  OT End of Session - 10/23/22 1901     Visit Number 5    Number of Visits 17    Date for OT Re-Evaluation 11/30/22    Authorization Type [employer]    Progress Note Due on Visit 10    OT Start Time 0949    OT Stop Time 1037    OT Time Calculation (min) 48 min    Activity Tolerance Patient tolerated treatment well    Behavior During Therapy [facility] for tasks assessed/performed             Past Medical History:  Diagnosis Date   Depression    Elevated ferritin 06/25/2019   High blood cholesterol level    Hypertension    Sleep apnea 01/07/2013   cpap   TIA (transient ischemic attack)    Tobacco abuse 11/19/2018   Patient working very hard at quitting   Type 2 diabetes mellitus (HCC) 11/19/2018   Past Surgical History:  Procedure Laterality Date   ABDOMINAL SURGERY     APPENDECTOMY     CHOLECYSTECTOMY     Patient Active Problem List   Diagnosis Date Noted   Depression, major, single episode, moderate (HCC) 05/07/2022   Transient ischemic attack (TIA) 09/25/2021   Essential hypertension 09/25/2021   Umbilical hernia without obstruction and without gangrene 02/21/2021   Elevated ferritin 06/25/2019   Type 2 diabetes mellitus (HCC) 11/19/2018   Tobacco abuse 11/19/2018   Depression, major, single episode, mild (HCC) 11/21/2016   Obesity (BMI 30.0-34.9) 05/19/2016   Obstructive sleep apnea 10/27/2013   Fatty liver 10/27/2013   Hypertriglyceridemia 12/15/2012    ONSET DATE: 09/19/22  REFERRING DIAG: L shoulder arthroscopy and Bicep Tenodesis  THERAPY DIAG:  Stiffness of left shoulder, not elsewhere classified  Acute pain of left shoulder  Other symptoms and signs involving the musculoskeletal system  Rationale for Evaluation  and Treatment: Rehabilitation  SUBJECTIVE:   SUBJECTIVE STATEMENT: "I feel like things are moving along well." Pt accompanied by: self and significant other  PERTINENT HISTORY: PMH significant for HTN, Depression, TIA, DM2  PRECAUTIONS: Shoulder  WEIGHT BEARING RESTRICTIONS: Yes No weight  PAIN:  Are you having pain? Yes: NPRS scale: 4/10 Pain location: Anterior shoulder girdle Pain description: sore Aggravating factors: moving the arm out Relieving factors: Motrin, Celebrex, Tylenol   FALLS: Has patient fallen in last 6 months? No  LIVING ENVIRONMENT: Lives with: lives with their family and lives with their spouse Lives in: House/apartment  PLOF: Independent  PATIENT GOALS: To get back to my old self  NEXT MD VISIT: 10/02/22  OBJECTIVE:   HAND DOMINANCE: Right  ADLs: Overall ADLs: Mod Assist for all BADL's and Total assist for all IADL's. Unable to lift or carry any items at this time.   FUNCTIONAL OUTCOME MEASURES: FOTO: 44.23  UPPER EXTREMITY ROM:     Passive ROM Left eval  Shoulder flexion 119  Shoulder abduction 104  Shoulder internal rotation 90  Shoulder external rotation 13  Elbow flexion 138  Elbow extension -8  (Blank rows = not tested)  UPPER EXTREMITY MMT:     MMT Left eval  Shoulder flexion   Shoulder abduction   Shoulder adduction   Shoulder extension   Shoulder internal rotation   Shoulder external rotation   Elbow  flexion   Elbow extension   (Blank rows = not tested)  SENSATION: WFL  EDEMA: None noted this session  OBSERVATIONS: Moderate to severe fascial restrictions noted in the biceps, anterior shoulder girdle, trapezius, and scapular region.   TODAY'S TREATMENT:                                                                                                                              DATE:   10/23/22 -Manual Therapy: myofascial release and trigger point applied to biceps, deltoid, scapular region, and trapezius in  order to reduce pain and fascial restrictions to improve ROM -AA/ROM: seated, flexion, abduction, protraction, horizontal abduction, er/IR, x15 -A/ROM: seated, flexion, abduction, protraction, horizontal abduction, er/IR, x12 -Isometrics: flexion, extension, abduction, er, IR, 4x15"  10/10/22 -Manual Therapy: myofascial release and trigger point applied to biceps, deltoid, scapular region, and trapezius in order to reduce pain and fascial restrictions to improve ROM -AA/ROM: seated, flexion, abduction, protraction, horizontal abduction, er/IR, x15 -A/ROM: seated, flexion, abduction, protraction, horizontal abduction, er/IR, x10 -Wall Slides: flexion, abduction, x10  10/04/22 -Manual Therapy: myofascial release and trigger point applied to biceps, deltoid, scapular region, and trapezius in order to reduce pain and fascial restrictions to improve ROM -P/ROM: supine, flexion, abduction, horizontal abduction, er/IR, x10 -AA/ROM: supine, flexion, abduction, horizontal abduction, er/IR, x10 -Thumb tacs x60" -Pulleys: flexion and abduction x60"   PATIENT EDUCATION: Education details: Isometrics Person educated: Patient Education method: Programmer, multimedia, Demonstration, and Handouts Education comprehension: verbalized understanding and returned demonstration  HOME EXERCISE PROGRAM: 6/17: Elbow AA/ROM, Wrist and Hand ROM 6/25: Table Slides and Pendulums 6/27: AA/ROM 7/3: A/ROM and Wall slides 7/16: Isometrics  GOALS: Goals reviewed with patient? Yes  SHORT TERM GOALS: Target date: 10/26/22  Pt will be provided with and educated on HEP to improve mobility in LUE required for use during ADL completion.   Goal status: IN PROGRESS  2.  Pt will increase LUE P/ROM by 50 degrees to improve ability to use LUE during dressing tasks with minimal compensatory techniques.    Goal status: IN PROGRESS  3.  Pt will increase LUE strength to 3+/5 to improve ability to reach for items at waist to chest  height during bathing and grooming tasks.   Goal status: IN PROGRESS   LONG TERM GOALS: Target date: 11/30/22  Pt will decrease pain in LUE to 3/10 or less to improve ability to sleep for 2+ consecutive hours without waking due to pain.   Goal status: IN PROGRESS  2.  Pt will decrease LUE fascial restrictions to min amounts or less to improve mobility required for functional reaching tasks.    Goal status: IN PROGRESS  3.  Pt will increase LUE A/ROM to [facility] to improve ability to use LUE when reaching overhead or behind back during dressing and bathing tasks.  Goal status: IN PROGRESS  4.   Pt will increase LUE strength to 5/5 or greater to improve ability to use LUE  when lifting or carrying items during meal preparation/housework/yardwork tasks.   Goal status: IN PROGRESS  5.  Pt will return to highest level of function using LUE as dominant during functional task completion.  Goal status: IN PROGRESS   ASSESSMENT:  CLINICAL IMPRESSION: Pt demonstrating improving ROM, where he is achieving 85% of full A/ROM. Pt reporting minimal pain/discomfort with moderate fatigue, requiring 3 short rest breaks throughout the session. OT adding isometrics this session to start low level strengthening and stabilizing of the shoulder girdle. Verbal and visual cuing provided throughout for positioning and technique with all exercises.   PERFORMANCE DEFICITS: in functional skills including ADLs, IADLs, ROM, strength, pain, fascial restrictions, Fine motor control, Gross motor control, body mechanics, and UE functional use.   PLAN:  OT FREQUENCY: 2x/week  OT DURATION: 8 weeks  PLANNED INTERVENTIONS: self care/ADL training, therapeutic exercise, therapeutic activity, manual therapy, passive range of motion, functional mobility training, electrical stimulation, ultrasound, moist heat, patient/family education, coping strategies training, and DME and/or AE instructions  RECOMMENDED OTHER SERVICES:  N/A  CONSULTED AND AGREED WITH PLAN OF CARE: Patient  PLAN FOR NEXT SESSION: Manual Therapy, AA/ROM, A/ROM, Wall Slides, Isometrics   Blake [name], OTR/L [facility] [phone] Blake [name], OT 10/23/2022, 7:02 PM

## 2022-10-23 NOTE — Patient Instructions (Signed)

## 2022-10-25 ENCOUNTER — Encounter (HOSPITAL_COMMUNITY): Payer: Self-pay | Admitting: Occupational Therapy

## 2022-10-25 ENCOUNTER — Ambulatory Visit (HOSPITAL_COMMUNITY): Payer: 59 | Admitting: Occupational Therapy

## 2022-10-25 DIAGNOSIS — M25612 Stiffness of left shoulder, not elsewhere classified: Secondary | ICD-10-CM

## 2022-10-25 DIAGNOSIS — M25512 Pain in left shoulder: Secondary | ICD-10-CM

## 2022-10-25 DIAGNOSIS — R29898 Other symptoms and signs involving the musculoskeletal system: Secondary | ICD-10-CM

## 2022-10-25 NOTE — Therapy (Signed)
OUTPATIENT OCCUPATIONAL THERAPY ORTHO TREATMENT NOTE  Patient Name: Blake [name] MRN: [id number] DOB:1980-08-20, 42 y.o., male Today's Date: 10/26/2022  PCP: Babs Sciara, MD REFERRING PROVIDER: Ramond Marrow, MD  END OF SESSION:  OT End of Session - 10/25/22 1345     Visit Number 6    Number of Visits 17    Date for OT Re-Evaluation 11/30/22    Authorization Type [employer]    Progress Note Due on Visit 10    OT Start Time 1306    OT Stop Time 1347    OT Time Calculation (min) 41 min    Activity Tolerance Patient tolerated treatment well    Behavior During Therapy [facility] for tasks assessed/performed             Past Medical History:  Diagnosis Date   Depression    Elevated ferritin 06/25/2019   High blood cholesterol level    Hypertension    Sleep apnea 01/07/2013   cpap   TIA (transient ischemic attack)    Tobacco abuse 11/19/2018   Patient working very hard at quitting   Type 2 diabetes mellitus (HCC) 11/19/2018   Past Surgical History:  Procedure Laterality Date   ABDOMINAL SURGERY     APPENDECTOMY     CHOLECYSTECTOMY     Patient Active Problem List   Diagnosis Date Noted   Depression, major, single episode, moderate (HCC) 05/07/2022   Transient ischemic attack (TIA) 09/25/2021   Essential hypertension 09/25/2021   Umbilical hernia without obstruction and without gangrene 02/21/2021   Elevated ferritin 06/25/2019   Type 2 diabetes mellitus (HCC) 11/19/2018   Tobacco abuse 11/19/2018   Depression, major, single episode, mild (HCC) 11/21/2016   Obesity (BMI 30.0-34.9) 05/19/2016   Obstructive sleep apnea 10/27/2013   Fatty liver 10/27/2013   Hypertriglyceridemia 12/15/2012    ONSET DATE: 09/19/22  REFERRING DIAG: L shoulder arthroscopy and Bicep Tenodesis  THERAPY DIAG:  Stiffness of left shoulder, not elsewhere classified  Acute pain of left shoulder  Other symptoms and signs involving the musculoskeletal system  Rationale for Evaluation  and Treatment: Rehabilitation  SUBJECTIVE:   SUBJECTIVE STATEMENT: "It's been feeling pretty good" Pt accompanied by: self and significant other  PERTINENT HISTORY: PMH significant for HTN, Depression, TIA, DM2  PRECAUTIONS: Shoulder  WEIGHT BEARING RESTRICTIONS: Yes No weight  PAIN:  Are you having pain? Yes: NPRS scale: 2/10 Pain location: Anterior shoulder girdle Pain description: sore Aggravating factors: moving the arm out Relieving factors: Motrin, Celebrex, Tylenol   FALLS: Has patient fallen in last 6 months? No  LIVING ENVIRONMENT: Lives with: lives with their family and lives with their spouse Lives in: House/apartment  PLOF: Independent  PATIENT GOALS: To get back to my old self  NEXT MD VISIT: 10/02/22  OBJECTIVE:   HAND DOMINANCE: Right  ADLs: Overall ADLs: Mod Assist for all BADL's and Total assist for all IADL's. Unable to lift or carry any items at this time.   FUNCTIONAL OUTCOME MEASURES: FOTO: 44.23  UPPER EXTREMITY ROM:     Passive ROM Left eval  Shoulder flexion 119  Shoulder abduction 104  Shoulder internal rotation 90  Shoulder external rotation 13  Elbow flexion 138  Elbow extension -8  (Blank rows = not tested)  UPPER EXTREMITY MMT:     MMT Left eval  Shoulder flexion   Shoulder abduction   Shoulder adduction   Shoulder extension   Shoulder internal rotation   Shoulder external rotation   Elbow flexion  Elbow extension   (Blank rows = not tested)  SENSATION: WFL  EDEMA: None noted this session  OBSERVATIONS: Moderate to severe fascial restrictions noted in the biceps, anterior shoulder girdle, trapezius, and scapular region.   TODAY'S TREATMENT:                                                                                                                              DATE:   10/25/22 -Manual Therapy: myofascial release and trigger point applied to biceps, deltoid, scapular region, and trapezius in order to reduce  pain and fascial restrictions to improve ROM -A/ROM: seated, flexion, abduction, protraction, horizontal abduction, er/IR, x12 -Proximal shoulder exercises: paddles, criss cross, circles both directions, 10 reps -Shoulder strengthening: 2lb dumbbells, flexion, abduction, protraction, horizontal abduction, er/IR, x10 -ABC's on the wall, red weighted ball -Overhead lacing  10/23/22 -Manual Therapy: myofascial release and trigger point applied to biceps, deltoid, scapular region, and trapezius in order to reduce pain and fascial restrictions to improve ROM -AA/ROM: seated, flexion, abduction, protraction, horizontal abduction, er/IR, x15 -A/ROM: seated, flexion, abduction, protraction, horizontal abduction, er/IR, x12 -Isometrics: flexion, extension, abduction, er, IR, 4x15"  10/10/22 -Manual Therapy: myofascial release and trigger point applied to biceps, deltoid, scapular region, and trapezius in order to reduce pain and fascial restrictions to improve ROM -AA/ROM: seated, flexion, abduction, protraction, horizontal abduction, er/IR, x15 -A/ROM: seated, flexion, abduction, protraction, horizontal abduction, er/IR, x10 -Wall Slides: flexion, abduction, x10   PATIENT EDUCATION: Education details: Shoulder strengthening with weights Person educated: Patient Education method: Explanation, Demonstration, and Handouts Education comprehension: verbalized understanding and returned demonstration  HOME EXERCISE PROGRAM: 6/17: Elbow AA/ROM, Wrist and Hand ROM 6/25: Table Slides and Pendulums 6/27: AA/ROM 7/3: A/ROM and Wall slides 7/16: Isometrics 7/18: Shoulder Strengthening with weights  GOALS: Goals reviewed with patient? Yes  SHORT TERM GOALS: Target date: 10/26/22  Pt will be provided with and educated on HEP to improve mobility in LUE required for use during ADL completion.   Goal status: IN PROGRESS  2.  Pt will increase LUE P/ROM by 50 degrees to improve ability to use LUE during  dressing tasks with minimal compensatory techniques.    Goal status: IN PROGRESS  3.  Pt will increase LUE strength to 3+/5 to improve ability to reach for items at waist to chest height during bathing and grooming tasks.   Goal status: IN PROGRESS   LONG TERM GOALS: Target date: 11/30/22  Pt will decrease pain in LUE to 3/10 or less to improve ability to sleep for 2+ consecutive hours without waking due to pain.   Goal status: IN PROGRESS  2.  Pt will decrease LUE fascial restrictions to min amounts or less to improve mobility required for functional reaching tasks.    Goal status: IN PROGRESS  3.  Pt will increase LUE A/ROM to [facility] to improve ability to use LUE when reaching overhead or behind back during dressing and bathing tasks.  Goal status:  IN PROGRESS  4.   Pt will increase LUE strength to 5/5 or greater to improve ability to use LUE when lifting or carrying items during meal preparation/housework/yardwork tasks.   Goal status: IN PROGRESS  5.  Pt will return to highest level of function using LUE as dominant during functional task completion.  Goal status: IN PROGRESS   ASSESSMENT:  CLINICAL IMPRESSION: This session pt was able to initiate light weights, per Dr. Everardo Pacific. He demonstrated mild difficulty with abduction and horizontal abduction using the 2lb weights, however with rest breaks, he was able to complete each set of exercises. Pt was also able to initiate multiple endurance/stabilizing activities this session with mild fatigue noted towards the end of the session. OT providing verbal and visual cuing throughout for positioning and technique.   PERFORMANCE DEFICITS: in functional skills including ADLs, IADLs, ROM, strength, pain, fascial restrictions, Fine motor control, Gross motor control, body mechanics, and UE functional use.   PLAN:  OT FREQUENCY: 2x/week  OT DURATION: 8 weeks  PLANNED INTERVENTIONS: self care/ADL training, therapeutic exercise,  therapeutic activity, manual therapy, passive range of motion, functional mobility training, electrical stimulation, ultrasound, moist heat, patient/family education, coping strategies training, and DME and/or AE instructions  RECOMMENDED OTHER SERVICES: N/A  CONSULTED AND AGREED WITH PLAN OF CARE: Patient  PLAN FOR NEXT SESSION: Manual Therapy, AA/ROM, A/ROM, Wall Slides, Isometrics   Kimiyah Blick Bing Plume, OTR/L [facility] [phone] Hema Lanza Rosemarie Beath, OT 10/26/2022, 10:22 AM

## 2022-11-02 ENCOUNTER — Encounter (HOSPITAL_COMMUNITY): Payer: Self-pay | Admitting: Occupational Therapy

## 2022-11-02 ENCOUNTER — Ambulatory Visit (HOSPITAL_COMMUNITY): Payer: 59 | Admitting: Occupational Therapy

## 2022-11-02 DIAGNOSIS — M25512 Pain in left shoulder: Secondary | ICD-10-CM | POA: Diagnosis not present

## 2022-11-02 DIAGNOSIS — R29898 Other symptoms and signs involving the musculoskeletal system: Secondary | ICD-10-CM

## 2022-11-02 DIAGNOSIS — M25612 Stiffness of left shoulder, not elsewhere classified: Secondary | ICD-10-CM

## 2022-11-02 NOTE — Therapy (Signed)
OUTPATIENT OCCUPATIONAL THERAPY ORTHO TREATMENT NOTE  Patient Name: Blake [name] MRN: [id number] DOB:May 21, 1980, 42 y.o., male Today's Date: 11/02/2022  PCP: Babs Sciara, MD REFERRING PROVIDER: Ramond Marrow, MD  END OF SESSION:  OT End of Session - 11/02/22 0741     Visit Number 7    Number of Visits 17    Date for OT Re-Evaluation 11/30/22    Authorization Type [employer]    Progress Note Due on Visit 10    OT Start Time 0739    OT Stop Time 0819    OT Time Calculation (min) 40 min    Activity Tolerance Patient tolerated treatment well    Behavior During Therapy [facility] for tasks assessed/performed             Past Medical History:  Diagnosis Date   Depression    Elevated ferritin 06/25/2019   High blood cholesterol level    Hypertension    Sleep apnea 01/07/2013   cpap   TIA (transient ischemic attack)    Tobacco abuse 11/19/2018   Patient working very hard at quitting   Type 2 diabetes mellitus (HCC) 11/19/2018   Past Surgical History:  Procedure Laterality Date   ABDOMINAL SURGERY     APPENDECTOMY     CHOLECYSTECTOMY     Patient Active Problem List   Diagnosis Date Noted   Depression, major, single episode, moderate (HCC) 05/07/2022   Transient ischemic attack (TIA) 09/25/2021   Essential hypertension 09/25/2021   Umbilical hernia without obstruction and without gangrene 02/21/2021   Elevated ferritin 06/25/2019   Type 2 diabetes mellitus (HCC) 11/19/2018   Tobacco abuse 11/19/2018   Depression, major, single episode, mild (HCC) 11/21/2016   Obesity (BMI 30.0-34.9) 05/19/2016   Obstructive sleep apnea 10/27/2013   Fatty liver 10/27/2013   Hypertriglyceridemia 12/15/2012    ONSET DATE: 09/19/22  REFERRING DIAG: L shoulder arthroscopy and Bicep Tenodesis  THERAPY DIAG:  Stiffness of left shoulder, not elsewhere classified  Acute pain of left shoulder  Other symptoms and signs involving the musculoskeletal system  Rationale for Evaluation  and Treatment: Rehabilitation  SUBJECTIVE:   SUBJECTIVE STATEMENT: "It's been feeling pretty good" Pt accompanied by: self and significant other  PERTINENT HISTORY: PMH significant for HTN, Depression, TIA, DM2  PRECAUTIONS: Shoulder  WEIGHT BEARING RESTRICTIONS: Yes No weight  PAIN:  Are you having pain? No  FALLS: Has patient fallen in last 6 months? No  LIVING ENVIRONMENT: Lives with: lives with their family and lives with their spouse Lives in: House/apartment  PLOF: Independent  PATIENT GOALS: To get back to my old self  NEXT MD VISIT: 10/02/22  OBJECTIVE:   HAND DOMINANCE: Right  ADLs: Overall ADLs: Mod Assist for all BADL's and Total assist for all IADL's. Unable to lift or carry any items at this time.   FUNCTIONAL OUTCOME MEASURES: FOTO: 44.23  UPPER EXTREMITY ROM:     Passive ROM Left eval  Shoulder flexion 119  Shoulder abduction 104  Shoulder internal rotation 90  Shoulder external rotation 13  Elbow flexion 138  Elbow extension -8  (Blank rows = not tested)  UPPER EXTREMITY MMT:     MMT Left eval  Shoulder flexion   Shoulder abduction   Shoulder adduction   Shoulder extension   Shoulder internal rotation   Shoulder external rotation   Elbow flexion   Elbow extension   (Blank rows = not tested)  SENSATION: WFL  EDEMA: None noted this session  OBSERVATIONS: Moderate to severe  fascial restrictions noted in the biceps, anterior shoulder girdle, trapezius, and scapular region.   TODAY'S TREATMENT:                                                                                                                              DATE:   11/02/22 -Manual Therapy: myofascial release and trigger point applied to biceps, deltoid, scapular region, and trapezius in order to reduce pain and fascial restrictions to improve ROM -A/ROM: seated, flexion, abduction, protraction, horizontal abduction, er/IR, x15 -Functional Reaching: 4 -2lb dumbbells, 6  -1lb dumbbells, counter height to shoulder height shelf, to head height shelf, to overhead shelf -X to V arms, x10 -Goal Post Arms, x10 -ABC's on the wall, red weighted ball (rest on O) -Shoulder strengthening: 2lb dumbbells, flexion, abduction, protraction, horizontal abduction, er/IR, x10 -UBE: level 3, KPH 3.0+, 2 min forwards and backwards  10/25/22 -Manual Therapy: myofascial release and trigger point applied to biceps, deltoid, scapular region, and trapezius in order to reduce pain and fascial restrictions to improve ROM -A/ROM: seated, flexion, abduction, protraction, horizontal abduction, er/IR, x12 -Proximal shoulder exercises: paddles, criss cross, circles both directions, 10 reps -Shoulder strengthening: 2lb dumbbells, flexion, abduction, protraction, horizontal abduction, er/IR, x10 -ABC's on the wall, red weighted ball -Overhead lacing  10/23/22 -Manual Therapy: myofascial release and trigger point applied to biceps, deltoid, scapular region, and trapezius in order to reduce pain and fascial restrictions to improve ROM -AA/ROM: seated, flexion, abduction, protraction, horizontal abduction, er/IR, x15 -A/ROM: seated, flexion, abduction, protraction, horizontal abduction, er/IR, x12 -Isometrics: flexion, extension, abduction, er, IR, 4x15"   PATIENT EDUCATION: Education details: functional reaching Person educated: Patient Education method: Explanation, Demonstration, and Handouts Education comprehension: verbalized understanding and returned demonstration  HOME EXERCISE PROGRAM: 6/17: Elbow AA/ROM, Wrist and Hand ROM 6/25: Table Slides and Pendulums 6/27: AA/ROM 7/3: A/ROM and Wall slides 7/16: Isometrics 7/18: Shoulder Strengthening with weights 7/26: Functional Reaching  GOALS: Goals reviewed with patient? Yes  SHORT TERM GOALS: Target date: 10/26/22  Pt will be provided with and educated on HEP to improve mobility in LUE required for use during ADL completion.    Goal status: IN PROGRESS  2.  Pt will increase LUE P/ROM by 50 degrees to improve ability to use LUE during dressing tasks with minimal compensatory techniques.    Goal status: IN PROGRESS  3.  Pt will increase LUE strength to 3+/5 to improve ability to reach for items at waist to chest height during bathing and grooming tasks.   Goal status: IN PROGRESS   LONG TERM GOALS: Target date: 11/30/22  Pt will decrease pain in LUE to 3/10 or less to improve ability to sleep for 2+ consecutive hours without waking due to pain.   Goal status: IN PROGRESS  2.  Pt will decrease LUE fascial restrictions to min amounts or less to improve mobility required for functional reaching tasks.    Goal status: IN PROGRESS  3.  Pt will  increase LUE A/ROM to [facility] to improve ability to use LUE when reaching overhead or behind back during dressing and bathing tasks.  Goal status: IN PROGRESS  4.   Pt will increase LUE strength to 5/5 or greater to improve ability to use LUE when lifting or carrying items during meal preparation/housework/yardwork tasks.   Goal status: IN PROGRESS  5.  Pt will return to highest level of function using LUE as dominant during functional task completion.  Goal status: IN PROGRESS   ASSESSMENT:  CLINICAL IMPRESSION: Pt continuing to demonstrate good ROM and improving strength. This session he initiated functional reaching, where he fatigued relatively quickly, once he was at head height shelves. With the ball on the wall task, initially he felt some pain in his shoulder joint, however that quickly released and went away. OT Providing verbal and visual cuing for positioning and technique throughout session.   PERFORMANCE DEFICITS: in functional skills including ADLs, IADLs, ROM, strength, pain, fascial restrictions, Fine motor control, Gross motor control, body mechanics, and UE functional use.   PLAN:  OT FREQUENCY: 2x/week  OT DURATION: 8 weeks  PLANNED  INTERVENTIONS: self care/ADL training, therapeutic exercise, therapeutic activity, manual therapy, passive range of motion, functional mobility training, electrical stimulation, ultrasound, moist heat, patient/family education, coping strategies training, and DME and/or AE instructions  RECOMMENDED OTHER SERVICES: N/A  CONSULTED AND AGREED WITH PLAN OF CARE: Patient  PLAN FOR NEXT SESSION: Manual Therapy, AA/ROM, A/ROM, Wall Slides, Isometrics   Darlyne Schmiesing Bing Plume, OTR/L [facility] [phone] Kyana Aicher Rosemarie Beath, OT 11/02/2022, 7:42 AM

## 2022-11-06 ENCOUNTER — Ambulatory Visit (HOSPITAL_COMMUNITY): Payer: 59 | Admitting: Occupational Therapy

## 2022-11-06 ENCOUNTER — Encounter (HOSPITAL_COMMUNITY): Payer: Self-pay | Admitting: Occupational Therapy

## 2022-11-06 DIAGNOSIS — R29898 Other symptoms and signs involving the musculoskeletal system: Secondary | ICD-10-CM

## 2022-11-06 DIAGNOSIS — M25512 Pain in left shoulder: Secondary | ICD-10-CM | POA: Diagnosis not present

## 2022-11-06 DIAGNOSIS — M25612 Stiffness of left shoulder, not elsewhere classified: Secondary | ICD-10-CM

## 2022-11-06 NOTE — Therapy (Signed)
OUTPATIENT OCCUPATIONAL THERAPY ORTHO TREATMENT NOTE  Patient Name: Blake [name] MRN: [id number] DOB:05-Mar-1981, 42 y.o., male Today's Date: 11/06/2022  PCP: Babs Sciara, MD REFERRING PROVIDER: Ramond Marrow, MD  END OF SESSION:  OT End of Session - 11/06/22 1345     Visit Number 8    Number of Visits 17    Date for OT Re-Evaluation 11/30/22    Authorization Type [employer]    Progress Note Due on Visit 10    OT Start Time 1302    OT Stop Time 1345    OT Time Calculation (min) 43 min    Activity Tolerance Patient tolerated treatment well    Behavior During Therapy [facility] for tasks assessed/performed              Past Medical History:  Diagnosis Date   Depression    Elevated ferritin 06/25/2019   High blood cholesterol level    Hypertension    Sleep apnea 01/07/2013   cpap   TIA (transient ischemic attack)    Tobacco abuse 11/19/2018   Patient working very hard at quitting   Type 2 diabetes mellitus (HCC) 11/19/2018   Past Surgical History:  Procedure Laterality Date   ABDOMINAL SURGERY     APPENDECTOMY     CHOLECYSTECTOMY     Patient Active Problem List   Diagnosis Date Noted   Depression, major, single episode, moderate (HCC) 05/07/2022   Transient ischemic attack (TIA) 09/25/2021   Essential hypertension 09/25/2021   Umbilical hernia without obstruction and without gangrene 02/21/2021   Elevated ferritin 06/25/2019   Type 2 diabetes mellitus (HCC) 11/19/2018   Tobacco abuse 11/19/2018   Depression, major, single episode, mild (HCC) 11/21/2016   Obesity (BMI 30.0-34.9) 05/19/2016   Obstructive sleep apnea 10/27/2013   Fatty liver 10/27/2013   Hypertriglyceridemia 12/15/2012    ONSET DATE: 09/19/22  REFERRING DIAG: L shoulder arthroscopy and Bicep Tenodesis  THERAPY DIAG:  Stiffness of left shoulder, not elsewhere classified  Acute pain of left shoulder  Other symptoms and signs involving the musculoskeletal system  Rationale for Evaluation  and Treatment: Rehabilitation  SUBJECTIVE:   SUBJECTIVE STATEMENT: S: "I heard some popping last night."   PERTINENT HISTORY: PMH significant for HTN, Depression, TIA, DM2  PRECAUTIONS: Shoulder  WEIGHT BEARING RESTRICTIONS: Yes No weight  PAIN:  Are you having pain? Yes: NPRS scale: 3/10 Pain location: left anterior shoulder Pain description: sore Aggravating factors: certain movements Relieving factors: rest  FALLS: Has patient fallen in last 6 months? No  LIVING ENVIRONMENT: Lives with: lives with their family and lives with their spouse Lives in: House/apartment  PLOF: Independent  PATIENT GOALS: To get back to my old self  NEXT MD VISIT: unsure  OBJECTIVE:   HAND DOMINANCE: Right  ADLs: Overall ADLs: Mod Assist for all BADL's and Total assist for all IADL's. Unable to lift or carry any items at this time.   FUNCTIONAL OUTCOME MEASURES: FOTO: 44.23  UPPER EXTREMITY ROM:     Passive ROM Left eval  Shoulder flexion 119  Shoulder abduction 104  Shoulder internal rotation 90  Shoulder external rotation 13  Elbow flexion 138  Elbow extension -8  (Blank rows = not tested)  UPPER EXTREMITY MMT:     MMT Left eval  Shoulder flexion   Shoulder abduction   Shoulder adduction   Shoulder extension   Shoulder internal rotation   Shoulder external rotation   Elbow flexion   Elbow extension   (Blank rows = not  tested)  SENSATION: WFL  EDEMA: None noted this session  OBSERVATIONS: Moderate to severe fascial restrictions noted in the biceps, anterior shoulder girdle, trapezius, and scapular region.   TODAY'S TREATMENT:                                                                                                                              DATE:  11/06/22 -Manual Therapy: myofascial release and trigger point applied to biceps, deltoid, scapular region, and trapezius in order to reduce pain and fascial restrictions to improve ROM -P/ROM:  supine-flexion, abduction, er, 5 reps -A/ROM: supine- flexion, abduction, protraction, horizontal abduction, er/IR, x10 -Proximal shoulder strengthening: supine-paddles, criss cross, circles each direction, 10 reps each -A/ROM: standing- flexion, abduction, protraction, horizontal abduction, er/IR, x10 -Proximal shoulder strengthening: standing-paddles, criss cross, circles each direction, 10 reps each -X to V arms: 10 reps -shoulder stretches: IR behind back with horizontal towel, doorway stretch, 2x15", cross chest stretch, 1x15" -ABC writing: 90 degrees flexion, no weight -Proximal shoulder strengthening: on doorway using washcloth, 90 degrees flexion and abduction  11/02/22 -Manual Therapy: myofascial release and trigger point applied to biceps, deltoid, scapular region, and trapezius in order to reduce pain and fascial restrictions to improve ROM -A/ROM: seated, flexion, abduction, protraction, horizontal abduction, er/IR, x15 -Functional Reaching: 4 -2lb dumbbells, 6 -1lb dumbbells, counter height to shoulder height shelf, to head height shelf, to overhead shelf -X to V arms, x10 -Goal Post Arms, x10 -ABC's on the wall, red weighted ball (rest on O) -Shoulder strengthening: 2lb dumbbells, flexion, abduction, protraction, horizontal abduction, er/IR, x10 -UBE: level 3, KPH 3.0+, 2 min forwards and backwards  10/25/22 -Manual Therapy: myofascial release and trigger point applied to biceps, deltoid, scapular region, and trapezius in order to reduce pain and fascial restrictions to improve ROM -A/ROM: seated, flexion, abduction, protraction, horizontal abduction, er/IR, x12 -Proximal shoulder exercises: paddles, criss cross, circles both directions, 10 reps -Shoulder strengthening: 2lb dumbbells, flexion, abduction, protraction, horizontal abduction, er/IR, x10 -ABC's on the wall, red weighted ball -Overhead lacing   PATIENT EDUCATION: Education details: downgrade from weights to  A/ROM Person educated: Patient Education method: Programmer, multimedia, Facilities manager, and Handouts Education comprehension: verbalized understanding and returned demonstration  HOME EXERCISE PROGRAM: 6/17: Elbow AA/ROM, Wrist and Hand ROM 6/25: Table Slides and Pendulums 6/27: AA/ROM 7/3: A/ROM and Wall slides 7/16: Isometrics 7/18: Shoulder Strengthening with weights 7/26: Functional Reaching  GOALS: Goals reviewed with patient? Yes  SHORT TERM GOALS: Target date: 10/26/22  Pt will be provided with and educated on HEP to improve mobility in LUE required for use during ADL completion.   Goal status: IN PROGRESS  2.  Pt will increase LUE P/ROM by 50 degrees to improve ability to use LUE during dressing tasks with minimal compensatory techniques.    Goal status: IN PROGRESS  3.  Pt will increase LUE strength to 3+/5 to improve ability to reach for items at waist to chest height during bathing and grooming  tasks.   Goal status: IN PROGRESS   LONG TERM GOALS: Target date: 11/30/22  Pt will decrease pain in LUE to 3/10 or less to improve ability to sleep for 2+ consecutive hours without waking due to pain.   Goal status: IN PROGRESS  2.  Pt will decrease LUE fascial restrictions to min amounts or less to improve mobility required for functional reaching tasks.    Goal status: IN PROGRESS  3.  Pt will increase LUE A/ROM to [facility] to improve ability to use LUE when reaching overhead or behind back during dressing and bathing tasks.  Goal status: IN PROGRESS  4.   Pt will increase LUE strength to 5/5 or greater to improve ability to use LUE when lifting or carrying items during meal preparation/housework/yardwork tasks.   Goal status: IN PROGRESS  5.  Pt will return to highest level of function using LUE as dominant during functional task completion.  Goal status: IN PROGRESS   ASSESSMENT:  CLINICAL IMPRESSION: Pt reports a new popping in his left shoulder with increased soreness  and discomfort. Discussed current HEP and reviewed recent sessions. Downgraded exercises to A/ROM and stability work today, no weights for strengthening. Pt with mod fatigue, however completing with minimal popping/discomfort. Pt with improved pain during abduction and anterior shoulder stretching, increased with flexion or cross chest stretch. Pt agreeable to downgrade HEP to A/ROM for now. Verbal cuing for form and technique during exercises.   PERFORMANCE DEFICITS: in functional skills including ADLs, IADLs, ROM, strength, pain, fascial restrictions, Fine motor control, Gross motor control, body mechanics, and UE functional use.   PLAN:  OT FREQUENCY: 2x/week  OT DURATION: 8 weeks  PLANNED INTERVENTIONS: self care/ADL training, therapeutic exercise, therapeutic activity, manual therapy, passive range of motion, functional mobility training, electrical stimulation, ultrasound, moist heat, patient/family education, coping strategies training, and DME and/or AE instructions  CONSULTED AND AGREED WITH PLAN OF CARE: Patient  PLAN FOR NEXT SESSION: Manual Therapy, AA/ROM, A/ROM, Wall Slides, Isometrics   Ezra Sites, OTR/L  [phone] 11/06/2022, 1:45 PM

## 2022-11-08 ENCOUNTER — Ambulatory Visit (HOSPITAL_COMMUNITY): Payer: 59 | Attending: Orthopaedic Surgery | Admitting: Occupational Therapy

## 2022-11-08 ENCOUNTER — Encounter (HOSPITAL_COMMUNITY): Payer: Self-pay | Admitting: Occupational Therapy

## 2022-11-08 DIAGNOSIS — R29898 Other symptoms and signs involving the musculoskeletal system: Secondary | ICD-10-CM | POA: Insufficient documentation

## 2022-11-08 DIAGNOSIS — M25612 Stiffness of left shoulder, not elsewhere classified: Secondary | ICD-10-CM | POA: Insufficient documentation

## 2022-11-08 DIAGNOSIS — M25512 Pain in left shoulder: Secondary | ICD-10-CM | POA: Diagnosis present

## 2022-11-08 NOTE — Therapy (Signed)
OUTPATIENT OCCUPATIONAL THERAPY ORTHO TREATMENT NOTE  Patient Name: Blake [name] MRN: [id number] DOB:[birth year], 42 y.o., male Today's Date: 11/09/2022  PCP: Babs Sciara, MD REFERRING PROVIDER: Ramond Marrow, MD  END OF SESSION:  OT End of Session - 11/08/22 1115     Visit Number 9    Number of Visits 17    Date for OT Re-Evaluation 11/30/22    Authorization Type [employer]    Progress Note Due on Visit 10    OT Start Time 1035    OT Stop Time 1116    OT Time Calculation (min) 41 min    Activity Tolerance Patient tolerated treatment well    Behavior During Therapy [facility] for tasks assessed/performed               Past Medical History:  Diagnosis Date   Depression    Elevated ferritin 06/25/2019   High blood cholesterol level    Hypertension    Sleep apnea 01/07/2013   cpap   TIA (transient ischemic attack)    Tobacco abuse 11/19/2018   Patient working very hard at quitting   Type 2 diabetes mellitus (HCC) 11/19/2018   Past Surgical History:  Procedure Laterality Date   ABDOMINAL SURGERY     APPENDECTOMY     CHOLECYSTECTOMY     Patient Active Problem List   Diagnosis Date Noted   Depression, major, single episode, moderate (HCC) 05/07/2022   Transient ischemic attack (TIA) 09/25/2021   Essential hypertension 09/25/2021   Umbilical hernia without obstruction and without gangrene 02/21/2021   Elevated ferritin 06/25/2019   Type 2 diabetes mellitus (HCC) 11/19/2018   Tobacco abuse 11/19/2018   Depression, major, single episode, mild (HCC) 11/21/2016   Obesity (BMI 30.0-34.9) 05/19/2016   Obstructive sleep apnea 10/27/2013   Fatty liver 10/27/2013   Hypertriglyceridemia 12/15/2012    ONSET DATE: 09/19/22  REFERRING DIAG: L shoulder arthroscopy and Bicep Tenodesis  THERAPY DIAG:  Stiffness of left shoulder, not elsewhere classified  Other symptoms and signs involving the musculoskeletal system  Acute pain of left shoulder  Rationale for Evaluation  and Treatment: Rehabilitation  SUBJECTIVE:   SUBJECTIVE STATEMENT: S: "My shoulders keep having little pops here and there."   PERTINENT HISTORY: PMH significant for HTN, Depression, TIA, DM2  PRECAUTIONS: Shoulder  WEIGHT BEARING RESTRICTIONS: Yes No weight  PAIN:  Are you having pain? Yes: NPRS scale: 3/10 Pain location: left anterior shoulder Pain description: sore Aggravating factors: certain movements Relieving factors: rest  FALLS: Has patient fallen in last 6 months? No  LIVING ENVIRONMENT: Lives with: lives with their family and lives with their spouse Lives in: House/apartment  PLOF: Independent  PATIENT GOALS: To get back to my old self  NEXT MD VISIT: unsure  OBJECTIVE:   HAND DOMINANCE: Right  ADLs: Overall ADLs: Mod Assist for all BADL's and Total assist for all IADL's. Unable to lift or carry any items at this time.   FUNCTIONAL OUTCOME MEASURES: FOTO: 44.23  UPPER EXTREMITY ROM:     Passive ROM Left eval  Shoulder flexion 119  Shoulder abduction 104  Shoulder internal rotation 90  Shoulder external rotation 13  Elbow flexion 138  Elbow extension -8  (Blank rows = not tested)  UPPER EXTREMITY MMT:     MMT Left eval  Shoulder flexion   Shoulder abduction   Shoulder adduction   Shoulder extension   Shoulder internal rotation   Shoulder external rotation   Elbow flexion   Elbow extension   (  Blank rows = not tested)  SENSATION: WFL  EDEMA: None noted this session  OBSERVATIONS: Moderate to severe fascial restrictions noted in the biceps, anterior shoulder girdle, trapezius, and scapular region.   TODAY'S TREATMENT:                                                                                                                              DATE:   11/08/22 -Manual Therapy: myofascial release and trigger point applied to biceps, deltoid, scapular region, and trapezius in order to reduce pain and fascial restrictions to improve  ROM -A/ROM: seated- flexion, abduction, protraction, horizontal abduction, er/IR, x10 -Proximal shoulder strengthening: seated, 1lb dumbbell-paddles, criss cross, circles each direction, 12 reps each -X to V arms, x10 -Goal Post Arms, x10 -Latissimus stretch 4x15" -ABC's on the wall with red weighted ball -Stretching: cross the body stretch, flexion on the wall, er stretch, 2x15"  11/06/22 -Manual Therapy: myofascial release and trigger point applied to biceps, deltoid, scapular region, and trapezius in order to reduce pain and fascial restrictions to improve ROM -P/ROM: supine-flexion, abduction, er, 5 reps -A/ROM: supine- flexion, abduction, protraction, horizontal abduction, er/IR, x10 -Proximal shoulder strengthening: supine-paddles, criss cross, circles each direction, 10 reps each -A/ROM: standing- flexion, abduction, protraction, horizontal abduction, er/IR, x10 -Proximal shoulder strengthening: standing-paddles, criss cross, circles each direction, 10 reps each -X to V arms: 10 reps -shoulder stretches: IR behind back with horizontal towel, doorway stretch, 2x15", cross chest stretch, 1x15" -ABC writing: 90 degrees flexion, no weight -Proximal shoulder strengthening: on doorway using washcloth, 90 degrees flexion and abduction  11/02/22 -Manual Therapy: myofascial release and trigger point applied to biceps, deltoid, scapular region, and trapezius in order to reduce pain and fascial restrictions to improve ROM -A/ROM: seated, flexion, abduction, protraction, horizontal abduction, er/IR, x15 -Functional Reaching: 4 -2lb dumbbells, 6 -1lb dumbbells, counter height to shoulder height shelf, to head height shelf, to overhead shelf -X to V arms, x10 -Goal Post Arms, x10 -ABC's on the wall, red weighted ball (rest on O) -Shoulder strengthening: 2lb dumbbells, flexion, abduction, protraction, horizontal abduction, er/IR, x10 -UBE: level 3, KPH 3.0+, 2 min forwards and  backwards   PATIENT EDUCATION: Education details: Teacher, adult education, flexion, and er stretches Person educated: Patient Education method: Programmer, multimedia, Demonstration, and Handouts Education comprehension: verbalized understanding and returned demonstration  HOME EXERCISE PROGRAM: 6/17: Elbow AA/ROM, Wrist and Hand ROM 6/25: Table Slides and Pendulums 6/27: AA/ROM 7/3: A/ROM and Wall slides 7/16: Isometrics 7/18: Shoulder Strengthening with weights 7/26: Functional Reaching 8/1: Cross body, flexion, and er stretches  GOALS: Goals reviewed with patient? Yes  SHORT TERM GOALS: Target date: 10/26/22  Pt will be provided with and educated on HEP to improve mobility in LUE required for use during ADL completion.   Goal status: IN PROGRESS  2.  Pt will increase LUE P/ROM by 50 degrees to improve ability to use LUE during dressing tasks with minimal compensatory techniques.    Goal status:  IN PROGRESS  3.  Pt will increase LUE strength to 3+/5 to improve ability to reach for items at waist to chest height during bathing and grooming tasks.   Goal status: IN PROGRESS   LONG TERM GOALS: Target date: 11/30/22  Pt will decrease pain in LUE to 3/10 or less to improve ability to sleep for 2+ consecutive hours without waking due to pain.   Goal status: IN PROGRESS  2.  Pt will decrease LUE fascial restrictions to min amounts or less to improve mobility required for functional reaching tasks.    Goal status: IN PROGRESS  3.  Pt will increase LUE A/ROM to [facility] to improve ability to use LUE when reaching overhead or behind back during dressing and bathing tasks.  Goal status: IN PROGRESS  4.   Pt will increase LUE strength to 5/5 or greater to improve ability to use LUE when lifting or carrying items during meal preparation/housework/yardwork tasks.   Goal status: IN PROGRESS  5.  Pt will return to highest level of function using LUE as dominant during functional task completion.  Goal  status: IN PROGRESS   ASSESSMENT:  CLINICAL IMPRESSION: This session, pt continuing to have improving ROM, however the popping from last session continues to be present. He does not report any pain with the popping, only that he is noticing quite a bit now, where he had not had any since the surgery. Continuing to work with light weights and low level strengthening, within his tolerance. Adding stretches to continue pushing and working through muscle tension, limiting ROM. OT providing verbal and tactile cuing throughout session.   PERFORMANCE DEFICITS: in functional skills including ADLs, IADLs, ROM, strength, pain, fascial restrictions, Fine motor control, Gross motor control, body mechanics, and UE functional use.   PLAN:  OT FREQUENCY: 2x/week  OT DURATION: 8 weeks  PLANNED INTERVENTIONS: self care/ADL training, therapeutic exercise, therapeutic activity, manual therapy, passive range of motion, functional mobility training, electrical stimulation, ultrasound, moist heat, patient/family education, coping strategies training, and DME and/or AE instructions  CONSULTED AND AGREED WITH PLAN OF CARE: Patient  PLAN FOR NEXT SESSION: Manual Therapy, AA/ROM, A/ROM, Wall Slides, Isometrics, shoulder strengthening, proximal shoulder exercises   Trish Mage, OTR/L  [phone] 11/09/2022, 10:02 AM

## 2022-11-16 ENCOUNTER — Ambulatory Visit (HOSPITAL_COMMUNITY): Payer: 59 | Admitting: Occupational Therapy

## 2022-11-16 ENCOUNTER — Encounter (HOSPITAL_COMMUNITY): Payer: Self-pay | Admitting: Occupational Therapy

## 2022-11-16 DIAGNOSIS — R29898 Other symptoms and signs involving the musculoskeletal system: Secondary | ICD-10-CM

## 2022-11-16 DIAGNOSIS — M25612 Stiffness of left shoulder, not elsewhere classified: Secondary | ICD-10-CM | POA: Diagnosis not present

## 2022-11-16 DIAGNOSIS — M25512 Pain in left shoulder: Secondary | ICD-10-CM

## 2022-11-16 NOTE — Therapy (Signed)
OUTPATIENT OCCUPATIONAL THERAPY ORTHO TREATMENT NOTE  Patient Name: Blake [name] MRN: [id number] DOB:01-Nov-1980, 42 y.o., male Today's Date: 11/16/2022  PCP: Babs Sciara, MD REFERRING PROVIDER: Ramond Marrow, MD  END OF SESSION:  OT End of Session - 11/16/22 1423     Visit Number 10    Number of Visits 17    Date for OT Re-Evaluation 11/30/22    Authorization Type [employer]    Progress Note Due on Visit 10    OT Start Time 1345    OT Stop Time 1430    OT Time Calculation (min) 45 min    Activity Tolerance Patient tolerated treatment well    Behavior During Therapy [facility] for tasks assessed/performed                Past Medical History:  Diagnosis Date   Depression    Elevated ferritin 06/25/2019   High blood cholesterol level    Hypertension    Sleep apnea 01/07/2013   cpap   TIA (transient ischemic attack)    Tobacco abuse 11/19/2018   Patient working very hard at quitting   Type 2 diabetes mellitus (HCC) 11/19/2018   Past Surgical History:  Procedure Laterality Date   ABDOMINAL SURGERY     APPENDECTOMY     CHOLECYSTECTOMY     Patient Active Problem List   Diagnosis Date Noted   Depression, major, single episode, moderate (HCC) 05/07/2022   Transient ischemic attack (TIA) 09/25/2021   Essential hypertension 09/25/2021   Umbilical hernia without obstruction and without gangrene 02/21/2021   Elevated ferritin 06/25/2019   Type 2 diabetes mellitus (HCC) 11/19/2018   Tobacco abuse 11/19/2018   Depression, major, single episode, mild (HCC) 11/21/2016   Obesity (BMI 30.0-34.9) 05/19/2016   Obstructive sleep apnea 10/27/2013   Fatty liver 10/27/2013   Hypertriglyceridemia 12/15/2012    ONSET DATE: 09/19/22  REFERRING DIAG: L shoulder arthroscopy and Bicep Tenodesis  THERAPY DIAG:  Stiffness of left shoulder, not elsewhere classified  Other symptoms and signs involving the musculoskeletal system  Acute pain of left shoulder  Rationale for  Evaluation and Treatment: Rehabilitation  SUBJECTIVE:   SUBJECTIVE STATEMENT: S: "I went back to work this week and my shoulder feels tight."   PERTINENT HISTORY: PMH significant for HTN, Depression, TIA, DM2  PRECAUTIONS: Shoulder  WEIGHT BEARING RESTRICTIONS: Yes No weight  PAIN:  Are you having pain? Yes: NPRS scale: 3/10 Pain location: left anterior shoulder Pain description: sore Aggravating factors: certain movements Relieving factors: rest  FALLS: Has patient fallen in last 6 months? No  LIVING ENVIRONMENT: Lives with: lives with their family and lives with their spouse Lives in: House/apartment  PLOF: Independent  PATIENT GOALS: To get back to my old self  NEXT MD VISIT: unsure  OBJECTIVE:   HAND DOMINANCE: Right  ADLs: Overall ADLs: Mod Assist for all BADL's and Total assist for all IADL's. Unable to lift or carry any items at this time.   FUNCTIONAL OUTCOME MEASURES: FOTO: 44.23  UPPER EXTREMITY ROM:     Passive ROM Left eval  Shoulder flexion 119  Shoulder abduction 104  Shoulder internal rotation 90  Shoulder external rotation 13  Elbow flexion 138  Elbow extension -8  (Blank rows = not tested)  UPPER EXTREMITY MMT:     MMT Left eval  Shoulder flexion   Shoulder abduction   Shoulder adduction   Shoulder extension   Shoulder internal rotation   Shoulder external rotation   Elbow flexion  Elbow extension   (Blank rows = not tested)  SENSATION: WFL  EDEMA: None noted this session  OBSERVATIONS: Moderate to severe fascial restrictions noted in the biceps, anterior shoulder girdle, trapezius, and scapular region.   TODAY'S TREATMENT:                                                                                                                              DATE:   11/16/22 -Manual Therapy: myofascial release and trigger point applied to biceps, deltoid, scapular region, and trapezius in order to reduce pain and fascial restrictions  to improve ROM -A/ROM: seated- flexion, abduction, protraction, horizontal abduction, er/IR, x10 -Proximal shoulder strengthening: seated, 3lb dumbbell-paddles, criss cross, circles each direction, 12 reps each  -Shoulder strengthening: 3lb db; flexion, abduction, punches, shoulder press, horizontal abduction 10x each way -Stretching: cross the body stretch, flexion on the wall, er stretch, 2x15" -Overhead lacing: seated lacing up and then taking back out  -UBE: level 2 KPH 11+ 2.5 minutes forwards and backwards   11/08/22 -Manual Therapy: myofascial release and trigger point applied to biceps, deltoid, scapular region, and trapezius in order to reduce pain and fascial restrictions to improve ROM -A/ROM: seated- flexion, abduction, protraction, horizontal abduction, er/IR, x10 -Proximal shoulder strengthening: seated, 1lb dumbbell-paddles, criss cross, circles each direction, 12 reps each -X to V arms, x10 -Goal Post Arms, x10 -Latissimus stretch 4x15" -ABC's on the wall with red weighted ball -Stretching: cross the body stretch, flexion on the wall, er stretch, 2x15"  11/06/22 -Manual Therapy: myofascial release and trigger point applied to biceps, deltoid, scapular region, and trapezius in order to reduce pain and fascial restrictions to imp rove ROM -P/ROM: supine-flexion, abduction, er, 5 reps -A/ROM: supine- flexion, abduction, protraction, horizontal abduction, er/IR, x10 -Proximal shoulder strengthening: supine-paddles, criss cross, circles each direction, 10 reps each -A/ROM: standing- flexion, abduction, protraction, horizontal abduction, er/IR, x10 -Proximal shoulder strengthening: standing-paddles, criss cross, circles each direction, 10 reps each -X to V arms: 10 reps -shoulder stretches: IR behind back with horizontal towel, doorway stretch, 2x15", cross chest stretch, 1x15" -ABC writing: 90 degrees flexion, no weight -Proximal shoulder strengthening: on doorway using washcloth,  90 degrees flexion and abduction   PATIENT EDUCATION: Education details: Teacher, adult education, flexion, and er stretches Person educated: Patient Education method: Programmer, multimedia, Demonstration, and Handouts Education comprehension: verbalized understanding and returned demonstration  HOME EXERCISE PROGRAM: 6/17: Elbow AA/ROM, Wrist and Hand ROM 6/25: Table Slides and Pendulums 6/27: AA/ROM 7/3: A/ROM and Wall slides 7/16: Isometrics 7/18: Shoulder Strengthening with weights 7/26: Functional Reaching 8/1: Cross body, flexion, and er stretches  GOALS: Goals reviewed with patient? Yes  SHORT TERM GOALS: Target date: 10/26/22  Pt will be provided with and educated on HEP to improve mobility in LUE required for use during ADL completion.   Goal status: IN PROGRESS  2.  Pt will increase LUE P/ROM by 50 degrees to improve ability to use LUE during dressing tasks with minimal  compensatory techniques.    Goal status: IN PROGRESS  3.  Pt will increase LUE strength to 3+/5 to improve ability to reach for items at waist to chest height during bathing and grooming tasks.   Goal status: IN PROGRESS   LONG TERM GOALS: Target date: 11/30/22  Pt will decrease pain in LUE to 3/10 or less to improve ability to sleep for 2+ consecutive hours without waking due to pain.   Goal status: IN PROGRESS  2.  Pt will decrease LUE fascial restrictions to min amounts or less to improve mobility required for functional reaching tasks.    Goal status: IN PROGRESS  3.  Pt will increase LUE A/ROM to [facility] to improve ability to use LUE when reaching overhead or behind back during dressing and bathing tasks.  Goal status: IN PROGRESS  4.   Pt will increase LUE strength to 5/5 or greater to improve ability to use LUE when lifting or carrying items during meal preparation/housework/yardwork tasks.   Goal status: IN PROGRESS  5.  Pt will return to highest level of function using LUE as dominant during functional task  completion.  Goal status: IN PROGRESS   ASSESSMENT:  CLINICAL IMPRESSION: Pt reports continued popping in L shoulder when coming down from ROM exercises, he states that is does not happen every time but often. He reports no pain when this is happening. During ROM exercises pt was able to achieve 85% ROM in sitting position. Pt reports completing HEP with 2lb db at home with ease- increased weight with exercises in clinic to 3lb. VC throughout session for body positioning.   PERFORMANCE DEFICITS: in functional skills including ADLs, IADLs, ROM, strength, pain, fascial restrictions, Fine motor control, Gross motor control, body mechanics, and UE functional use.   PLAN:  OT FREQUENCY: 2x/week  OT DURATION: 8 weeks  PLANNED INTERVENTIONS: self care/ADL training, therapeutic exercise, therapeutic activity, manual therapy, passive range of motion, functional mobility training, electrical stimulation, ultrasound, moist heat, patient/family education, coping strategies training, and DME and/or AE instructions  CONSULTED AND AGREED WITH PLAN OF CARE: Patient  PLAN FOR NEXT SESSION: Manual Therapy, AA/ROM, A/ROM, Wall Slides, Isometrics, shoulder strengthening, proximal shoulder exercises   Lurena Joiner, OTR/L  [phone] 11/16/2022, 2:41 PM

## 2022-11-19 ENCOUNTER — Ambulatory Visit (HOSPITAL_COMMUNITY): Payer: 59 | Admitting: Occupational Therapy

## 2022-11-19 ENCOUNTER — Encounter (HOSPITAL_COMMUNITY): Payer: Self-pay | Admitting: Occupational Therapy

## 2022-11-19 DIAGNOSIS — M25612 Stiffness of left shoulder, not elsewhere classified: Secondary | ICD-10-CM | POA: Diagnosis not present

## 2022-11-19 DIAGNOSIS — M25512 Pain in left shoulder: Secondary | ICD-10-CM

## 2022-11-19 DIAGNOSIS — R29898 Other symptoms and signs involving the musculoskeletal system: Secondary | ICD-10-CM

## 2022-11-19 NOTE — Therapy (Signed)
OUTPATIENT OCCUPATIONAL THERAPY ORTHO TREATMENT NOTE  Patient Name: Blake [name] MRN: [id number] DOB:12-17-1980, 42 y.o., male Today's Date: 11/19/2022  PCP: Babs Sciara, MD REFERRING PROVIDER: Ramond Marrow, MD  END OF SESSION:  OT End of Session - 11/19/22 0814     Visit Number 11    Number of Visits 17    Date for OT Re-Evaluation 11/30/22    Authorization Type [employer]    Progress Note Due on Visit 10    OT Start Time 0736    OT Stop Time 0816    OT Time Calculation (min) 40 min    Activity Tolerance Patient tolerated treatment well    Behavior During Therapy [facility] for tasks assessed/performed            Past Medical History:  Diagnosis Date   Depression    Elevated ferritin 06/25/2019   High blood cholesterol level    Hypertension    Sleep apnea 01/07/2013   cpap   TIA (transient ischemic attack)    Tobacco abuse 11/19/2018   Patient working very hard at quitting   Type 2 diabetes mellitus (HCC) 11/19/2018   Past Surgical History:  Procedure Laterality Date   ABDOMINAL SURGERY     APPENDECTOMY     CHOLECYSTECTOMY     Patient Active Problem List   Diagnosis Date Noted   Depression, major, single episode, moderate (HCC) 05/07/2022   Transient ischemic attack (TIA) 09/25/2021   Essential hypertension 09/25/2021   Umbilical hernia without obstruction and without gangrene 02/21/2021   Elevated ferritin 06/25/2019   Type 2 diabetes mellitus (HCC) 11/19/2018   Tobacco abuse 11/19/2018   Depression, major, single episode, mild (HCC) 11/21/2016   Obesity (BMI 30.0-34.9) 05/19/2016   Obstructive sleep apnea 10/27/2013   Fatty liver 10/27/2013   Hypertriglyceridemia 12/15/2012    ONSET DATE: 09/19/22  REFERRING DIAG: L shoulder arthroscopy and Bicep Tenodesis  THERAPY DIAG:  Stiffness of left shoulder, not elsewhere classified  Acute pain of left shoulder  Other symptoms and signs involving the musculoskeletal system  Rationale for Evaluation and  Treatment: Rehabilitation  SUBJECTIVE:   SUBJECTIVE STATEMENT: S: "I went back to work this week and my shoulder feels tight."   PERTINENT HISTORY: PMH significant for HTN, Depression, TIA, DM2  PRECAUTIONS: Shoulder  WEIGHT BEARING RESTRICTIONS: Yes No weight  PAIN:  Are you having pain? Yes: NPRS scale: 3/10 Pain location: left anterior shoulder Pain description: sore Aggravating factors: certain movements Relieving factors: rest  FALLS: Has patient fallen in last 6 months? No  LIVING ENVIRONMENT: Lives with: lives with their family and lives with their spouse Lives in: House/apartment  PLOF: Independent  PATIENT GOALS: To get back to my old self  NEXT MD VISIT: unsure  OBJECTIVE:   HAND DOMINANCE: Right  ADLs: Overall ADLs: Mod Assist for all BADL's and Total assist for all IADL's. Unable to lift or carry any items at this time.   FUNCTIONAL OUTCOME MEASURES: FOTO: 44.23  UPPER EXTREMITY ROM:     Passive ROM Left eval  Shoulder flexion 119  Shoulder abduction 104  Shoulder internal rotation 90  Shoulder external rotation 13  Elbow flexion 138  Elbow extension -8  (Blank rows = not tested)  UPPER EXTREMITY MMT:     MMT Left eval  Shoulder flexion   Shoulder abduction   Shoulder adduction   Shoulder extension   Shoulder internal rotation   Shoulder external rotation   Elbow flexion   Elbow extension   (  Blank rows = not tested)  SENSATION: WFL  EDEMA: None noted this session  OBSERVATIONS: Moderate to severe fascial restrictions noted in the biceps, anterior shoulder girdle, trapezius, and scapular region.   TODAY'S TREATMENT:                                                                                                                              DATE:   11/19/22 -Manual Therapy: myofascial release and trigger point applied to biceps, deltoid, scapular region, and trapezius in order to reduce pain and fascial restrictions to improve  ROM -A/ROM: seated- flexion, abduction, protraction, horizontal abduction, er/IR, x15 -X to V arms, x12 -Goal Post arms, x12 -Scapular Strengthening: green band, extension, retraction, protraction, rows, x12 -Functional reaching: 2lb wrist weight, 10 cones from counter to shoulder shelf to head shelf to overhead shelf in flexion, then bringing them down in abduction -Therapy Ball Exercises: flexion, protraction, circles both directions, x10 -Wall Slides: flexion, abduction, x10  11/16/22 -Manual Therapy: myofascial release and trigger point applied to biceps, deltoid, scapular region, and trapezius in order to reduce pain and fascial restrictions to improve ROM -A/ROM: seated- flexion, abduction, protraction, horizontal abduction, er/IR, x10 -Proximal shoulder strengthening: seated, 3lb dumbbell-paddles, criss cross, circles each direction, 12 reps each  -Shoulder strengthening: 3lb db; flexion, abduction, punches, shoulder press, horizontal abduction 10x each way -Stretching: cross the body stretch, flexion on the wall, er stretch, 2x15" -Overhead lacing: seated lacing up and then taking back out  -UBE: level 2 KPH 11+ 2.5 minutes forwards and backwards   11/08/22 -Manual Therapy: myofascial release and trigger point applied to biceps, deltoid, scapular region, and trapezius in order to reduce pain and fascial restrictions to improve ROM -A/ROM: seated- flexion, abduction, protraction, horizontal abduction, er/IR, x10 -Proximal shoulder strengthening: seated, 1lb dumbbell-paddles, criss cross, circles each direction, 12 reps each -X to V arms, x10 -Goal Post Arms, x10 -Latissimus stretch 4x15" -ABC's on the wall with red weighted ball -Stretching: cross the body stretch, flexion on the wall, er stretch, 2x15"   PATIENT EDUCATION: Education details: Publishing rights manager Person educated: Patient Education method: Explanation, Demonstration, and Handouts Education comprehension:  verbalized understanding and returned demonstration  HOME EXERCISE PROGRAM: 6/17: Elbow AA/ROM, Wrist and Hand ROM 6/25: Table Slides and Pendulums 6/27: AA/ROM 7/3: A/ROM and Wall slides 7/16: Isometrics 7/18: Shoulder Strengthening with weights 7/26: Functional Reaching 8/1: Cross body, flexion, and er stretches 8/12: Scapular strengthening (green)  GOALS: Goals reviewed with patient? Yes  SHORT TERM GOALS: Target date: 10/26/22  Pt will be provided with and educated on HEP to improve mobility in LUE required for use during ADL completion.   Goal status: IN PROGRESS  2.  Pt will increase LUE P/ROM by 50 degrees to improve ability to use LUE during dressing tasks with minimal compensatory techniques.    Goal status: IN PROGRESS  3.  Pt will increase LUE strength to 3+/5 to improve ability to reach for items  at waist to chest height during bathing and grooming tasks.   Goal status: IN PROGRESS   LONG TERM GOALS: Target date: 11/30/22  Pt will decrease pain in LUE to 3/10 or less to improve ability to sleep for 2+ consecutive hours without waking due to pain.   Goal status: IN PROGRESS  2.  Pt will decrease LUE fascial restrictions to min amounts or less to improve mobility required for functional reaching tasks.    Goal status: IN PROGRESS  3.  Pt will increase LUE A/ROM to [facility] to improve ability to use LUE when reaching overhead or behind back during dressing and bathing tasks.  Goal status: IN PROGRESS  4.   Pt will increase LUE strength to 5/5 or greater to improve ability to use LUE when lifting or carrying items during meal preparation/housework/yardwork tasks.   Goal status: IN PROGRESS  5.  Pt will return to highest level of function using LUE as dominant during functional task completion.  Goal status: IN PROGRESS   ASSESSMENT:  CLINICAL IMPRESSION: This session pt continuing to have mild popping in flexion and x to v arms, however no pain associated  with these pops. OT initiated theraband exercises this session for scapular strengthening and stabilization. He noted mild discomfort in the bicep with retraction and rows, however was able to complete all exercises of the set. Additionally he completed functional reaching this session, requiring a rest before reaching overhead shelf and after getting them down to the head height shelf. Pt fatigued towards the end of the session requiring increased rest breaks and ending session. Verbal and visual cuing provided for positioning and technique throughout entire session.   PERFORMANCE DEFICITS: in functional skills including ADLs, IADLs, ROM, strength, pain, fascial restrictions, Fine motor control, Gross motor control, body mechanics, and UE functional use.   PLAN:  OT FREQUENCY: 2x/week  OT DURATION: 8 weeks  PLANNED INTERVENTIONS: self care/ADL training, therapeutic exercise, therapeutic activity, manual therapy, passive range of motion, functional mobility training, electrical stimulation, ultrasound, moist heat, patient/family education, coping strategies training, and DME and/or AE instructions  CONSULTED AND AGREED WITH PLAN OF CARE: Patient  PLAN FOR NEXT SESSION: Manual Therapy, A/ROM, shoulder strengthening, proximal shoulder exercises, scapular strengthening, start shoulder strengthening with bands   Trish Mage, OTR/L  [phone] 11/19/2022, 8:15 AM

## 2022-11-19 NOTE — Patient Instructions (Signed)

## 2022-11-22 ENCOUNTER — Ambulatory Visit (HOSPITAL_COMMUNITY): Payer: 59 | Admitting: Occupational Therapy

## 2022-11-22 ENCOUNTER — Encounter (HOSPITAL_COMMUNITY): Payer: Self-pay | Admitting: Occupational Therapy

## 2022-11-22 DIAGNOSIS — M25612 Stiffness of left shoulder, not elsewhere classified: Secondary | ICD-10-CM | POA: Diagnosis not present

## 2022-11-22 DIAGNOSIS — R29898 Other symptoms and signs involving the musculoskeletal system: Secondary | ICD-10-CM

## 2022-11-22 DIAGNOSIS — M25512 Pain in left shoulder: Secondary | ICD-10-CM

## 2022-11-22 NOTE — Therapy (Signed)
OUTPATIENT OCCUPATIONAL THERAPY ORTHO TREATMENT NOTE  Patient Name: Blake [name] MRN: [id number] DOB:28-Jun-1980, 42 y.o., male Today's Date: 11/22/2022  PCP: Babs Sciara, MD REFERRING PROVIDER: Ramond Marrow, MD  END OF SESSION:  OT End of Session - 11/22/22 0815     Visit Number 12    Number of Visits 17    Date for OT Re-Evaluation 11/30/22    Authorization Type [employer]    Progress Note Due on Visit 10    OT Start Time 0735    OT Stop Time 0815    OT Time Calculation (min) 40 min    Activity Tolerance Patient tolerated treatment well    Behavior During Therapy [facility] for tasks assessed/performed             Past Medical History:  Diagnosis Date   Depression    Elevated ferritin 06/25/2019   High blood cholesterol level    Hypertension    Sleep apnea 01/07/2013   cpap   TIA (transient ischemic attack)    Tobacco abuse 11/19/2018   Patient working very hard at quitting   Type 2 diabetes mellitus (HCC) 11/19/2018   Past Surgical History:  Procedure Laterality Date   ABDOMINAL SURGERY     APPENDECTOMY     CHOLECYSTECTOMY     Patient Active Problem List   Diagnosis Date Noted   Depression, major, single episode, moderate (HCC) 05/07/2022   Transient ischemic attack (TIA) 09/25/2021   Essential hypertension 09/25/2021   Umbilical hernia without obstruction and without gangrene 02/21/2021   Elevated ferritin 06/25/2019   Type 2 diabetes mellitus (HCC) 11/19/2018   Tobacco abuse 11/19/2018   Depression, major, single episode, mild (HCC) 11/21/2016   Obesity (BMI 30.0-34.9) 05/19/2016   Obstructive sleep apnea 10/27/2013   Fatty liver 10/27/2013   Hypertriglyceridemia 12/15/2012    ONSET DATE: 09/19/22  REFERRING DIAG: L shoulder arthroscopy and Bicep Tenodesis  THERAPY DIAG:  Stiffness of left shoulder, not elsewhere classified  Acute pain of left shoulder  Other symptoms and signs involving the musculoskeletal system  Rationale for Evaluation  and Treatment: Rehabilitation  SUBJECTIVE:   SUBJECTIVE STATEMENT: S: "I went back to work this week and my shoulder feels tight."   PERTINENT HISTORY: PMH significant for HTN, Depression, TIA, DM2  PRECAUTIONS: Shoulder  WEIGHT BEARING RESTRICTIONS: Yes No weight  PAIN:  Are you having pain? Yes: NPRS scale: 3/10 Pain location: left anterior shoulder Pain description: sore Aggravating factors: certain movements Relieving factors: rest  FALLS: Has patient fallen in last 6 months? No  LIVING ENVIRONMENT: Lives with: lives with their family and lives with their spouse Lives in: House/apartment  PLOF: Independent  PATIENT GOALS: To get back to my old self  NEXT MD VISIT: unsure  OBJECTIVE:   HAND DOMINANCE: Right  ADLs: Overall ADLs: Mod Assist for all BADL's and Total assist for all IADL's. Unable to lift or carry any items at this time.   FUNCTIONAL OUTCOME MEASURES: FOTO: 44.23  UPPER EXTREMITY ROM:     Passive ROM Left eval  Shoulder flexion 119  Shoulder abduction 104  Shoulder internal rotation 90  Shoulder external rotation 13  Elbow flexion 138  Elbow extension -8  (Blank rows = not tested)  UPPER EXTREMITY MMT:     MMT Left eval  Shoulder flexion   Shoulder abduction   Shoulder adduction   Shoulder extension   Shoulder internal rotation   Shoulder external rotation   Elbow flexion   Elbow extension   (  Blank rows = not tested)  SENSATION: WFL  EDEMA: None noted this session  OBSERVATIONS: Moderate to severe fascial restrictions noted in the biceps, anterior shoulder girdle, trapezius, and scapular region.   TODAY'S TREATMENT:                                                                                                                              DATE:   11/22/22 -A/ROM: standing, 2lb dumbbells- flexion, abduction, protraction, horizontal abduction, er/IR, x15 -X to V arms, x12, 2lb dumbbells -Goal Post arms, x12, 2lb  dumbbells -Proximal Shoulder exercises: 2lb dumbbells, paddles, criss cross, circles each direction, x12 -Ball on the wall ABC's -Therapy Ball Exercises: flexion, protraction, overhead presses, V ups, circles both directions, x10 -Overhead lacing -Scapular Strengthening: green band, extension, retraction, protraction, rows, x12 -Shoulder strengthening: green band, horizontal abduction, er, ir, flexion, abduction, x12 -UBE: Level 4, 5.0 KPH+, 3' forwards and backwards  11/19/22 -Manual Therapy: myofascial release and trigger point applied to biceps, deltoid, scapular region, and trapezius in order to reduce pain and fascial restrictions to improve ROM -A/ROM: seated- flexion, abduction, protraction, horizontal abduction, er/IR, x15 -X to V arms, x12 -Goal Post arms, x12 -Scapular Strengthening: green band, extension, retraction, protraction, rows, x12 -Functional reaching: 2lb wrist weight, 10 cones from counter to shoulder shelf to head shelf to overhead shelf in flexion, then bringing them down in abduction -Therapy Ball Exercises: flexion, protraction, circles both directions, x10 -Wall Slides: flexion, abduction, x10  11/16/22 -Manual Therapy: myofascial release and trigger point applied to biceps, deltoid, scapular region, and trapezius in order to reduce pain and fascial restrictions to improve ROM -A/ROM: seated- flexion, abduction, protraction, horizontal abduction, er/IR, x10 -Proximal shoulder strengthening: seated, 3lb dumbbell-paddles, criss cross, circles each direction, 12 reps each  -Shoulder strengthening: 3lb db; flexion, abduction, punches, shoulder press, horizontal abduction 10x each way -Stretching: cross the body stretch, flexion on the wall, er stretch, 2x15" -Overhead lacing: seated lacing up and then taking back out  -UBE: level 2 KPH 11+ 2.5 minutes forwards and backwards    PATIENT EDUCATION: Education details: Continue HEP Person educated: Patient Education  method: Programmer, multimedia, Facilities manager, and Handouts Education comprehension: verbalized understanding and returned demonstration  HOME EXERCISE PROGRAM: 6/17: Elbow AA/ROM, Wrist and Hand ROM 6/25: Table Slides and Pendulums 6/27: AA/ROM 7/3: A/ROM and Wall slides 7/16: Isometrics 7/18: Shoulder Strengthening with weights 7/26: Functional Reaching 8/1: Cross body, flexion, and er stretches 8/12: Scapular strengthening (green)  GOALS: Goals reviewed with patient? Yes  SHORT TERM GOALS: Target date: 10/26/22  Pt will be provided with and educated on HEP to improve mobility in LUE required for use during ADL completion.   Goal status: IN PROGRESS  2.  Pt will increase LUE P/ROM by 50 degrees to improve ability to use LUE during dressing tasks with minimal compensatory techniques.    Goal status: IN PROGRESS  3.  Pt will increase LUE strength to 3+/5 to improve ability  to reach for items at waist to chest height during bathing and grooming tasks.   Goal status: IN PROGRESS   LONG TERM GOALS: Target date: 11/30/22  Pt will decrease pain in LUE to 3/10 or less to improve ability to sleep for 2+ consecutive hours without waking due to pain.   Goal status: IN PROGRESS  2.  Pt will decrease LUE fascial restrictions to min amounts or less to improve mobility required for functional reaching tasks.    Goal status: IN PROGRESS  3.  Pt will increase LUE A/ROM to [facility] to improve ability to use LUE when reaching overhead or behind back during dressing and bathing tasks.  Goal status: IN PROGRESS  4.   Pt will increase LUE strength to 5/5 or greater to improve ability to use LUE when lifting or carrying items during meal preparation/housework/yardwork tasks.   Goal status: IN PROGRESS  5.  Pt will return to highest level of function using LUE as dominant during functional task completion.  Goal status: IN PROGRESS   ASSESSMENT:  CLINICAL IMPRESSION: Pt completing all exercises  with good movement pattern this session. He reports moderate muscle fatigue and soreness with all exercises, increasing in intensity as session continued. OT initiated shoulder strengthening and therapy ball exercises to continue progressing strength and overall mobility. Pt continues to have the most difficulty with abduction and horizontal abduction, requiring breaks and less resistance to complete the tasks. Verbal and visual cuing provided throughout session for positioning and technique.   PERFORMANCE DEFICITS: in functional skills including ADLs, IADLs, ROM, strength, pain, fascial restrictions, Fine motor control, Gross motor control, body mechanics, and UE functional use.   PLAN:  OT FREQUENCY: 2x/week  OT DURATION: 8 weeks  PLANNED INTERVENTIONS: self care/ADL training, therapeutic exercise, therapeutic activity, manual therapy, passive range of motion, functional mobility training, electrical stimulation, ultrasound, moist heat, patient/family education, coping strategies training, and DME and/or AE instructions  CONSULTED AND AGREED WITH PLAN OF CARE: Patient  PLAN FOR NEXT SESSION: Manual Therapy, A/ROM, shoulder strengthening, proximal shoulder exercises, scapular strengthening, start shoulder strengthening with bands   Trish Mage, OTR/L  [phone] 11/22/2022, 8:16 AM

## 2022-11-26 ENCOUNTER — Ambulatory Visit: Payer: PRIVATE HEALTH INSURANCE | Admitting: Adult Health

## 2022-11-27 ENCOUNTER — Encounter (HOSPITAL_COMMUNITY): Payer: Self-pay | Admitting: Occupational Therapy

## 2022-11-27 ENCOUNTER — Ambulatory Visit (HOSPITAL_COMMUNITY): Payer: 59 | Admitting: Occupational Therapy

## 2022-11-27 DIAGNOSIS — M25612 Stiffness of left shoulder, not elsewhere classified: Secondary | ICD-10-CM | POA: Diagnosis not present

## 2022-11-27 DIAGNOSIS — M25512 Pain in left shoulder: Secondary | ICD-10-CM

## 2022-11-27 DIAGNOSIS — R29898 Other symptoms and signs involving the musculoskeletal system: Secondary | ICD-10-CM

## 2022-11-27 NOTE — Therapy (Unsigned)
OUTPATIENT OCCUPATIONAL THERAPY ORTHO TREATMENT NOTE  Patient Name: LORENA ZINS MRN: [id number] DOB:1980/09/28, 42 y.o., male Today's Date: 11/28/2022  PCP: Babs Sciara, MD REFERRING PROVIDER: Ramond Marrow, MD  END OF SESSION:  OT End of Session - 11/27/22 1345     Visit Number 13    Number of Visits 17    Date for OT Re-Evaluation 11/30/22    Authorization Type [employer]    Progress Note Due on Visit 10    OT Start Time 1305    OT Stop Time 1346    OT Time Calculation (min) 41 min    Activity Tolerance Patient tolerated treatment well    Behavior During Therapy [facility] for tasks assessed/performed             Past Medical History:  Diagnosis Date   Depression    Elevated ferritin 06/25/2019   High blood cholesterol level    Hypertension    Sleep apnea 01/07/2013   cpap   TIA (transient ischemic attack)    Tobacco abuse 11/19/2018   Patient working very hard at quitting   Type 2 diabetes mellitus (HCC) 11/19/2018   Past Surgical History:  Procedure Laterality Date   ABDOMINAL SURGERY     APPENDECTOMY     CHOLECYSTECTOMY     Patient Active Problem List   Diagnosis Date Noted   Depression, major, single episode, moderate (HCC) 05/07/2022   Transient ischemic attack (TIA) 09/25/2021   Essential hypertension 09/25/2021   Umbilical hernia without obstruction and without gangrene 02/21/2021   Elevated ferritin 06/25/2019   Type 2 diabetes mellitus (HCC) 11/19/2018   Tobacco abuse 11/19/2018   Depression, major, single episode, mild (HCC) 11/21/2016   Obesity (BMI 30.0-34.9) 05/19/2016   Obstructive sleep apnea 10/27/2013   Fatty liver 10/27/2013   Hypertriglyceridemia 12/15/2012    ONSET DATE: 09/19/22  REFERRING DIAG: L shoulder arthroscopy and Bicep Tenodesis  THERAPY DIAG:  Stiffness of left shoulder, not elsewhere classified  Acute pain of left shoulder  Other symptoms and signs involving the musculoskeletal system  Rationale for Evaluation  and Treatment: Rehabilitation  SUBJECTIVE:   SUBJECTIVE STATEMENT: S: "Something bit me and I jumped and ever since then my shoulder has been hurting badly."   PERTINENT HISTORY: PMH significant for HTN, Depression, TIA, DM2  PRECAUTIONS: Shoulder  WEIGHT BEARING RESTRICTIONS: Yes No weight  PAIN:  Are you having pain? Yes: NPRS scale: 3/10 Pain location: left anterior shoulder Pain description: sore Aggravating factors: certain movements Relieving factors: rest  FALLS: Has patient fallen in last 6 months? No  LIVING ENVIRONMENT: Lives with: lives with their family and lives with their spouse Lives in: House/apartment  PLOF: Independent  PATIENT GOALS: To get back to my old self  NEXT MD VISIT: unsure  OBJECTIVE:   HAND DOMINANCE: Right  ADLs: Overall ADLs: Mod Assist for all BADL's and Total assist for all IADL's. Unable to lift or carry any items at this time.   FUNCTIONAL OUTCOME MEASURES: FOTO: 44.23  UPPER EXTREMITY ROM:     Passive ROM Left eval  Shoulder flexion 119  Shoulder abduction 104  Shoulder internal rotation 90  Shoulder external rotation 13  Elbow flexion 138  Elbow extension -8  (Blank rows = not tested)  UPPER EXTREMITY MMT:     MMT Left eval  Shoulder flexion   Shoulder abduction   Shoulder adduction   Shoulder extension   Shoulder internal rotation   Shoulder external rotation   Elbow flexion  Elbow extension   (Blank rows = not tested)  SENSATION: WFL  EDEMA: None noted this session  OBSERVATIONS: Moderate to severe fascial restrictions noted in the biceps, anterior shoulder girdle, trapezius, and scapular region.   TODAY'S TREATMENT:                                                                                                                              DATE:   11/27/22 -Manual Therapy: myofascial release and trigger point applied to biceps, deltoid, scapular region, and trapezius in order to reduce pain and  fascial restrictions to improve ROM -A/ROM: standing, flexion, abduction, protraction, horizontal abduction, er/IR, x15 -Shoulder Strengthening: 3lb dumbbells, flexion, abduction, protraction, horizontal abduction, er/IR, x10 -X to V arms, x12, 3lb dumbbells -Goal Post arms, x12, 3lb dumbbells -PNF Strengthening: green band, chest pulls, overhead pulls, er pulls, PNF up, PNF down, x12 -loop Band Exercises: green band, wall taps, V ups, Wall Clocks, x12  11/22/22 -A/ROM: standing, 2lb dumbbells- flexion, abduction, protraction, horizontal abduction, er/IR, x15 -X to V arms, x12, 2lb dumbbells -Goal Post arms, x12, 2lb dumbbells -Proximal Shoulder exercises: 2lb dumbbells, paddles, criss cross, circles each direction, x12 -Ball on the wall ABC's -Therapy Ball Exercises: flexion, protraction, overhead presses, V ups, circles both directions, x10 -Overhead lacing -Scapular Strengthening: green band, extension, retraction, protraction, rows, x12 -Shoulder strengthening: green band, horizontal abduction, er, ir, flexion, abduction, x12 -UBE: Level 4, 5.0 KPH+, 3' forwards and backwards  11/19/22 -Manual Therapy: myofascial release and trigger point applied to biceps, deltoid, scapular region, and trapezius in order to reduce pain and fascial restrictions to improve ROM -A/ROM: seated- flexion, abduction, protraction, horizontal abduction, er/IR, x15 -X to V arms, x12 -Goal Post arms, x12 -Scapular Strengthening: green band, extension, retraction, protraction, rows, x12 -Functional reaching: 2lb wrist weight, 10 cones from counter to shoulder shelf to head shelf to overhead shelf in flexion, then bringing them down in abduction -Therapy Ball Exercises: flexion, protraction, circles both directions, x10 -Wall Slides: flexion, abduction, x10   PATIENT EDUCATION: Education details: PNF Strengthening Person educated: Patient Education method: Programmer, multimedia, Facilities manager, and Handouts Education  comprehension: verbalized understanding and returned demonstration  HOME EXERCISE PROGRAM: 6/17: Elbow AA/ROM, Wrist and Hand ROM 6/25: Table Slides and Pendulums 6/27: AA/ROM 7/3: A/ROM and Wall slides 7/16: Isometrics 7/18: Shoulder Strengthening with weights 7/26: Functional Reaching 8/1: Cross body, flexion, and er stretches 8/12: Scapular strengthening (green) 8/20: PNF Strengthening (Green)  GOALS: Goals reviewed with patient? Yes  SHORT TERM GOALS: Target date: 10/26/22  Pt will be provided with and educated on HEP to improve mobility in LUE required for use during ADL completion.   Goal status: IN PROGRESS  2.  Pt will increase LUE P/ROM by 50 degrees to improve ability to use LUE during dressing tasks with minimal compensatory techniques.    Goal status: IN PROGRESS  3.  Pt will increase LUE strength to 3+/5 to improve ability to reach for items  at waist to chest height during bathing and grooming tasks.   Goal status: IN PROGRESS   LONG TERM GOALS: Target date: 11/30/22  Pt will decrease pain in LUE to 3/10 or less to improve ability to sleep for 2+ consecutive hours without waking due to pain.   Goal status: IN PROGRESS  2.  Pt will decrease LUE fascial restrictions to min amounts or less to improve mobility required for functional reaching tasks.    Goal status: IN PROGRESS  3.  Pt will increase LUE A/ROM to [facility] to improve ability to use LUE when reaching overhead or behind back during dressing and bathing tasks.  Goal status: IN PROGRESS  4.   Pt will increase LUE strength to 5/5 or greater to improve ability to use LUE when lifting or carrying items during meal preparation/housework/yardwork tasks.   Goal status: IN PROGRESS  5.  Pt will return to highest level of function using LUE as dominant during functional task completion.  Goal status: IN PROGRESS   ASSESSMENT:  CLINICAL IMPRESSION: This session, pt demonstrating improving ROM and strength.  Initially OT had pt start with A/ROM, however he had significant pain with movement, so manual therapy was completed to assist with reducing the pain. After manual therapy and starting slow and controlled movements, he was able to complete full ROM and tolerate increasing to 3lb dumbbells for strengthening. Additionally, pt started PNF strengthening and loop band exercises this session, both of which increased fatigue and need for rest breaks, however did not increase pain. OT providing verbal and tactile cuing for positioning and technique throughout session.   PERFORMANCE DEFICITS: in functional skills including ADLs, IADLs, ROM, strength, pain, fascial restrictions, Fine motor control, Gross motor control, body mechanics, and UE functional use.   PLAN:  OT FREQUENCY: 2x/week  OT DURATION: 8 weeks  PLANNED INTERVENTIONS: self care/ADL training, therapeutic exercise, therapeutic activity, manual therapy, passive range of motion, functional mobility training, electrical stimulation, ultrasound, moist heat, patient/family education, coping strategies training, and DME and/or AE instructions  CONSULTED AND AGREED WITH PLAN OF CARE: Patient  PLAN FOR NEXT SESSION: Manual Therapy, A/ROM, shoulder strengthening, proximal shoulder exercises, scapular strengthening, start shoulder strengthening with bands   Trish Mage, OTR/L  [phone] 11/28/2022, 3:33 PM

## 2022-11-27 NOTE — Patient Instructions (Signed)

## 2022-11-29 ENCOUNTER — Encounter (HOSPITAL_COMMUNITY): Payer: Self-pay | Admitting: Occupational Therapy

## 2022-11-29 ENCOUNTER — Ambulatory Visit (HOSPITAL_COMMUNITY): Payer: 59 | Admitting: Occupational Therapy

## 2022-11-29 DIAGNOSIS — M25612 Stiffness of left shoulder, not elsewhere classified: Secondary | ICD-10-CM

## 2022-11-29 DIAGNOSIS — R29898 Other symptoms and signs involving the musculoskeletal system: Secondary | ICD-10-CM

## 2022-11-29 DIAGNOSIS — M25512 Pain in left shoulder: Secondary | ICD-10-CM

## 2022-11-29 NOTE — Patient Instructions (Signed)

## 2022-11-29 NOTE — Therapy (Signed)
OUTPATIENT OCCUPATIONAL THERAPY ORTHO TREATMENT NOTE AND DISCHARGE NOTE  Patient Name: Blake [name] MRN: [id number] DOB:03/26/1981, 42 y.o., male Today's Date: 11/29/2022  PCP: Babs Sciara, MD REFERRING PROVIDER: Ramond Marrow, MD  OCCUPATIONAL THERAPY DISCHARGE SUMMARY  Visits from Start of Care: 14  Current functional level related to goals / functional outcomes: Pt has met all OT goals. His strength and ROM are WFL, with mild intermittent pain and minimal fascial restrictions.    Remaining deficits: Pt has no remaining deficits.    Education / Equipment: Pt has been provided a comprehensive HEP and theraband's to continue progressing at home.    Plan: Patient agrees to discharge as all OT goals were met.     END OF SESSION:  OT End of Session - 11/29/22 1445     Visit Number 14    Number of Visits 17    Date for OT Re-Evaluation 11/30/22    Authorization Type [employer]    Progress Note Due on Visit 10    OT Start Time 1312    OT Stop Time 1350    OT Time Calculation (min) 38 min    Activity Tolerance Patient tolerated treatment well    Behavior During Therapy WFL for tasks assessed/performed             Past Medical History:  Diagnosis Date   Depression    Elevated ferritin 06/25/2019   High blood cholesterol level    Hypertension    Sleep apnea 01/07/2013   cpap   TIA (transient ischemic attack)    Tobacco abuse 11/19/2018   Patient working very hard at quitting   Type 2 diabetes mellitus (HCC) 11/19/2018   Past Surgical History:  Procedure Laterality Date   ABDOMINAL SURGERY     APPENDECTOMY     CHOLECYSTECTOMY     Patient Active Problem List   Diagnosis Date Noted   Depression, major, single episode, moderate (HCC) 05/07/2022   Transient ischemic attack (TIA) 09/25/2021   Essential hypertension 09/25/2021   Umbilical hernia without obstruction and without gangrene 02/21/2021   Elevated ferritin 06/25/2019   Type 2 diabetes mellitus  (HCC) 11/19/2018   Tobacco abuse 11/19/2018   Depression, major, single episode, mild (HCC) 11/21/2016   Obesity (BMI 30.0-34.9) 05/19/2016   Obstructive sleep apnea 10/27/2013   Fatty liver 10/27/2013   Hypertriglyceridemia 12/15/2012    ONSET DATE: 09/19/22  REFERRING DIAG: L shoulder arthroscopy and Bicep Tenodesis  THERAPY DIAG:  Stiffness of left shoulder, not elsewhere classified  Acute pain of left shoulder  Other symptoms and signs involving the musculoskeletal system  Rationale for Evaluation and Treatment: Rehabilitation  SUBJECTIVE:   SUBJECTIVE STATEMENT: S: "Something bit me and I jumped and ever since then my shoulder has been hurting badly."   PERTINENT HISTORY: PMH significant for HTN, Depression, TIA, DM2  PRECAUTIONS: Shoulder  WEIGHT BEARING RESTRICTIONS: Yes No weight  PAIN:  Are you having pain? Yes: NPRS scale: 3/10 Pain location: left anterior shoulder Pain description: sore Aggravating factors: certain movements Relieving factors: rest  FALLS: Has patient fallen in last 6 months? No  LIVING ENVIRONMENT: Lives with: lives with their family and lives with their spouse Lives in: House/apartment  PLOF: Independent  PATIENT GOALS: To get back to my old self  NEXT MD VISIT: unsure  OBJECTIVE:   HAND DOMINANCE: Right  ADLs: Overall ADLs: Mod Assist for all BADL's and Total assist for all IADL's. Unable to lift or carry any items at this time.  FUNCTIONAL OUTCOME MEASURES: FOTO: 44.23 11/29/22: 74.05  UPPER EXTREMITY ROM:     Passive ROM Left eval Left 11/29/22  Shoulder flexion 119 160  Shoulder abduction 104 152  Shoulder internal rotation 90 90  Shoulder external rotation 13 49  Elbow flexion 138 135  Elbow extension -8 0  (Blank rows = not tested)  UPPER EXTREMITY MMT:     MMT Left eval Left 11/29/22  Shoulder flexion  5/5  Shoulder abduction  4+/5  Shoulder adduction  5/5  Shoulder extension  5/5  Shoulder internal  rotation  5/5  Shoulder external rotation  5/5  (Blank rows = not tested)  SENSATION: WFL  EDEMA: None noted this session  OBSERVATIONS: Moderate to severe fascial restrictions noted in the biceps, anterior shoulder girdle, trapezius, and scapular region.   TODAY'S TREATMENT:                                                                                                                              DATE:   11/29/22 -A/ROM: standing, flexion, abduction, protraction, horizontal abduction, er/IR, x12 -Scapular Strengthening: blue band, extension, retraction, protraction, rows, x15 -Shoulder strengthening: blue band, horizontal abduction, er, ir, flexion, abduction, x12 -Body Blade: abduction - vertical and horizontal, flexion - vertical and horizontal, Scaption, x35" -Stretches: er behind the back, IR behind the back, 4x15"  11/27/22 -Manual Therapy: myofascial release and trigger point applied to biceps, deltoid, scapular region, and trapezius in order to reduce pain and fascial restrictions to improve ROM -A/ROM: standing, flexion, abduction, protraction, horizontal abduction, er/IR, x15 -Shoulder Strengthening: 3lb dumbbells, flexion, abduction, protraction, horizontal abduction, er/IR, x10 -X to V arms, x12, 3lb dumbbells -Goal Post arms, x12, 3lb dumbbells -PNF Strengthening: green band, chest pulls, overhead pulls, er pulls, PNF up, PNF down, x12 -loop Band Exercises: green band, wall taps, V ups, Wall Clocks, x12  11/22/22 -A/ROM: standing, 2lb dumbbells- flexion, abduction, protraction, horizontal abduction, er/IR, x15 -X to V arms, x12, 2lb dumbbells -Goal Post arms, x12, 2lb dumbbells -Proximal Shoulder exercises: 2lb dumbbells, paddles, criss cross, circles each direction, x12 -Ball on the wall ABC's -Therapy Ball Exercises: flexion, protraction, overhead presses, V ups, circles both directions, x10 -Overhead lacing -Scapular Strengthening: green band, extension,  retraction, protraction, rows, x12 -Shoulder strengthening: green band, horizontal abduction, er, ir, flexion, abduction, x12 -UBE: Level 4, 5.0 KPH+, 3' forwards and backwards   PATIENT EDUCATION: Education details: Public relations account executive (band) Person educated: Patient Education method: Explanation, Demonstration, and Handouts Education comprehension: verbalized understanding and returned demonstration  HOME EXERCISE PROGRAM: 6/17: Elbow AA/ROM, Wrist and Hand ROM 6/25: Table Slides and Pendulums 6/27: AA/ROM 7/3: A/ROM and Wall slides 7/16: Isometrics 7/18: Shoulder Strengthening with weights 7/26: Functional Reaching 8/1: Cross body, flexion, and er stretches 8/12: Scapular strengthening (green) 8/20: PNF Strengthening Chilton Si) 8/22: Shoulder Strengthening (band)  GOALS: Goals reviewed with patient? Yes  SHORT TERM GOALS: Target date: 10/26/22  Pt will be provided with and educated on  HEP to improve mobility in LUE required for use during ADL completion.   Goal status: MET  2.  Pt will increase LUE P/ROM by 50 degrees to improve ability to use LUE during dressing tasks with minimal compensatory techniques.    Goal status: MET  3.  Pt will increase LUE strength to 3+/5 to improve ability to reach for items at waist to chest height during bathing and grooming tasks.   Goal status: MET   LONG TERM GOALS: Target date: 11/30/22  Pt will decrease pain in LUE to 3/10 or less to improve ability to sleep for 2+ consecutive hours without waking due to pain.   Goal status: MET  2.  Pt will decrease LUE fascial restrictions to min amounts or less to improve mobility required for functional reaching tasks.    Goal status: MET  3.  Pt will increase LUE A/ROM to [facility] to improve ability to use LUE when reaching overhead or behind back during dressing and bathing tasks.  Goal status: MET  4.   Pt will increase LUE strength to 5/5 or greater to improve ability to use LUE when  lifting or carrying items during meal preparation/housework/yardwork tasks.   Goal status: MET  5.  Pt will return to highest level of function using LUE as dominant during functional task completion.  Goal status: MET   ASSESSMENT:  CLINICAL IMPRESSION: Pt was reassessed this session and demonstrated good/WFL ROM and strength. He is able to complete ADL's with no assist at this time and notes mild intermittent pain/soreness. Pt and OT reviewed HEP and pt demonstrated good movement pattern and no difficulties. Pt has met all of his OT goals this session and all questions and concerns are answered.  PERFORMANCE DEFICITS: in functional skills including ADLs, IADLs, ROM, strength, pain, fascial restrictions, Fine motor control, Gross motor control, body mechanics, and UE functional use.   PLAN:  OT FREQUENCY: 2x/week  OT DURATION: 8 weeks  PLANNED INTERVENTIONS: self care/ADL training, therapeutic exercise, therapeutic activity, manual therapy, passive range of motion, functional mobility training, electrical stimulation, ultrasound, moist heat, patient/family education, coping strategies training, and DME and/or AE instructions  CONSULTED AND AGREED WITH PLAN OF CARE: Patient  PLAN FOR NEXT SESSION: Discharge   Trish Mage, OTR/L  [phone] 11/29/2022, 2:46 PM

## 2022-12-24 ENCOUNTER — Encounter: Payer: Self-pay | Admitting: Family Medicine

## 2022-12-25 ENCOUNTER — Other Ambulatory Visit: Payer: Self-pay

## 2022-12-25 ENCOUNTER — Encounter: Payer: Self-pay | Admitting: *Deleted

## 2022-12-25 ENCOUNTER — Telehealth: Payer: Self-pay | Admitting: Family Medicine

## 2022-12-25 MED ORDER — TIRZEPATIDE 2.5 MG/0.5ML ~~LOC~~ SOAJ: 2.5000 mg | SUBCUTANEOUS | 3 refills | Status: DC

## 2022-12-25 NOTE — Telephone Encounter (Signed)
Insurance does not allow for 90 day supply of Mounjaro through mail order and stated it must be a 30 day supply at local pharmacy. Patient notified.

## 2022-12-25 NOTE — Telephone Encounter (Signed)
Nurses I did receive a request for his Mounjaro to be sent to [facility] We tried to do this electronically and it did not go through  Please call this and manually to [facility]

## 2022-12-26 ENCOUNTER — Telehealth: Payer: Self-pay | Admitting: Behavioral Health

## 2022-12-26 DIAGNOSIS — R454 Irritability and anger: Secondary | ICD-10-CM

## 2022-12-26 DIAGNOSIS — F39 Unspecified mood [affective] disorder: Secondary | ICD-10-CM

## 2022-12-26 DIAGNOSIS — F411 Generalized anxiety disorder: Secondary | ICD-10-CM

## 2022-12-26 MED ORDER — TIRZEPATIDE 2.5 MG/0.5ML ~~LOC~~ SOAJ: 2.5000 mg | SUBCUTANEOUS | 3 refills | Status: DC

## 2022-12-26 MED ORDER — LAMOTRIGINE 150 MG PO TABS
150.0000 mg | ORAL_TABLET | Freq: Every day | ORAL | 0 refills | Status: AC
Start: 2022-12-26 — End: ?

## 2022-12-26 NOTE — Telephone Encounter (Signed)
Pt called and said that he needs 4 pills of his lamictal 150 mg to the [facility] on [address] in [place]. This will help him while he waits for his mail order

## 2022-12-26 NOTE — Telephone Encounter (Signed)
Sent!

## 2022-12-28 ENCOUNTER — Encounter: Payer: Self-pay | Admitting: Family Medicine

## 2022-12-28 ENCOUNTER — Other Ambulatory Visit: Payer: Self-pay | Admitting: Family Medicine

## 2022-12-28 MED ORDER — METFORMIN HCL 500 MG PO TABS: 1000.0000 mg | ORAL_TABLET | Freq: Two times a day (BID) | ORAL | 1 refills | Status: DC

## 2023-01-04 LAB — HEPATIC FUNCTION PANEL
ALT: 73 [IU]/L — ABNORMAL HIGH (ref 0–44)
AST: 41 [IU]/L — ABNORMAL HIGH (ref 0–40)
Albumin: 4.7 g/dL (ref 4.1–5.1)
Alkaline Phosphatase: 67 [IU]/L (ref 44–121)
Bilirubin Total: 0.6 mg/dL (ref 0.0–1.2)
Bilirubin, Direct: 0.18 mg/dL (ref 0.00–0.40)
Total Protein: 6.9 g/dL (ref 6.0–8.5)

## 2023-01-04 LAB — BASIC METABOLIC PANEL
BUN/Creatinine Ratio: 14 (ref 9–20)
BUN: 13 mg/dL (ref 6–24)
CO2: 24 mmol/L (ref 20–29)
Calcium: 9.6 mg/dL (ref 8.7–10.2)
Chloride: 99 mmol/L (ref 96–106)
Creatinine, Ser: 0.91 mg/dL (ref 0.76–1.27)
Glucose: 115 mg/dL — ABNORMAL HIGH (ref 70–99)
Potassium: 4.5 mmol/L (ref 3.5–5.2)
Sodium: 140 mmol/L (ref 134–144)
eGFR: 109 mL/min/{1.73_m2} (ref >59)

## 2023-01-04 LAB — LIPID PANEL
Chol/HDL Ratio: 3.9 {ratio} (ref 0.0–5.0)
Cholesterol, Total: 153 mg/dL (ref 100–199)
HDL: 39 mg/dL — ABNORMAL LOW (ref >39)
LDL Chol Calc (NIH): 58 mg/dL (ref 0–99)
Triglycerides: 363 mg/dL — ABNORMAL HIGH (ref 0–149)
VLDL Cholesterol Cal: 56 mg/dL — ABNORMAL HIGH (ref 5–40)

## 2023-01-04 LAB — HEMOGLOBIN A1C
Est. average glucose Bld gHb Est-mCnc: 160 mg/dL
Hgb A1c MFr Bld: 7.2 % — ABNORMAL HIGH (ref 4.8–5.6)

## 2023-01-07 ENCOUNTER — Ambulatory Visit (INDEPENDENT_AMBULATORY_CARE_PROVIDER_SITE_OTHER): Payer: 59 | Admitting: Family Medicine

## 2023-01-07 VITALS — BP 124/86 | HR 63 | Temp 98.4°F | Ht 71.0 in | Wt 236.2 lb

## 2023-01-07 DIAGNOSIS — K760 Fatty (change of) liver, not elsewhere classified: Secondary | ICD-10-CM | POA: Diagnosis not present

## 2023-01-07 DIAGNOSIS — E781 Pure hyperglyceridemia: Secondary | ICD-10-CM | POA: Diagnosis not present

## 2023-01-07 DIAGNOSIS — Z7985 Long-term (current) use of injectable non-insulin antidiabetic drugs: Secondary | ICD-10-CM

## 2023-01-07 DIAGNOSIS — E669 Obesity, unspecified: Secondary | ICD-10-CM

## 2023-01-07 DIAGNOSIS — E785 Hyperlipidemia, unspecified: Secondary | ICD-10-CM

## 2023-01-07 DIAGNOSIS — E1169 Type 2 diabetes mellitus with other specified complication: Secondary | ICD-10-CM | POA: Diagnosis not present

## 2023-01-07 DIAGNOSIS — E119 Type 2 diabetes mellitus without complications: Secondary | ICD-10-CM

## 2023-01-07 DIAGNOSIS — E66811 Obesity, class 1: Secondary | ICD-10-CM

## 2023-01-07 MED ORDER — TIRZEPATIDE 5 MG/0.5ML ~~LOC~~ SOAJ: 5.0000 mg | SUBCUTANEOUS | 2 refills | Status: DC

## 2023-01-07 NOTE — Addendum Note (Signed)
Addended by: Lilyan Punt A on: 01/07/2023 02:30 PM   Modules accepted: Orders

## 2023-01-07 NOTE — Progress Notes (Signed)
   Subjective:    Patient ID: Blake [name], male    DOB: 1980/09/23, 42 y.o.   MRN: [id number]  HPI Pt comes in today for 5 month follow up. Pt has no questions or concerns at this time. Patient relates that he recently started [facility] He is tolerating the dose fairly well with little bit of glycemia in the abdomen but no significant setbacks no vomiting His weight has come down a few pounds from earlier this year He is on week 2 just starting week 3 He is trying to eat relatively healthy but could do better He rarely consumes alcohol She does not smoke any longer He denies any significant setbacks recently Trying to be a healthy person Stays active but could fit in more walking Lab work was reviewed in detail Patient denies smoking patient only occasionally has alcohol  Review of Systems     Objective:   Physical Exam  General-in no acute distress Eyes-no discharge Lungs-respiratory rate normal, CTA CV-no murmurs,RRR Extremities skin warm dry no edema Neuro grossly normal Behavior normal, alert       Assessment & Plan:  1. Hyperlipidemia associated with type 2 diabetes mellitus (HCC) Continue cholesterol medication.  Healthy diet.  Recheck lab work in 6 months. We did discuss as his diabetes improves perhaps we can reduce the metformin perhaps reduce Jardiance 2. Type 2 diabetes mellitus without complication, without long-term current use of insulin (HCC) Will recheck A1c in 3 to 4 months continuously gradually go up on Mounjaro Next dose is 5 mg he will give [place] feedback 3 to 4 weeks into that Healthy diet recommended regular activity recommended Diabetic foot exam today   3. Hypertriglyceridemia Improved but continue medication healthy diet continue Mounjaro  4. Fatty liver Slight increased numbers we will repeat lab work again in 4 months time with follow-up office visit  5. Obesity (BMI 30.0-34.9) Portion control Mounjaro goal to get BMI closer to  27  So the goal is to gradually titrate up on his medicine He will send [place] updates every 3 to 4 weeks We are trying to get his weight down as well as A1c down plus also improved fatty liver.  Side effects were discussed in detail.  Lab work before next visit.

## 2023-01-08 NOTE — Addendum Note (Signed)
Addended byTrinidad Curet on: 01/08/2023 12:05 PM   Modules accepted: Orders

## 2023-01-10 ENCOUNTER — Telehealth: Payer: Self-pay | Admitting: Behavioral Health

## 2023-01-10 DIAGNOSIS — F411 Generalized anxiety disorder: Secondary | ICD-10-CM

## 2023-01-10 DIAGNOSIS — F39 Unspecified mood [affective] disorder: Secondary | ICD-10-CM

## 2023-01-10 DIAGNOSIS — R454 Irritability and anger: Secondary | ICD-10-CM

## 2023-01-10 MED ORDER — LAMOTRIGINE 150 MG PO TABS
150.0000 mg | ORAL_TABLET | Freq: Every day | ORAL | 0 refills | Status: DC
Start: 2023-01-10 — End: 2023-02-10

## 2023-01-10 NOTE — Telephone Encounter (Signed)
Patient called stating he missed a return call. He is requesting a refill on the Lamictal. Fill at [id number] - [place], [place] - [address] SEC [address],  [place] [address] Phone: [phone]  Fax: [phone]   Appointment 02/21/23

## 2023-01-10 NOTE — Telephone Encounter (Signed)
We had previously sent in 4 tablets. Patient said he thought that would be all he would need until his mail order shipment arrived, but he still hasn't gotten it and not sure when it will be shipped. He asked for a 30-day fill to his local pharmacy and it was sent.

## 2023-02-09 ENCOUNTER — Other Ambulatory Visit: Payer: Self-pay | Admitting: Behavioral Health

## 2023-02-09 DIAGNOSIS — F39 Unspecified mood [affective] disorder: Secondary | ICD-10-CM

## 2023-02-09 DIAGNOSIS — F411 Generalized anxiety disorder: Secondary | ICD-10-CM

## 2023-02-09 DIAGNOSIS — R454 Irritability and anger: Secondary | ICD-10-CM

## 2023-02-14 ENCOUNTER — Other Ambulatory Visit: Payer: Self-pay | Admitting: *Deleted

## 2023-02-14 MED ORDER — EMPAGLIFLOZIN 25 MG PO TABS: 25.0000 mg | ORAL_TABLET | Freq: Every day | ORAL | 0 refills | Status: DC

## 2023-02-19 ENCOUNTER — Encounter: Payer: Self-pay | Admitting: Family Medicine

## 2023-02-20 ENCOUNTER — Other Ambulatory Visit: Payer: Self-pay

## 2023-02-20 MED ORDER — TIRZEPATIDE 7.5 MG/0.5ML ~~LOC~~ SOAJ: 7.5000 mg | SUBCUTANEOUS | 3 refills | Status: DC

## 2023-02-20 NOTE — Telephone Encounter (Signed)
+   I would recommend going up on the dose Mounjaro 7.5 mg may send in 4 weeks supply with 3 refills  Patient to do lab work before his wellness visit in December

## 2023-02-21 ENCOUNTER — Encounter: Payer: Self-pay | Admitting: Behavioral Health

## 2023-02-21 ENCOUNTER — Telehealth (INDEPENDENT_AMBULATORY_CARE_PROVIDER_SITE_OTHER): Payer: 59 | Admitting: Behavioral Health

## 2023-02-21 DIAGNOSIS — F411 Generalized anxiety disorder: Secondary | ICD-10-CM | POA: Diagnosis not present

## 2023-02-21 DIAGNOSIS — F39 Unspecified mood [affective] disorder: Secondary | ICD-10-CM

## 2023-02-21 DIAGNOSIS — R454 Irritability and anger: Secondary | ICD-10-CM

## 2023-02-21 MED ORDER — LAMOTRIGINE 100 MG PO TABS
100.0000 mg | ORAL_TABLET | Freq: Every day | ORAL | 1 refills | Status: DC
Start: 2023-02-21 — End: 2023-07-16

## 2023-02-21 NOTE — Progress Notes (Signed)
Blake [name] [id number] [birth year] 42 y.o.  Virtual Visit via Video Note  I connected with pt @ on 02/21/23 at  3:30 PM EST by a video enabled telemedicine application and verified that I am speaking with the correct person using two identifiers.   I discussed the limitations of evaluation and management by telemedicine and the availability of in person appointments. The patient expressed understanding and agreed to proceed.  I discussed the assessment and treatment plan with the patient. The patient was provided an opportunity to ask questions and all were answered. The patient agreed with the plan and demonstrated an understanding of the instructions.   The patient was advised to call back or seek an in-person evaluation if the symptoms worsen or if the condition fails to improve as anticipated.  I provided 30 minutes of non-face-to-face time during this encounter.  The patient was located at home.  The provider was located at [facility].   Joan Flores, NP   Subjective:   Patient ID:  Blake [name] is a 42 y.o. (DOB 01-May-1980) male.  Chief Complaint:  Chief Complaint  Patient presents with   Anxiety   Irritability   Follow-up   Patient Education   Medication Refill   Medication Problem    HPI  42 year old male presents to this office for follow up and medication management. Says moods and irritability have improved significantly but recently has noticed an increase in irritability and mood swings. He is wondering if he is getting tolerance.  He is wanting to discuss increase in his Lamictal this visit.  He is requesting 3  month f/u.  His anxiety today  is 2/10 and depression is 0/10. He sleeps about 7 hours per night now which is also new improvement. He does endorse sleep apnea and wears C-Pap at while sleeping. He denies mania, no psychosis, no SI/HI.   Prior psychiatric medication list:   Wellbutrin Zoloft Prozac Chantix   Review of Systems:   Review of Systems  Constitutional: Negative.   Allergic/Immunologic: Negative.   Psychiatric/Behavioral:  Positive for behavioral problems.     Medications: I have reviewed the patient's current medications.  Current Outpatient Medications  Medication Sig Dispense Refill   lamoTRIgine (LAMICTAL) 100 MG tablet Take 1 tablet (100 mg total) by mouth daily. 180 tablet 1   empagliflozin (JARDIANCE) 25 MG TABS tablet Take 1 tablet (25 mg total) by mouth daily before breakfast. 90 tablet 0   glucose blood test strip Test glucose once a day DX: E11.9 50 each 5   icosapent Ethyl (VASCEPA) 1 g capsule Take 1 capsule (1 g total) by mouth 2 (two) times daily. 60 capsule 5   lamoTRIgine (LAMICTAL) 150 MG tablet TAKE 1 TABLET(150 MG) BY MOUTH DAILY 30 tablet 0   lisinopril (ZESTRIL) 20 MG tablet Take 1 tablet (20 mg total) by mouth daily. 90 tablet 3   metFORMIN (GLUCOPHAGE) 500 MG tablet Take 2 tablets (1,000 mg total) by mouth 2 (two) times daily. 360 tablet 1   rosuvastatin (CRESTOR) 40 MG tablet TAKE 1 TABLET BY MOUTH DAILY 90 tablet 3   tirzepatide (MOUNJARO) 7.5 MG/0.5ML Pen Inject 7.5 mg into the skin once a week. 2 mL 3   No current facility-administered medications for this visit.    Medication Side Effects: None  Allergies:  Allergies  Allergen Reactions   Penicillins Other (See Comments)    Unknown childhood reaction    Past Medical History:  Diagnosis Date  Depression    Elevated ferritin 06/25/2019   High blood cholesterol level    Hypertension    Sleep apnea 01/07/2013   cpap   TIA (transient ischemic attack)    Tobacco abuse 11/19/2018   Patient working very hard at quitting   Type 2 diabetes mellitus (HCC) 11/19/2018    Family History  Problem Relation Age of Onset   Schizophrenia Father    Alcohol abuse Father    Diabetes Father    Alcohol abuse Maternal Grandfather    Alcohol abuse Paternal Uncle    Sleep apnea Neg Hx     Social History   Socioeconomic  History   Marital status: Married    Spouse name: Not on file   Number of children: Not on file   Years of education: Not on file   Highest education level: Associate degree: occupational, Scientist, product/process development, or vocational program  Occupational History   Not on file  Tobacco Use   Smoking status: Former    Current packs/day: 0.00    Average packs/day: 0.5 packs/day for 15.0 years (7.5 ttl pk-yrs)    Types: Cigarettes    Start date: 09/08/2006    Quit date: 09/07/2021    Years since quitting: 1.4   Smokeless tobacco: Never   Tobacco comments:    Started smoking again around April 2020  Vaping Use   Vaping status: Former  Substance and Sexual Activity   Alcohol use: Yes    Alcohol/week: 0.0 standard drinks of alcohol    Comment: occas   Drug use: No   Sexual activity: Yes  Other Topics Concern   Not on file  Social History Narrative   Live with wife and two children in [place]. Like woodworking in free time.      Right handed   Caffeine: 1 cup/day   Social Determinants of Health   Financial Resource Strain: Low Risk  (08/06/2022)   Overall Financial Resource Strain (CARDIA)    Difficulty of Paying Living Expenses: Not hard at all  Food Insecurity: No Food Insecurity (08/06/2022)   Hunger Vital Sign    Worried About Running Out of Food in the Last Year: Never true    Ran Out of Food in the Last Year: Never true  Transportation Needs: No Transportation Needs (08/06/2022)   PRAPARE - Administrator, Civil Service (Medical): No    Lack of Transportation (Non-Medical): No  Physical Activity: Insufficiently Active (08/06/2022)   Exercise Vital Sign    Days of Exercise per Week: 2 days    Minutes of Exercise per Session: 20 min  Stress: No Stress Concern Present (08/06/2022)   [employer] of Occupational Health - Occupational Stress Questionnaire    Feeling of Stress : Only a little  Social Connections: Socially Integrated (08/06/2022)   Social Connection and  Isolation Panel [NHANES]    Frequency of Communication with Friends and Family: More than three times a week    Frequency of Social Gatherings with Friends and Family: Twice a week    Attends Religious Services: More than 4 times per year    Active Member of [employer] or Organizations: Yes    Attends Banker Meetings: More than 4 times per year    Marital Status: Married  Catering manager Violence: Unknown (07/14/2021)   Received from [employer]   HITS    Physically Hurt: Not on file    Insult or Talk Down To: Not on file  Threaten Physical Harm: Not on file    Scream or Curse: Not on file    Past Medical History, Surgical history, Social history, and Family history were reviewed and updated as appropriate.   Please see review of systems for further details on the patient's review from today.   Objective:   Physical Exam:  There were no vitals taken for this visit.  Physical Exam Constitutional:      General: He is not in acute distress.    Appearance: Normal appearance.  Neurological:     Mental Status: He is alert. He is disoriented.     Gait: Gait normal.  Psychiatric:        Attention and Perception: Attention and perception normal. He does not perceive auditory or visual hallucinations.        Mood and Affect: Mood and affect normal. Mood is not anxious or depressed. Affect is not labile.        Speech: Speech normal.        Behavior: Behavior normal. Behavior is cooperative.        Thought Content: Thought content normal.        Cognition and Memory: Cognition and memory normal.        Judgment: Judgment normal.     Lab Review:     Component Value Date/Time   NA 140 01/03/2023 0747   K 4.5 01/03/2023 0747   CL 99 01/03/2023 0747   CO2 24 01/03/2023 0747   GLUCOSE 115 (H) 01/03/2023 0747   GLUCOSE 116 (H) 09/26/2021 0456   BUN 13 01/03/2023 0747   CREATININE 0.91 01/03/2023 0747   CREATININE 1.08 11/23/2013 0847   CALCIUM 9.6  01/03/2023 0747   PROT 6.9 01/03/2023 0747   ALBUMIN 4.7 01/03/2023 0747   AST 41 (H) 01/03/2023 0747   ALT 73 (H) 01/03/2023 0747   ALKPHOS 67 01/03/2023 0747   BILITOT 0.6 01/03/2023 0747   GFRNONAA >60 09/26/2021 0456   GFRAA 126 01/12/2020 0803       Component Value Date/Time   WBC 6.8 09/26/2021 0456   RBC 4.94 09/26/2021 0456   HGB 15.3 09/26/2021 0456   HGB 14.9 07/08/2019 0823   HCT 44.2 09/26/2021 0456   HCT 43.4 07/08/2019 0823   PLT 173 09/26/2021 0456   PLT 214 07/08/2019 0823   MCV 89.5 09/26/2021 0456   MCV 94 07/08/2019 0823   MCH 31.0 09/26/2021 0456   MCHC 34.6 09/26/2021 0456   RDW 14.1 09/26/2021 0456   RDW 13.3 07/08/2019 0823   LYMPHSABS 1.3 08/01/2020 0931   LYMPHSABS 2.0 07/08/2019 0823   MONOABS 0.6 08/01/2020 0931   EOSABS 0.3 08/01/2020 0931   EOSABS 0.4 07/08/2019 0823   BASOSABS 0.0 08/01/2020 0931   BASOSABS 0.1 07/08/2019 0823    No results found for: "POCLITH", "LITHIUM"   No results found for: "PHENYTOIN", "PHENOBARB", "VALPROATE", "CBMZ"   .res Assessment: Plan:     Greater than 50% of 30 min face to face time with patient was spent on counseling and coordination of care. Discussed his slight decline with his moods recently. He is very happy with how Lamictal has helped him but feels like the medication has lost some of its efficacy.  We talked about adjusting his Lamictal  this visit. We agreed to;   Continue Lamictal to 150 mg.  Will report worsening symptoms promptly To follow up in 6 months to  reassess per pt Provided emergency contact information Monitor for any sign of  rash. Please taking Lamictal and contact office immediately rash develops. Recommend seeking urgent medical attention if rash is severe and/or spreading quickly.  Reviewed PDMP      Chiron was seen today for anxiety, irritability, follow-up, patient education, medication refill and medication problem.  Diagnoses and all orders for this  visit:  Irritability -     lamoTRIgine (LAMICTAL) 100 MG tablet; Take 1 tablet (100 mg total) by mouth daily.  Unspecified mood (affective) disorder (HCC) -     lamoTRIgine (LAMICTAL) 100 MG tablet; Take 1 tablet (100 mg total) by mouth daily.  Generalized anxiety disorder     Please see After Visit Summary for patient specific instructions.  Future Appointments  Date Time Provider Department Center  03/18/2023  3:45 PM Ihor Austin, NP [facility] None  03/28/2023  2:30 PM Babs Sciara, MD [facility]  05/15/2023  9:00 AM Babs Sciara, MD [facility]    No orders of the defined types were placed in this encounter.     -------------------------------

## 2023-03-14 NOTE — Progress Notes (Signed)
[facility] [address] [place]. [place] [address] [phone]       OFFICE FOLLOW UP NOTE  Mr. Blake [name] Date of Birth:  [birth year] Medical Record Number:  [id number]     Primary neurologist: Dr. Frances Furbish Reason for visit: CPAP follow-up    SUBJECTIVE:   CHIEF COMPLAINT:  Chief Complaint  Patient presents with   Follow-up    Rm 8. Patient reports doing well on CPAP, states he recently got a new machine and it has been doing well for him    Follow-up visit:  Prior visit: 11/21/2021 Dr. Frances Furbish  Brief HPI:   Blake [name] is a 42 y.o. male who is followed in office for OSA on CPAP.  HST 07/2021 showed AHI of 6.5/h and O2 nadir of 85%.  AutoPap set up 08/16/2021.  At prior visit, noted good compliance and optimal residual AHI.  Reported tolerating well.  Continue current pressure settings.     Interval history:  Returns today for CPAP follow-up visit.  Compliance report as below showing excellent usage and optimal residual AHI. Continues to tolerate well overall but can have mask sliding up face at times which can be bothersome. He questions if mask fitting correctly. He generally does not sleep for long duration which causes daytime fatigue.  Typically sleeps for 5 to 6 hours per night.   Of note, blood pressure elevated today which is not normal for him. He does note some mild pressure behind his eyes bilaterally and some increased fatigue the past couple of days, denies vision changes or any other associated symptoms. Did have adjustments to lamotrigine a few weeks ago but no other medication changes.         ROS:   14 system review of systems performed and negative with exception of those listed in HPI  PMH:  Past Medical History:  Diagnosis Date   Depression    Elevated ferritin 06/25/2019   High blood cholesterol level    Hypertension    Sleep apnea 01/07/2013   cpap   TIA (transient ischemic attack)    Tobacco abuse  11/19/2018   Patient working very hard at quitting   Type 2 diabetes mellitus (HCC) 11/19/2018    PSH:  Past Surgical History:  Procedure Laterality Date   ABDOMINAL SURGERY     APPENDECTOMY     CHOLECYSTECTOMY      Social History:  Social History   Socioeconomic History   Marital status: Married    Spouse name: Not on file   Number of children: Not on file   Years of education: Not on file   Highest education level: Associate degree: occupational, Scientist, product/process development, or vocational program  Occupational History   Not on file  Tobacco Use   Smoking status: Former    Current packs/day: 0.00    Average packs/day: 0.5 packs/day for 15.0 years (7.5 ttl pk-yrs)    Types: Cigarettes    Start date: 09/08/2006    Quit date: 09/07/2021    Years since quitting: 1.5   Smokeless tobacco: Never   Tobacco comments:    Started smoking again around April 2020  Vaping Use   Vaping status: Former  Substance and Sexual Activity   Alcohol use: Yes    Alcohol/week: 0.0 standard drinks of alcohol    Comment: occas   Drug use: No   Sexual activity: Yes  Other Topics Concern   Not on file  Social History Narrative   Live with wife and  two children in [place]. Like woodworking in free time.      Right handed   Caffeine: 1 cup/day   Social Determinants of Health   Financial Resource Strain: Low Risk  (08/06/2022)   Overall Financial Resource Strain (CARDIA)    Difficulty of Paying Living Expenses: Not hard at all  Food Insecurity: No Food Insecurity (08/06/2022)   Hunger Vital Sign    Worried About Running Out of Food in the Last Year: Never true    Ran Out of Food in the Last Year: Never true  Transportation Needs: No Transportation Needs (08/06/2022)   PRAPARE - Administrator, Civil Service (Medical): No    Lack of Transportation (Non-Medical): No  Physical Activity: Insufficiently Active (08/06/2022)   Exercise Vital Sign    Days of Exercise per Week: 2 days    Minutes of  Exercise per Session: 20 min  Stress: No Stress Concern Present (08/06/2022)   [employer] of Occupational Health - Occupational Stress Questionnaire    Feeling of Stress : Only a little  Social Connections: Socially Integrated (08/06/2022)   Social Connection and Isolation Panel [NHANES]    Frequency of Communication with Friends and Family: More than three times a week    Frequency of Social Gatherings with Friends and Family: Twice a week    Attends Religious Services: More than 4 times per year    Active Member of [employer] or Organizations: Yes    Attends Engineer, structural: More than 4 times per year    Marital Status: Married  Catering manager Violence: Unknown (07/14/2021)   Received from [employer], [facility]   HITS    Physically Hurt: Not on file    Insult or Talk Down To: Not on file    Threaten Physical Harm: Not on file    Scream or Curse: Not on file    Family History:  Family History  Problem Relation Age of Onset   Schizophrenia Father    Alcohol abuse Father    Diabetes Father    Alcohol abuse Maternal Grandfather    Alcohol abuse Paternal Uncle    Sleep apnea Neg Hx     Medications:   Current Outpatient Medications on File Prior to Visit  Medication Sig Dispense Refill   empagliflozin (JARDIANCE) 25 MG TABS tablet Take 1 tablet (25 mg total) by mouth daily before breakfast. 90 tablet 0   glucose blood test strip Test glucose once a day DX: E11.9 50 each 5   icosapent Ethyl (VASCEPA) 1 g capsule Take 1 capsule (1 g total) by mouth 2 (two) times daily. 60 capsule 5   lamoTRIgine (LAMICTAL) 100 MG tablet Take 1 tablet (100 mg total) by mouth daily. 180 tablet 1   lisinopril (ZESTRIL) 20 MG tablet Take 1 tablet (20 mg total) by mouth daily. 90 tablet 3   metFORMIN (GLUCOPHAGE) 500 MG tablet Take 2 tablets (1,000 mg total) by mouth 2 (two) times daily. 360 tablet 1   rosuvastatin (CRESTOR) 40 MG tablet TAKE 1 TABLET BY MOUTH DAILY 90 tablet 3    tirzepatide (MOUNJARO) 7.5 MG/0.5ML Pen Inject 7.5 mg into the skin once a week. 2 mL 3   lamoTRIgine (LAMICTAL) 150 MG tablet TAKE 1 TABLET(150 MG) BY MOUTH DAILY 30 tablet 0   No current facility-administered medications on file prior to visit.    Allergies:   Allergies  Allergen Reactions   Penicillins Other (See Comments)    Unknown childhood  reaction      OBJECTIVE:  Physical Exam  Vitals:   03/18/23 1537 03/18/23 1541 03/18/23 1554  BP: (!) 154/92 (!) 155/97 (!) 150/90  Pulse: 72 69   Weight: 231 lb 3.2 oz (104.9 kg)    Height: 5\' 11"  (1.803 m)     Body mass index is 32.25 kg/m. No results found.  General: well developed, well nourished, seated, in no evident distress  Neurologic Exam Mental Status: Awake and fully alert. Oriented to place and time. Recent and remote memory intact. Attention span, concentration and fund of knowledge appropriate. Mood and affect appropriate.  Cranial Nerves: Visual fields full to confrontation. Hearing intact. Face, tongue, palate moves normally and symmetrically.  Motor: Normal bulk and tone. Normal strength in all tested extremity muscles Gait and Station: Arises from chair without difficulty. Stance is normal. Gait demonstrates normal stride length and balance without use of AD.        ASSESSMENT/PLAN: Blake [name] is a 42 y.o. year old male    OSA on CPAP : Compliance report shows satisfactory usage with optimal residual AHI.  Continue current pressure settings. C/o mask sliding up face - suspect d/t facial hair but will  request mask refitting through DME.  Discussed continued nightly usage with ensuring greater than 4 hours nightly for optimal benefit and per insurance purposes.  Continue to follow with DME company for any needed supplies or CPAP related concerns  Elevated blood pressure: associated with mild headache and fatigue over the past couple of days. He was encouraged to monitor at home and to follow up with  PCP if remains elevated. Discuss symptoms that should prompt ED evaluation, he verbalized understanding     Follow up in 1 year via MyChart VV or call earlier if needed   CC:  PCP: Babs Sciara, MD    I spent 30 minutes of face-to-face and non-face-to-face time with patient.  This included previsit chart review, lab review, study review, order entry, electronic health record documentation, patient education and discussion regarding above diagnoses and treatment plan and answered all other questions to patient's satisfaction  Ihor Austin, [facility]  [facility] [address] [place], [place] [address]  Phone [phone] Fax [phone] Note: This document was prepared with digital dictation and possible smart phrase technology. Any transcriptional errors that result from this process are unintentional.

## 2023-03-18 ENCOUNTER — Encounter: Payer: Self-pay | Admitting: Adult Health

## 2023-03-18 ENCOUNTER — Ambulatory Visit: Payer: 59 | Admitting: Adult Health

## 2023-03-18 VITALS — BP 150/90 | HR 69 | Ht 71.0 in | Wt 231.2 lb

## 2023-03-18 DIAGNOSIS — G4733 Obstructive sleep apnea (adult) (pediatric): Secondary | ICD-10-CM | POA: Diagnosis not present

## 2023-03-18 NOTE — Patient Instructions (Addendum)
Continue nightly use of CPAP for adequate sleep apnea management  Will request mask refitting with DME - you will be called to set this up  Monitor blood pressure at home, if remains elevated, please follow up with PCP. If you should experience any severe headache, vision changes or other strokelike symptoms (weakness, numbness, speech difficulty), or nausea vomiting, please call 911 immediately for more emergent evaluation     Follow-up in 1 year or call earlier if needed

## 2023-03-28 ENCOUNTER — Ambulatory Visit (INDEPENDENT_AMBULATORY_CARE_PROVIDER_SITE_OTHER): Payer: 59 | Admitting: Family Medicine

## 2023-03-28 ENCOUNTER — Encounter: Payer: Self-pay | Admitting: Family Medicine

## 2023-03-28 VITALS — BP 142/86 | HR 68 | Temp 98.3°F | Ht 71.0 in | Wt 230.0 lb

## 2023-03-28 DIAGNOSIS — E785 Hyperlipidemia, unspecified: Secondary | ICD-10-CM

## 2023-03-28 DIAGNOSIS — E1169 Type 2 diabetes mellitus with other specified complication: Secondary | ICD-10-CM

## 2023-03-28 DIAGNOSIS — E66811 Obesity, class 1: Secondary | ICD-10-CM

## 2023-03-28 DIAGNOSIS — Z0001 Encounter for general adult medical examination with abnormal findings: Secondary | ICD-10-CM | POA: Diagnosis not present

## 2023-03-28 DIAGNOSIS — Z0000 Encounter for general adult medical examination without abnormal findings: Secondary | ICD-10-CM

## 2023-03-28 DIAGNOSIS — Z7985 Long-term (current) use of injectable non-insulin antidiabetic drugs: Secondary | ICD-10-CM | POA: Diagnosis not present

## 2023-03-28 DIAGNOSIS — E119 Type 2 diabetes mellitus without complications: Secondary | ICD-10-CM

## 2023-03-28 DIAGNOSIS — R7989 Other specified abnormal findings of blood chemistry: Secondary | ICD-10-CM

## 2023-03-28 DIAGNOSIS — K760 Fatty (change of) liver, not elsewhere classified: Secondary | ICD-10-CM | POA: Diagnosis not present

## 2023-03-28 DIAGNOSIS — Z79899 Other long term (current) drug therapy: Secondary | ICD-10-CM

## 2023-03-28 MED ORDER — TIRZEPATIDE 10 MG/0.5ML ~~LOC~~ SOAJ: 10.0000 mg | SUBCUTANEOUS | 3 refills | Status: DC

## 2023-03-28 NOTE — Progress Notes (Signed)
   Subjective:    Patient ID: Blake [name], male    DOB: 04/19/1980, 42 y.o.   MRN: [id number]  HPI The patient comes in today for a wellness visit. Patient not smoking not drinking trying to stay active Blood pressure up today the patient states he has been stressed because making some financial decisions    A review of their health history was completed.  A review of medications was also completed.  Any needed refills; No  Eating habits: Good  Falls/  MVA accidents in past few months: No  Regular exercise: Yes  Specialist pt sees on regular basis: no  Preventative health issues were discussed.   Additional concerns: None at this time Blood work ordered in detail  Review of Systems     Objective:   Physical Exam General-in no acute distress Eyes-no discharge Lungs-respiratory rate normal, CTA CV-no murmurs,RRR Extremities skin warm dry no edema Neuro grossly normal Behavior normal, alert  Rectal exam not indicated      Assessment & Plan:  1. Well adult exam (Primary) Adult wellness-complete.wellness physical was conducted today. Importance of diet and exercise were discussed in detail.  Importance of stress reduction and healthy living were discussed.  In addition to this a discussion regarding safety was also covered.  We also reviewed over immunizations and gave recommendations regarding current immunization needed for age.   In addition to this additional areas were also touched on including: Preventative health exams needed:  Colonoscopy not indicated currently age 85  Patient was advised yearly wellness exam   2. Type 2 diabetes mellitus without complication, without long-term current use of insulin (HCC) Increase Mounjaro doing a good job with diet losing some weight increase to 10 mg check lab work by January early February - Hemoglobin A1c - Basic Metabolic Panel - Microalbumin/Creatinine Ratio, Urine  3. Hyperlipidemia associated with  type 2 diabetes mellitus (HCC) Continue statin lab work by January early February - Lipid Panel  4. Fatty liver Avoid alcohol continue GLP-1 continue healthy diet trying to lose weight - Hepatic Function Panel  5. Obesity (BMI 30.0-34.9) Portion control regular physical activity continue medication try lose weight  6. Elevated ferritin Follow-up labs in January or early February - Ferritin  7. High risk medication use Follow-up lab - CBC with Differential

## 2023-03-29 ENCOUNTER — Encounter: Payer: Self-pay | Admitting: Family Medicine

## 2023-04-01 ENCOUNTER — Other Ambulatory Visit: Payer: Self-pay | Admitting: Family Medicine

## 2023-04-01 ENCOUNTER — Other Ambulatory Visit: Payer: Self-pay

## 2023-04-01 DIAGNOSIS — E119 Type 2 diabetes mellitus without complications: Secondary | ICD-10-CM

## 2023-04-01 MED ORDER — TIRZEPATIDE 7.5 MG/0.5ML ~~LOC~~ SOAJ
7.5000 mg | SUBCUTANEOUS | 3 refills | Status: DC
Start: 2023-04-01 — End: 2023-05-15

## 2023-04-01 NOTE — Telephone Encounter (Signed)
Nurses I removed Mounjaro 10 mg from his list He may utilize Mounjaro 7.5 mg once weekly 1 month supply with 3 refills After utilizing 7.5 for several weeks if he is feeling that he is doing well with this and would like to go up he can buy notifying us otherwise stay with 7.5 over the next few months Communicate with patient send 2 whichever pharmacy he prefers to utilize thank you  Have Brett Canales send [place] a message in several weeks time how the 7.5 is doing for him  Thanks-Dr. Lorin Picket

## 2023-04-03 ENCOUNTER — Other Ambulatory Visit: Payer: Self-pay | Admitting: Behavioral Health

## 2023-04-03 DIAGNOSIS — F39 Unspecified mood [affective] disorder: Secondary | ICD-10-CM

## 2023-04-03 DIAGNOSIS — R454 Irritability and anger: Secondary | ICD-10-CM

## 2023-04-03 DIAGNOSIS — F411 Generalized anxiety disorder: Secondary | ICD-10-CM

## 2023-04-16 ENCOUNTER — Telehealth: Payer: Self-pay | Admitting: Family Medicine

## 2023-04-16 NOTE — Telephone Encounter (Signed)
 I have sent this issue to the nurses via telephone message today who work with prior approvals to get this approved for 7.5 mg once weekly  Unfortunately insurance companies have made GLP-1 medications a prime focus for cost cutting and prior approval hoops  Our staff will work with this please keep patient informed of this progress thank you

## 2023-04-16 NOTE — Telephone Encounter (Signed)
 Autumn/nurses Please work on this issue Please clarify with patient It appears that he should be on 7.5 mg of Mounjaro  I am perfectly fine with him staying on this per my previous MyChart message Please handle this through this telephone message then inform the patient that this is been handled (I 100% understand that GLP-1 issues can be difficult to handle I appreciate you all handling this) See MyChart message from below Please keep Garnette informed  Thanks-Dr. Glendia MyChart message as follows from earlier in December Nurses I removed Mounjaro  10 mg from his list He may utilize Mounjaro  7.5 mg once weekly 1 month supply with 3 refills After utilizing 7.5 for several weeks if he is feeling that he is doing well with this and would like to go up he can buy notifying us  otherwise stay with 7.5 over the next few months Communicate with patient send 2 whichever pharmacy he prefers to utilize thank you   Have Marcey send us  a message in several weeks time how the 7.5 is doing for him   Thanks-Dr. Glendia

## 2023-04-16 NOTE — Telephone Encounter (Signed)
 See my chart message

## 2023-04-16 NOTE — Telephone Encounter (Signed)
 Spoke with [facility] - the only reason he could not get the 7.5 mg dosing is because the 10 mg dosing had already been run through insurance for pick up - they stated patient never stated he did not want the med and it was awaiting pick up. They cancelled the 10mg  and ran for the 7.5 mg and it went through fine- they will have to order the med and will contact patient when ready for pick up

## 2023-05-09 ENCOUNTER — Ambulatory Visit: Payer: 59 | Admitting: Family Medicine

## 2023-05-14 ENCOUNTER — Encounter: Payer: Self-pay | Admitting: Family Medicine

## 2023-05-14 LAB — CBC WITH DIFFERENTIAL/PLATELET
Basophils Absolute: 0 10*3/uL (ref 0.0–0.2)
Basos: 1 %
EOS (ABSOLUTE): 0.6 10*3/uL — ABNORMAL HIGH (ref 0.0–0.4)
Eos: 8 %
Hematocrit: 48.6 % (ref 37.5–51.0)
Hemoglobin: 16.7 g/dL (ref 13.0–17.7)
Immature Grans (Abs): 0 10*3/uL (ref 0.0–0.1)
Immature Granulocytes: 0 %
Lymphocytes Absolute: 1.8 10*3/uL (ref 0.7–3.1)
Lymphs: 25 %
MCH: 31.7 pg (ref 26.6–33.0)
MCHC: 34.4 g/dL (ref 31.5–35.7)
MCV: 92 fL (ref 79–97)
Monocytes Absolute: 0.5 10*3/uL (ref 0.1–0.9)
Monocytes: 7 %
Neutrophils Absolute: 4.4 10*3/uL (ref 1.4–7.0)
Neutrophils: 59 %
Platelets: 236 10*3/uL (ref 150–450)
RBC: 5.27 x10E6/uL (ref 4.14–5.80)
RDW: 12.9 % (ref 11.6–15.4)
WBC: 7.3 10*3/uL (ref 3.4–10.8)

## 2023-05-14 LAB — LIPID PANEL
Chol/HDL Ratio: 3.1 {ratio} (ref 0.0–5.0)
Cholesterol, Total: 114 mg/dL (ref 100–199)
HDL: 37 mg/dL — ABNORMAL LOW (ref >39)
LDL Chol Calc (NIH): 46 mg/dL (ref 0–99)
Triglycerides: 187 mg/dL — ABNORMAL HIGH (ref 0–149)
VLDL Cholesterol Cal: 31 mg/dL (ref 5–40)

## 2023-05-14 LAB — HEPATIC FUNCTION PANEL
ALT: 27 [IU]/L (ref 0–44)
AST: 21 [IU]/L (ref 0–40)
Albumin: 4.8 g/dL (ref 4.1–5.1)
Alkaline Phosphatase: 62 [IU]/L (ref 44–121)
Bilirubin Total: 0.4 mg/dL (ref 0.0–1.2)
Bilirubin, Direct: 0.14 mg/dL (ref 0.00–0.40)
Total Protein: 7.3 g/dL (ref 6.0–8.5)

## 2023-05-14 LAB — BASIC METABOLIC PANEL
BUN/Creatinine Ratio: 7 — ABNORMAL LOW (ref 9–20)
BUN: 7 mg/dL (ref 6–24)
CO2: 24 mmol/L (ref 20–29)
Calcium: 9.7 mg/dL (ref 8.7–10.2)
Chloride: 101 mmol/L (ref 96–106)
Creatinine, Ser: 0.94 mg/dL (ref 0.76–1.27)
Glucose: 85 mg/dL (ref 70–99)
Potassium: 4.7 mmol/L (ref 3.5–5.2)
Sodium: 140 mmol/L (ref 134–144)
eGFR: 105 mL/min/{1.73_m2} (ref >59)

## 2023-05-14 LAB — FERRITIN: Ferritin: 127 ng/mL (ref 30–400)

## 2023-05-14 LAB — HEMOGLOBIN A1C
Est. average glucose Bld gHb Est-mCnc: 114 mg/dL
Hgb A1c MFr Bld: 5.6 % (ref 4.8–5.6)

## 2023-05-14 LAB — MICROALBUMIN / CREATININE URINE RATIO
Creatinine, Urine: 113.7 mg/dL
Microalb/Creat Ratio: 16 mg/g{creat} (ref 0–29)
Microalbumin, Urine: 18.2 ug/mL

## 2023-05-15 ENCOUNTER — Ambulatory Visit: Payer: 59 | Admitting: Family Medicine

## 2023-05-15 VITALS — BP 118/82 | Ht 71.0 in | Wt 222.0 lb

## 2023-05-15 DIAGNOSIS — Z7984 Long term (current) use of oral hypoglycemic drugs: Secondary | ICD-10-CM

## 2023-05-15 DIAGNOSIS — I10 Essential (primary) hypertension: Secondary | ICD-10-CM

## 2023-05-15 DIAGNOSIS — E119 Type 2 diabetes mellitus without complications: Secondary | ICD-10-CM

## 2023-05-15 DIAGNOSIS — Z720 Tobacco use: Secondary | ICD-10-CM

## 2023-05-15 DIAGNOSIS — E1169 Type 2 diabetes mellitus with other specified complication: Secondary | ICD-10-CM

## 2023-05-15 MED ORDER — VARENICLINE TARTRATE 0.5 MG PO TABS
0.5000 mg | ORAL_TABLET | Freq: Two times a day (BID) | ORAL | 0 refills | Status: DC
Start: 2023-05-15 — End: 2023-11-05

## 2023-05-15 MED ORDER — VARENICLINE TARTRATE (STARTER) 0.5 MG X 11 & 1 MG X 42 PO TBPK: ORAL_TABLET | ORAL | 0 refills | Status: DC

## 2023-05-15 MED ORDER — ICOSAPENT ETHYL 1 G PO CAPS: 1.0000 g | ORAL_CAPSULE | Freq: Two times a day (BID) | ORAL | 5 refills | Status: DC

## 2023-05-15 MED ORDER — LISINOPRIL 20 MG PO TABS: 20.0000 mg | ORAL_TABLET | Freq: Every day | ORAL | 3 refills | Status: DC

## 2023-05-15 MED ORDER — METFORMIN HCL 500 MG PO TABS: ORAL_TABLET | ORAL | 1 refills | Status: DC

## 2023-05-15 MED ORDER — TIRZEPATIDE 7.5 MG/0.5ML ~~LOC~~ SOAJ
7.5000 mg | SUBCUTANEOUS | 3 refills | Status: DC
Start: 2023-05-15 — End: 2023-11-05

## 2023-05-15 MED ORDER — ROSUVASTATIN CALCIUM 40 MG PO TABS: 40.0000 mg | ORAL_TABLET | Freq: Every day | ORAL | 3 refills | Status: DC

## 2023-05-15 NOTE — Progress Notes (Signed)
   Subjective:    Patient ID: Blake [name], male    DOB: [birth year], 43 y.o.   MRN: [id number]  HPI Patient is doing an excellent job with dietary measures being mindful of his eating and activity states he could do better with food selection Recently got back into the habit of smoking has plans to get away from this His A1c was well out of control even on metformin  and Jardiance  since starting on Mounjaro  his A1c has come down dramatically He is tolerating the medication but does have side effects of bloating nausea and occasional belching issues Does not feel he can go up on the dose currently His weight has come down some Still overweight but is making improvement Purpose of the Mounjaro  is to get his diabetes under better control Blood pressure has been under good control Taking his statins tolerating them well.   Review of Systems     Objective:   Physical Exam General-in no acute distress Eyes-no discharge Lungs-respiratory rate normal, CTA CV-no murmurs,RRR Extremities skin warm dry no edema Neuro grossly normal Behavior normal, alert  Moods are doing well      Assessment & Plan:   1. Essential hypertension (Primary) Blood pressure very good control patient was told that if his blood pressure does keep coming down we can reduce the lisinopril  from 20 mg down to 10 mg or perhaps 5 mg  2. Type 2 diabetes mellitus without complication, without long-term current use of insulin  (HCC) A1c looks much improved reduce the metformin  to 1 tablet twice a day stop Jardiance  because his urine micro protein now looks normal Mounjaro  very important to keep his diabetes under control continue this at the current dosing - tirzepatide  (MOUNJARO ) 7.5 MG/0.5ML Pen; Inject 7.5 mg into the skin once a week.  Dispense: 2 mL; Refill: 3  3. Tobacco abuse Chantix  recommended.  Will send in starter pack and maintenance pack - varenicline  (CHANTIX ) 0.5 MG tablet; Take 1 tablet (0.5 mg  total) by mouth 2 (two) times daily.  Dispense: 60 tablet; Refill: 0  Mild obesity patient to watch portions healthy eating Hyperlipidemia under much better control continue current measures for now perhaps Vascepa  can be reduced on future visits  Follow-up here in approximately 6 months lab work before that visit

## 2023-05-27 ENCOUNTER — Encounter: Payer: Self-pay | Admitting: Family Medicine

## 2023-05-27 ENCOUNTER — Other Ambulatory Visit: Payer: Self-pay

## 2023-05-27 DIAGNOSIS — E119 Type 2 diabetes mellitus without complications: Secondary | ICD-10-CM

## 2023-05-27 DIAGNOSIS — E1169 Type 2 diabetes mellitus with other specified complication: Secondary | ICD-10-CM

## 2023-05-27 DIAGNOSIS — Z79899 Other long term (current) drug therapy: Secondary | ICD-10-CM

## 2023-06-17 ENCOUNTER — Other Ambulatory Visit: Payer: Self-pay | Admitting: Family Medicine

## 2023-07-16 ENCOUNTER — Encounter: Payer: Self-pay | Admitting: Behavioral Health

## 2023-07-16 ENCOUNTER — Telehealth (INDEPENDENT_AMBULATORY_CARE_PROVIDER_SITE_OTHER): Admitting: Behavioral Health

## 2023-07-16 DIAGNOSIS — F39 Unspecified mood [affective] disorder: Secondary | ICD-10-CM

## 2023-07-16 DIAGNOSIS — R454 Irritability and anger: Secondary | ICD-10-CM | POA: Diagnosis not present

## 2023-07-16 MED ORDER — LAMOTRIGINE 100 MG PO TABS
ORAL_TABLET | ORAL | 1 refills | Status: DC
Start: 2023-07-16 — End: 2024-01-15

## 2023-07-16 NOTE — Progress Notes (Addendum)
 Blake [name] [id number] 1980/10/02 43 y.o.  Virtual Visit via Video Note   I connected with pt @ on 07/16/23 at  11:30 AM EST by a video enabled telemedicine application and verified that I am speaking with the correct person using two identifiers.   I discussed the limitations of evaluation and management by telemedicine and the availability of in person appointments. The patient expressed understanding and agreed to proceed.   I discussed the assessment and treatment plan with the patient. The patient was provided an opportunity to ask questions and all were answered. The patient agreed with the plan and demonstrated an understanding of the instructions.   The patient was advised to call back or seek an in-person evaluation if the symptoms worsen or if the condition fails to improve as anticipated.   I provided 30 minutes of non-face-to-face time during this encounter.  The patient was located at home.  The provider was located at [facility].     [name], NP    Subjective:   Patient ID:  Blake [name] is a 43 y.o. (DOB May 08, 1980) male.  Chief Complaint:  Chief Complaint  Patient presents with   Irritability   Patient Education   Follow-up   Medication Refill   Medication Problem    HPI 43  year old male presents to this office via video visit for follow up and medication management. Doing very well with the Lamictal . Medication is working well. Requesting no dosage changes or adjustments this visit.   He is requesting 6  month f/u.  His anxiety today  is 2/10 and depression is 0/10. He sleeps about 7 hours per night now which is also new improvement. He does endorse sleep apnea and wears C-Pap at while sleeping. He denies mania, no psychosis, no SI/HI.   Prior psychiatric medication list:   Wellbutrin Zoloft  Prozac  Chantix         AUDIT    Flowsheet Row Office Visit from 05/15/2023 in [facility]  Alcohol Use  Disorder Identification Test Final Score (AUDIT) 6       CAGE-AID    Flowsheet Row Office Visit from 04/19/2021 in [facility]  CAGE-AID Score 2      GAD-7    Flowsheet Row Office Visit from 03/28/2023 in [facility] Office Visit from 01/07/2023 in [facility] Office Visit from 08/07/2022 in [facility] Office Visit from 05/07/2022 in [facility] Office Visit from 03/26/2022 in [facility]  Total GAD-7 Score 4 1 1 1 5       PHQ2-9    Flowsheet Row Office Visit from 05/15/2023 in [facility] Office Visit from 03/28/2023 in [facility] Office Visit from 01/07/2023 in [facility] Office Visit from 08/07/2022 in [facility] Office Visit from 05/07/2022 in [facility]  PHQ-2 Total Score 0 0 0 0 0  PHQ-9 Total Score 3 2 1 3 5       Flowsheet Row ED from 09/22/2022 in [facility] ED from 03/28/2022 in [facility] ED to Hosp-Admission (Discharged) from 09/25/2021 in [facility] MEDICAL SURGICAL UNIT  C-SSRS RISK CATEGORY No Risk No Risk No Risk        Review of Systems:  Review of Systems  Constitutional: Negative.  Allergic/Immunologic: Negative.   Psychiatric/Behavioral: Negative.      Medications: I have reviewed the patient's current medications.  Current Outpatient Medications  Medication Sig Dispense Refill   glucose blood test strip Test glucose once a day DX: E11.9 50 each 5   icosapent  Ethyl (VASCEPA ) 1 g capsule Take 1 capsule (1 g total) by mouth 2 (two) times daily. 60 capsule 5   lamoTRIgine  (LAMICTAL ) 100 MG tablet Take one tablet by mouth two times daily (200 mg total) 180 tablet 1   lamoTRIgine  (LAMICTAL ) 150 MG tablet TAKE 1  TABLET(150 MG) BY MOUTH DAILY 30 tablet 1   lisinopril  (ZESTRIL ) 20 MG tablet TAKE 1 TABLET BY MOUTH     DAILY 90 tablet 1   metFORMIN  (GLUCOPHAGE ) 500 MG tablet 1 bid 180 tablet 1   rosuvastatin  (CRESTOR ) 40 MG tablet Take 1 tablet (40 mg total) by mouth daily. 90 tablet 3   tirzepatide  (MOUNJARO ) 7.5 MG/0.5ML Pen Inject 7.5 mg into the skin once a week. 2 mL 3   varenicline  (CHANTIX ) 0.5 MG tablet Take 1 tablet (0.5 mg total) by mouth 2 (two) times daily. 60 tablet 0   Varenicline  Tartrate, Starter, (CHANTIX  STARTING MONTH PAK) 0.5 MG X 11 & 1 MG X 42 TBPK Chantix  starter pack per manufacturer instructions 1 each 0   No current facility-administered medications for this visit.    Medication Side Effects: None  Allergies:  Allergies  Allergen Reactions   Penicillins Other (See Comments)    Unknown childhood reaction    Past Medical History:  Diagnosis Date   Depression    Elevated ferritin 06/25/2019   High blood cholesterol level    Hypertension    Sleep apnea 01/07/2013   cpap   TIA (transient ischemic attack)    Tobacco abuse 11/19/2018   Patient working very hard at quitting   Type 2 diabetes mellitus (HCC) 11/19/2018    Past Medical History, Surgical history, Social history, and Family history were reviewed and updated as appropriate.   Please see review of systems for further details on the patient's review from today.   Objective:   Physical Exam:  There were no vitals taken for this visit.  Physical Exam Constitutional:      General: He is not in acute distress.    Appearance: Normal appearance.  Neurological:     Mental Status: He is alert and oriented to person, place, and time.     Gait: Gait normal.  Psychiatric:        Attention and Perception: Attention and perception normal. He does not perceive auditory or visual hallucinations.        Mood and Affect: Mood and affect normal. Mood is not anxious or depressed. Affect is not labile.        Speech:  Speech normal.        Behavior: Behavior normal. Behavior is cooperative.        Thought Content: Thought content normal.        Cognition and Memory: Cognition and memory normal.        Judgment: Judgment normal.     Lab Review:     Component Value Date/Time   NA 140 05/13/2023 0748   K 4.7 05/13/2023 0748   CL 101 05/13/2023 0748   CO2 24 05/13/2023 0748   GLUCOSE 85 05/13/2023 0748   GLUCOSE 116 (H) 09/26/2021 0456   BUN 7 05/13/2023 0748   CREATININE 0.94 05/13/2023 0748   CREATININE 1.08 11/23/2013 0847  CALCIUM  9.7 05/13/2023 0748   PROT 7.3 05/13/2023 0748   ALBUMIN 4.8 05/13/2023 0748   AST 21 05/13/2023 0748   ALT 27 05/13/2023 0748   ALKPHOS 62 05/13/2023 0748   BILITOT 0.4 05/13/2023 0748   GFRNONAA >60 09/26/2021 0456   GFRAA 126 01/12/2020 0803       Component Value Date/Time   WBC 7.3 05/13/2023 0748   WBC 6.8 09/26/2021 0456   RBC 5.27 05/13/2023 0748   RBC 4.94 09/26/2021 0456   HGB 16.7 05/13/2023 0748   HCT 48.6 05/13/2023 0748   PLT 236 05/13/2023 0748   MCV 92 05/13/2023 0748   MCH 31.7 05/13/2023 0748   MCH 31.0 09/26/2021 0456   MCHC 34.4 05/13/2023 0748   MCHC 34.6 09/26/2021 0456   RDW 12.9 05/13/2023 0748   LYMPHSABS 1.8 05/13/2023 0748   MONOABS 0.6 08/01/2020 0931   EOSABS 0.6 (H) 05/13/2023 0748   BASOSABS 0.0 05/13/2023 0748    No results found for: "POCLITH", "LITHIUM"   No results found for: "PHENYTOIN", "PHENOBARB", "VALPROATE", "CBMZ"   .res Assessment: Plan:    Greater than 50% of 30 min via video visit with patient was spent on counseling and coordination of care. Talked about his continued great improvement with irritability and moods. He had been taking the medication incorrectly and taking Lamictal  200 mg instead of 150 mg as directed. He has had no undesirable side effects and has been feeling well. He will continue with the higher dose.   We talked about adjusting his Lamictal   this visit. We agreed to;   Continue  Lamictal  200 mg daily divided between two doses.  Will report worsening symptoms promptly To follow up in 6 months to  reassess per pt Provided emergency contact information Monitor for any sign of rash. Please taking Lamictal  and contact office immediately rash develops. Recommend seeking urgent medical attention if rash is severe and/or spreading quickly.  Reviewed PDMP       Blake [name] was seen today for irritability, patient education, follow-up, medication refill and medication problem.  Diagnoses and all orders for this visit:  Irritability -     lamoTRIgine  (LAMICTAL ) 100 MG tablet; Take one tablet by mouth two times daily (200 mg total)  Unspecified mood (affective) disorder (HCC) -     lamoTRIgine  (LAMICTAL ) 100 MG tablet; Take one tablet by mouth two times daily (200 mg total)     Please see After Visit Summary for patient specific instructions.  Future Appointments  Date Time Provider Department Center  11/05/2023  8:20 AM Bennet Brasil, MD [facility]  03/19/2024  3:15 PM Johny Nap, NP [facility] None    No orders of the defined types were placed in this encounter.   -------------------------------

## 2023-08-26 ENCOUNTER — Ambulatory Visit: Payer: 59 | Admitting: Family Medicine

## 2023-09-15 ENCOUNTER — Other Ambulatory Visit: Payer: Self-pay | Admitting: Family Medicine

## 2023-10-30 ENCOUNTER — Other Ambulatory Visit: Payer: Self-pay | Admitting: Medical Genetics

## 2023-11-01 ENCOUNTER — Other Ambulatory Visit (HOSPITAL_COMMUNITY)

## 2023-11-04 DIAGNOSIS — Z79899 Other long term (current) drug therapy: Secondary | ICD-10-CM | POA: Diagnosis not present

## 2023-11-04 DIAGNOSIS — E1169 Type 2 diabetes mellitus with other specified complication: Secondary | ICD-10-CM | POA: Diagnosis not present

## 2023-11-04 DIAGNOSIS — E785 Hyperlipidemia, unspecified: Secondary | ICD-10-CM | POA: Diagnosis not present

## 2023-11-04 DIAGNOSIS — E119 Type 2 diabetes mellitus without complications: Secondary | ICD-10-CM | POA: Diagnosis not present

## 2023-11-05 ENCOUNTER — Encounter: Payer: Self-pay | Admitting: Family Medicine

## 2023-11-05 ENCOUNTER — Ambulatory Visit: Payer: Self-pay | Admitting: Family Medicine

## 2023-11-05 ENCOUNTER — Ambulatory Visit: Payer: 59 | Admitting: Family Medicine

## 2023-11-05 VITALS — BP 138/86 | HR 79 | Temp 97.9°F | Ht 71.0 in | Wt 219.0 lb

## 2023-11-05 DIAGNOSIS — Z7985 Long-term (current) use of injectable non-insulin antidiabetic drugs: Secondary | ICD-10-CM | POA: Diagnosis not present

## 2023-11-05 DIAGNOSIS — Z23 Encounter for immunization: Secondary | ICD-10-CM

## 2023-11-05 DIAGNOSIS — E119 Type 2 diabetes mellitus without complications: Secondary | ICD-10-CM

## 2023-11-05 DIAGNOSIS — E785 Hyperlipidemia, unspecified: Secondary | ICD-10-CM

## 2023-11-05 DIAGNOSIS — E1169 Type 2 diabetes mellitus with other specified complication: Secondary | ICD-10-CM | POA: Diagnosis not present

## 2023-11-05 DIAGNOSIS — I10 Essential (primary) hypertension: Secondary | ICD-10-CM | POA: Diagnosis not present

## 2023-11-05 DIAGNOSIS — Z79899 Other long term (current) drug therapy: Secondary | ICD-10-CM

## 2023-11-05 LAB — LIPID PANEL
Chol/HDL Ratio: 4.4 ratio (ref 0.0–5.0)
Cholesterol, Total: 170 mg/dL (ref 100–199)
HDL: 39 mg/dL — ABNORMAL LOW (ref >39)
LDL Chol Calc (NIH): 51 mg/dL (ref 0–99)
Triglycerides: 544 mg/dL — ABNORMAL HIGH (ref 0–149)
VLDL Cholesterol Cal: 80 mg/dL — ABNORMAL HIGH (ref 5–40)

## 2023-11-05 LAB — BASIC METABOLIC PANEL WITH GFR
BUN/Creatinine Ratio: 10 (ref 9–20)
BUN: 9 mg/dL (ref 6–24)
CO2: 22 mmol/L (ref 20–29)
Calcium: 9.1 mg/dL (ref 8.7–10.2)
Chloride: 102 mmol/L (ref 96–106)
Creatinine, Ser: 0.93 mg/dL (ref 0.76–1.27)
Glucose: 87 mg/dL (ref 70–99)
Potassium: 4.7 mmol/L (ref 3.5–5.2)
Sodium: 140 mmol/L (ref 134–144)
eGFR: 106 mL/min/1.73 (ref >59)

## 2023-11-05 LAB — HEMOGLOBIN A1C
Est. average glucose Bld gHb Est-mCnc: 100 mg/dL
Hgb A1c MFr Bld: 5.1 % (ref 4.8–5.6)

## 2023-11-05 LAB — HEPATIC FUNCTION PANEL
ALT: 29 IU/L (ref 0–44)
AST: 22 IU/L (ref 0–40)
Albumin: 4.3 g/dL (ref 4.1–5.1)
Alkaline Phosphatase: 65 IU/L (ref 44–121)
Bilirubin Total: 0.5 mg/dL (ref 0.0–1.2)
Bilirubin, Direct: 0.16 mg/dL (ref 0.00–0.40)
Total Protein: 6.5 g/dL (ref 6.0–8.5)

## 2023-11-05 MED ORDER — METFORMIN HCL 500 MG PO TABS: ORAL_TABLET | ORAL | 1 refills | Status: DC

## 2023-11-05 MED ORDER — ICOSAPENT ETHYL 1 G PO CAPS: 1.0000 g | ORAL_CAPSULE | Freq: Two times a day (BID) | ORAL | 1 refills | Status: DC

## 2023-11-05 MED ORDER — LISINOPRIL 20 MG PO TABS: 20.0000 mg | ORAL_TABLET | Freq: Every day | ORAL | 1 refills | Status: DC

## 2023-11-05 MED ORDER — TIRZEPATIDE 7.5 MG/0.5ML ~~LOC~~ SOAJ: 7.5000 mg | SUBCUTANEOUS | 5 refills | Status: DC

## 2023-11-05 NOTE — Progress Notes (Signed)
 Subjective:    Patient ID: Blake [name], male    DOB: 1980-04-24, 43 y.o.   MRN: [id number]  HPI follow up  Hypertension-patient taking his medication try and do the best he can staying active with yard work.  Has done a very good job of staying away from smoking.  In regards to alcohol only has a few drinks on the weekends.  Denies misuse.  Tries to eat healthy  Hyperlipidemia-recently thinks he reduced his Vascepa  to just 1/day recent lab work shows triglycerides moderately elevated.  LDL looks good does take his rosuvastatin  on a regular basis  DM2-tolerating Mounjaro  doing very well with this A1c looks very good does do the metformin  twice a day does have some GI side effects from Mounjaro  and metformin  but not severe  Begin regular yard work not doing any regular walking program currently stays busy with family stays busy with work stress levels reasonable    Review of Systems     Objective:   Physical Exam General-in no acute distress Eyes-no discharge Lungs-respiratory rate normal, CTA CV-no murmurs,RRR Extremities skin warm dry no edema Neuro grossly normal Behavior normal, alert   Results for orders placed or performed in visit on 05/27/23  Hemoglobin A1c   Collection Time: 11/04/23  8:10 AM  Result Value Ref Range   Hgb A1c MFr Bld 5.1 4.8 - 5.6 %   Est. average glucose Bld gHb Est-mCnc 100 mg/dL  Basic Metabolic Panel   Collection Time: 11/04/23  8:10 AM  Result Value Ref Range   Glucose 87 70 - 99 mg/dL   BUN 9 6 - 24 mg/dL   Creatinine, Ser 9.06 0.76 - 1.27 mg/dL   eGFR 893 >40 fO/fpw/8.26   BUN/Creatinine Ratio 10 9 - 20   Sodium 140 134 - 144 mmol/L   Potassium 4.7 3.5 - 5.2 mmol/L   Chloride 102 96 - 106 mmol/L   CO2 22 20 - 29 mmol/L   Calcium  9.1 8.7 - 10.2 mg/dL  Lipid Panel   Collection Time: 11/04/23  8:10 AM  Result Value Ref Range   Cholesterol, Total 170 100 - 199 mg/dL   Triglycerides 455 (H) 0 - 149 mg/dL   HDL 39 (L) >60 mg/dL    VLDL Cholesterol Cal 80 (H) 5 - 40 mg/dL   LDL Chol Calc (NIH) 51 0 - 99 mg/dL   Chol/HDL Ratio 4.4 0.0 - 5.0 ratio  Hepatic Function Panel   Collection Time: 11/04/23  8:10 AM  Result Value Ref Range   Total Protein 6.5 6.0 - 8.5 g/dL   Albumin 4.3 4.1 - 5.1 g/dL   Bilirubin Total 0.5 0.0 - 1.2 mg/dL   Bilirubin, Direct 9.83 0.00 - 0.40 mg/dL   Alkaline Phosphatase 65 44 - 121 IU/L   AST 22 0 - 40 IU/L   ALT 29 0 - 44 IU/L   Labs were reviewed in detail Triglycerides higher than what we would like He will resume the Vascepa  twice daily LDL in good range Liver enzymes normal A1c much better Kidney functions are looking good     Assessment & Plan:   1. Type 2 diabetes mellitus without complication, without long-term current use of insulin  (HCC) The patient was seen today as part of a comprehensive visit for diabetes. The importance of keeping her A1c at or below 7 range was discussed.  Discussed diet, activity, and medication compliance Emphasized healthy eating primarily with vegetables fruits and if utilizing meats lean meats such  as chicken or fish grilled baked broiled Avoid sugary drinks Minimize and avoid processed foods Fit in regular physical activity preferably 25 to 30 minutes 4 times per week Standard follow-up visit recommended.  Patient aware lack of control and follow-up increases risk of diabetic complications. Regular follow-up visits Yearly ophthalmology Yearly foot exam  - tirzepatide  (MOUNJARO ) 7.5 MG/0.5ML Pen; Inject 7.5 mg into the skin once a week.  Dispense: 2 mL; Refill: 5  2. Essential hypertension (Primary) HTN- patient seen for follow-up regarding HTN.   Diet, medication compliance, appropriate labs and refills were completed.   Importance of keeping blood pressure under good control to lessen the risk of complications discussed Regular follow-up visits discussed  Patient will check blood pressure a few times at home send us  some readings  ideally we would like to get his blood pressure 130/80 Patient will try to fit in regular walking for exercise  3. Hyperlipidemia associated with type 2 diabetes mellitus (HCC) Resume Vascepa  twice daily continue rosuvastatin  daily healthy diet  Pneumonia vaccine today Flu vaccine in the fall

## 2023-11-29 ENCOUNTER — Other Ambulatory Visit: Payer: Self-pay | Admitting: Family Medicine

## 2023-11-29 DIAGNOSIS — E119 Type 2 diabetes mellitus without complications: Secondary | ICD-10-CM

## 2024-01-14 ENCOUNTER — Other Ambulatory Visit: Payer: Self-pay | Admitting: Behavioral Health

## 2024-01-14 DIAGNOSIS — F39 Unspecified mood [affective] disorder: Secondary | ICD-10-CM

## 2024-01-14 DIAGNOSIS — R454 Irritability and anger: Secondary | ICD-10-CM

## 2024-01-15 ENCOUNTER — Telehealth (INDEPENDENT_AMBULATORY_CARE_PROVIDER_SITE_OTHER): Admitting: Behavioral Health

## 2024-01-15 ENCOUNTER — Encounter: Payer: Self-pay | Admitting: Behavioral Health

## 2024-01-15 DIAGNOSIS — R454 Irritability and anger: Secondary | ICD-10-CM

## 2024-01-15 DIAGNOSIS — F39 Unspecified mood [affective] disorder: Secondary | ICD-10-CM | POA: Diagnosis not present

## 2024-01-15 MED ORDER — LAMOTRIGINE 100 MG PO TABS: ORAL_TABLET | ORAL | 1 refills | Status: AC

## 2024-01-15 NOTE — Progress Notes (Signed)
 Blake [name] [id number] Sep 05, 1980 43 y.o.  Virtual Visit via Video Note  I connected with pt @ on 01/15/24 at 11:30 AM EDT by a video enabled telemedicine application and verified that I am speaking with the correct person using two identifiers.   I discussed the limitations of evaluation and management by telemedicine and the availability of in person appointments. The patient expressed understanding and agreed to proceed.  I discussed the assessment and treatment plan with the patient. The patient was provided an opportunity to ask questions and all were answered. The patient agreed with the plan and demonstrated an understanding of the instructions.   The patient was advised to call back or seek an in-person evaluation if the symptoms worsen or if the condition fails to improve as anticipated.  I provided 30 minutes of non-face-to-face time during this encounter.  The patient was located at home.  The provider was located at [facility].   Blake DELENA Pizza, NP   Subjective:   Patient ID:  Blake [name] is a 43 y.o. (DOB 04-09-1981) male.  Chief Complaint:  Chief Complaint  Patient presents with   Anxiety   Depression   Follow-up   Medication Refill   Patient Education    HPI 43  year old male presents to this office via video visit for follow up and medication management. No psychosocial changes this visit.  Continues to do well with the Lamictal . Medication is working well. Requesting no dosage changes or adjustments this visit.  He is currently on Mounjaro  for weight loss. Appetite is good.  He is requesting 6  month f/u.  His anxiety today  is 2/10 and depression is 0/10. He sleeps about 7 hours per night now which is also new improvement. He does endorse sleep apnea and wears C-Pap at while sleeping. He denies mania, no psychosis, no SI/HI.   Prior psychiatric medication list:   Wellbutrin Zoloft  Prozac  Chantix                Review of  Systems:  Review of Systems  Constitutional: Negative.   Allergic/Immunologic: Negative.   Neurological: Negative.   Psychiatric/Behavioral: Negative.      Medications: I have reviewed the patient's current medications.  Current Outpatient Medications  Medication Sig Dispense Refill   glucose blood test strip Test glucose once a day DX: E11.9 50 each 5   icosapent  Ethyl (VASCEPA ) 1 g capsule Take 1 capsule (1 g total) by mouth 2 (two) times daily. 180 capsule 1   lamoTRIgine  (LAMICTAL ) 100 MG tablet Take one tablet by mouth two times daily (200 mg total) 180 tablet 1   lisinopril  (ZESTRIL ) 20 MG tablet Take 1 tablet (20 mg total) by mouth daily. 90 tablet 1   metFORMIN  (GLUCOPHAGE ) 500 MG tablet 1 qd 90 tablet 1   MOUNJARO  7.5 MG/0.5ML Pen ADMINISTER 7.5 MG UNDER THE SKIN 1 TIME A WEEK 2 mL 5   rosuvastatin  (CRESTOR ) 40 MG tablet TAKE 1 TABLET BY MOUTH     DAILY 90 tablet 3   No current facility-administered medications for this visit.    Medication Side Effects: None  Allergies:  Allergies  Allergen Reactions   Penicillins Other (See Comments)    Unknown childhood reaction    Past Medical History:  Diagnosis Date   Depression    Elevated ferritin 06/25/2019   High blood cholesterol level    Hypertension    Sleep apnea 01/07/2013   cpap   TIA (transient ischemic attack)  Tobacco abuse 11/19/2018   Patient working very hard at quitting   Type 2 diabetes mellitus (HCC) 11/19/2018    Family History  Problem Relation Age of Onset   Schizophrenia Father    Alcohol abuse Father    Diabetes Father    Alcohol abuse Maternal Grandfather    Alcohol abuse Paternal Uncle    Sleep apnea Neg Hx     Social History   Socioeconomic History   Marital status: Married    Spouse name: Not on file   Number of children: Not on file   Years of education: Not on file   Highest education level: Associate degree: occupational, Scientist, product/process development, or vocational program  Occupational  History   Not on file  Tobacco Use   Smoking status: Former    Current packs/day: 0.00    Average packs/day: 0.5 packs/day for 15.0 years (7.5 ttl pk-yrs)    Types: Cigarettes    Start date: 09/08/2006    Quit date: 09/07/2021    Years since quitting: 2.3   Smokeless tobacco: Never   Tobacco comments:    Started smoking again around April 2020  Vaping Use   Vaping status: Former  Substance and Sexual Activity   Alcohol use: Yes    Alcohol/week: 0.0 standard drinks of alcohol    Comment: occas   Drug use: No   Sexual activity: Yes  Other Topics Concern   Not on file  Social History Narrative   Live with wife and two children in [place]. Like woodworking in free time.      Right handed   Caffeine: 1 cup/day   Social Drivers of Corporate investment banker Strain: Low Risk  (11/04/2023)   Overall Financial Resource Strain (CARDIA)    Difficulty of Paying Living Expenses: Not hard at all  Food Insecurity: No Food Insecurity (11/04/2023)   Hunger Vital Sign    Worried About Running Out of Food in the Last Year: Never true    Ran Out of Food in the Last Year: Never true  Transportation Needs: No Transportation Needs (11/04/2023)   PRAPARE - Administrator, Civil Service (Medical): No    Lack of Transportation (Non-Medical): No  Physical Activity: Sufficiently Active (11/04/2023)   Exercise Vital Sign    Days of Exercise per Week: 3 days    Minutes of Exercise per Session: 100 min  Stress: No Stress Concern Present (11/04/2023)   [employer] of Occupational Health - Occupational Stress Questionnaire    Feeling of Stress: Only a little  Social Connections: Socially Integrated (11/04/2023)   Social Connection and Isolation Panel    Frequency of Communication with Friends and Family: Three times a week    Frequency of Social Gatherings with Friends and Family: Twice a week    Attends Religious Services: More than 4 times per year    Active Member of [employer] or  Organizations: Yes    Attends Banker Meetings: 1 to 4 times per year    Marital Status: Married  Catering manager Violence: Unknown (07/14/2021)   Received from [facility]   HITS    Physically Hurt: Not on file    Insult or Talk Down To: Not on file    Threaten Physical Harm: Not on file    Scream or Curse: Not on file    Past Medical History, Surgical history, Social history, and Family history were reviewed and updated as appropriate.   Please see review of systems  for further details on the patient's review from today.   Objective:   Physical Exam:  There were no vitals taken for this visit.  Physical Exam  Lab Review:     Component Value Date/Time   NA 140 11/04/2023 0810   K 4.7 11/04/2023 0810   CL 102 11/04/2023 0810   CO2 22 11/04/2023 0810   GLUCOSE 87 11/04/2023 0810   GLUCOSE 116 (H) 09/26/2021 0456   BUN 9 11/04/2023 0810   CREATININE 0.93 11/04/2023 0810   CREATININE 1.08 11/23/2013 0847   CALCIUM  9.1 11/04/2023 0810   PROT 6.5 11/04/2023 0810   ALBUMIN 4.3 11/04/2023 0810   AST 22 11/04/2023 0810   ALT 29 11/04/2023 0810   ALKPHOS 65 11/04/2023 0810   BILITOT 0.5 11/04/2023 0810   GFRNONAA >60 09/26/2021 0456   GFRAA 126 01/12/2020 0803       Component Value Date/Time   WBC 7.3 05/13/2023 0748   WBC 6.8 09/26/2021 0456   RBC 5.27 05/13/2023 0748   RBC 4.94 09/26/2021 0456   HGB 16.7 05/13/2023 0748   HCT 48.6 05/13/2023 0748   PLT 236 05/13/2023 0748   MCV 92 05/13/2023 0748   MCH 31.7 05/13/2023 0748   MCH 31.0 09/26/2021 0456   MCHC 34.4 05/13/2023 0748   MCHC 34.6 09/26/2021 0456   RDW 12.9 05/13/2023 0748   LYMPHSABS 1.8 05/13/2023 0748   MONOABS 0.6 08/01/2020 0931   EOSABS 0.6 (H) 05/13/2023 0748   BASOSABS 0.0 05/13/2023 0748    No results found for: POCLITH, LITHIUM   No results found for: PHENYTOIN, PHENOBARB, VALPROATE, CBMZ   .res Assessment: Plan:    Greater than 50% of 30 min via video  visit with patient was spent on counseling and coordination of care. Talked about his continued stability with irritability and moods.  He has had no undesirable side effects and has been feeling well. He will continue with the higher dose.  No med changes indicated this visit.    Continue Lamictal  200 mg daily divided between two doses.  Will report worsening symptoms promptly To follow up in 6 months to  reassess per pt Provided emergency contact information Monitor for any sign of rash. Please taking Lamictal  and contact office immediately rash develops. Recommend seeking urgent medical attention if rash is severe and/or spreading quickly.  Reviewed PDMP        Morey was seen today for anxiety, depression, follow-up, medication refill and patient education.  Diagnoses and all orders for this visit:  Irritability -     lamoTRIgine  (LAMICTAL ) 100 MG tablet; Take one tablet by mouth two times daily (200 mg total)  Unspecified mood (affective) disorder -     lamoTRIgine  (LAMICTAL ) 100 MG tablet; Take one tablet by mouth two times daily (200 mg total)     Please see After Visit Summary for patient specific instructions.  Future Appointments  Date Time Provider Department Center  02/20/2024  2:30 PM Alphonsa Glendia LABOR, MD [facility] [address]  03/19/2024  3:15 PM Whitfield Raisin, NP [facility] None  05/05/2024  8:40 AM Alphonsa Glendia LABOR, MD [facility] [place]    No orders of the defined types were placed in this encounter.     -------------------------------

## 2024-01-18 ENCOUNTER — Other Ambulatory Visit: Payer: Self-pay | Admitting: Medical Genetics

## 2024-01-18 DIAGNOSIS — Z006 Encounter for examination for normal comparison and control in clinical research program: Secondary | ICD-10-CM

## 2024-01-21 DIAGNOSIS — Z23 Encounter for immunization: Secondary | ICD-10-CM | POA: Diagnosis not present

## 2024-02-12 ENCOUNTER — Encounter: Payer: Self-pay | Admitting: Family Medicine

## 2024-02-12 ENCOUNTER — Telehealth: Payer: Self-pay | Admitting: Family Medicine

## 2024-02-12 ENCOUNTER — Other Ambulatory Visit: Payer: Self-pay | Admitting: Family Medicine

## 2024-02-12 NOTE — Telephone Encounter (Signed)
 Blake [name] Would you please send him a starter pack of Chantix  for some reason I am having a hard time doing so through my electronics you may have to do a prescription and have the nurses fax it-he is taking it before  (I am at home this evening and will not be back to the office till Monday evening or Tuesday-going to [place]  to see Ronal hopefully if airlines do not cancel) thank you for helping

## 2024-02-13 ENCOUNTER — Other Ambulatory Visit: Payer: Self-pay | Admitting: Nurse Practitioner

## 2024-02-13 MED ORDER — VARENICLINE TARTRATE 0.5 MG PO TABS: ORAL_TABLET | ORAL | 0 refills | Status: AC

## 2024-02-20 ENCOUNTER — Ambulatory Visit: Admitting: Family Medicine

## 2024-02-20 ENCOUNTER — Encounter: Payer: Self-pay | Admitting: Family Medicine

## 2024-02-20 VITALS — BP 148/88 | HR 88 | Temp 98.2°F | Ht 71.0 in | Wt 223.2 lb

## 2024-02-20 DIAGNOSIS — I10 Essential (primary) hypertension: Secondary | ICD-10-CM

## 2024-02-20 DIAGNOSIS — Z0001 Encounter for general adult medical examination with abnormal findings: Secondary | ICD-10-CM

## 2024-02-20 DIAGNOSIS — Z0000 Encounter for general adult medical examination without abnormal findings: Secondary | ICD-10-CM

## 2024-02-20 DIAGNOSIS — Z79899 Other long term (current) drug therapy: Secondary | ICD-10-CM | POA: Diagnosis not present

## 2024-02-20 DIAGNOSIS — E785 Hyperlipidemia, unspecified: Secondary | ICD-10-CM

## 2024-02-20 DIAGNOSIS — E1169 Type 2 diabetes mellitus with other specified complication: Secondary | ICD-10-CM

## 2024-02-20 DIAGNOSIS — E66811 Obesity, class 1: Secondary | ICD-10-CM

## 2024-02-20 DIAGNOSIS — E119 Type 2 diabetes mellitus without complications: Secondary | ICD-10-CM

## 2024-02-20 DIAGNOSIS — F1090 Alcohol use, unspecified, uncomplicated: Secondary | ICD-10-CM

## 2024-02-20 DIAGNOSIS — Z720 Tobacco use: Secondary | ICD-10-CM

## 2024-02-20 MED ORDER — LISINOPRIL 40 MG PO TABS: 40.0000 mg | ORAL_TABLET | Freq: Every day | ORAL | 1 refills | Status: AC

## 2024-02-20 MED ORDER — METFORMIN HCL 500 MG PO TABS: ORAL_TABLET | ORAL | 1 refills | Status: DC

## 2024-02-20 MED ORDER — ICOSAPENT ETHYL 1 G PO CAPS
1.0000 g | ORAL_CAPSULE | Freq: Two times a day (BID) | ORAL | 1 refills | Status: AC
Start: 2024-02-20 — End: ?

## 2024-02-20 NOTE — Progress Notes (Signed)
   Subjective:    Patient ID: Blake [name], male    DOB: 04-17-1980, 43 y.o.   MRN: [id number] Here for CPE.  HPI The patient comes in today for a wellness visit.    A review of their health history was completed.  A review of medications was also completed.  Any needed refills; will update refills as needed  Eating habits: Recently has not been eating healthy a lot of snack foods but now he states he is gena work hard at [employer] regular physical activity  Falls/  MVA accidents in past few months: Denies any accidents or falls recently  Regular exercise: He is planning on fitting into walking on a more regular basis  Specialist pt sees on regular basis: None currently  Preventative health issues were discussed.   Additional concerns: Under a lot of stress Trying to quit smoking and staying away from drinking cutting way back to only couple drinks per week Patient under a lot of stress with some financial issues Not suicidal States he is going to be making some changes to try to get things doing better  The patient was seen today as part of a comprehensive diabetic check up. Patient has diabetes Patient relates good compliance with taking the medication. We discussed their diet and exercise activities  We also discussed the importance of notifying us  if any excessively high glucoses or low sugars.    Patient here for follow-up regarding cholesterol.    Patient relates taking medication on a regular basis Denies problems with medication Importance of dietary measures discussed Regular lab work regarding lipid and liver was checked and if needing additional labs was appropriately ordered  Patient for blood pressure check up.  The patient does have hypertension.   Patient relates dietary measures try to minimize salt The importance of healthy diet and activity were discussed Patient relates compliance    Review of Systems     Objective:   Physical  Exam  General-in no acute distress Eyes-no discharge Lungs-respiratory rate normal, CTA CV-no murmurs,RRR Extremities skin warm dry no edema Neuro grossly normal Behavior normal, alert       Assessment & Plan:  1. Well adult exam (Primary) Adult wellness-complete.wellness physical was conducted today. Importance of diet and exercise were discussed in detail.  Importance of stress reduction and healthy living were discussed.  In addition to this a discussion regarding safety was also covered.  We also reviewed over immunizations and gave recommendations regarding current immunization needed for age.   In addition to this additional areas were also touched on including: Preventative health exams needed:  Colonoscopy not indicated currently  Patient was advised yearly wellness exam   2. Obesity (BMI 30.0-34.9) Portion control regular physical activity  3. High risk medication use None  4. Essential hypertension Blood pressure mildly elevated patient will check blood pressure at home bump up dose of lisinopril  if his numbers start doing better we can always reduce the dose  5. Hyperlipidemia associated with type 2 diabetes mellitus (HCC) Lab work recommended healthy diet check lab work by late December  6. Type 2 diabetes mellitus without complication, without long-term current use of insulin  (HCC) Lab work by late December healthy diet  7. Tobacco abuse Continue Chantix  keep us  posted if any changes or problems  8. Alcohol use Patient cutting back does not feel he needs any medicine to help  Initiate prostate cancer screening age 48 recommend colonoscopy age 51

## 2024-03-19 ENCOUNTER — Encounter: Payer: Self-pay | Admitting: Adult Health

## 2024-03-19 ENCOUNTER — Telehealth: Payer: 59 | Admitting: Adult Health

## 2024-03-19 DIAGNOSIS — G4733 Obstructive sleep apnea (adult) (pediatric): Secondary | ICD-10-CM | POA: Diagnosis not present

## 2024-03-19 NOTE — Progress Notes (Signed)
 [facility] [address] [place]. [place] [address] [phone]       OFFICE FOLLOW UP NOTE  Mr. Blake [name] Date of Birth:  02-13-1981 Medical Record Number:  [id number]     Primary neurologist: Dr. Buck Reason for visit: CPAP follow-up  Virtual Visit via Video Note  I connected with Blake [name] on 03/19/2024 at  3:15 PM EST by a video enabled telemedicine application and verified that I am speaking with the correct person using two identifiers.  Location: Patient: in [place] Provider: in office, [facility]   I discussed the limitations of evaluation and management by telemedicine and the availability of in person appointments. The patient expressed understanding and agreed to proceed.     SUBJECTIVE:   Follow-up visit:  Prior visit: 03/18/2023  Brief HPI:   Blake [name] is a 43 y.o. male who is followed in office for OSA on CPAP.  HST 07/2021 showed AHI of 6.5/h and O2 nadir of 85%.  AutoPap set up 08/16/2021.  At prior visit, noted good compliance and optimal residual AHI.  Reported tolerating well.  Continued current pressure settings.   Interval history:  Patient returns for yearly CPAP compliance visit via MyChart video visit.  He continues to do well with CPAP therapy.  Notes continued benefit with CPAP use.  He is in need of updated supplies, plans on contacting DME again to follow up on this. He has no questions or concerns at this time.        ROS:   14 system review of systems performed and negative with exception of those listed in HPI  PMH:  Past Medical History:  Diagnosis Date   Depression    Elevated ferritin 06/25/2019   High blood cholesterol level    Hypertension    Sleep apnea 01/07/2013   cpap   TIA (transient ischemic attack)    Tobacco abuse 11/19/2018   Patient working very hard at quitting   Type 2 diabetes mellitus (HCC) 11/19/2018    PSH:  Past Surgical History:  Procedure Laterality Date    ABDOMINAL SURGERY     APPENDECTOMY     CHOLECYSTECTOMY      Social History:  Social History   Socioeconomic History   Marital status: Married    Spouse name: Not on file   Number of children: Not on file   Years of education: Not on file   Highest education level: Associate degree: occupational, scientist, product/process development, or vocational program  Occupational History   Not on file  Tobacco Use   Smoking status: Every Day    Current packs/day: 0.00    Average packs/day: 0.5 packs/day for 15.0 years (7.5 ttl pk-yrs)    Types: Cigarettes    Start date: 09/08/2006    Last attempt to quit: 09/07/2021    Years since quitting: 2.5   Smokeless tobacco: Never   Tobacco comments:    Started smoking again around April 2020  Vaping Use   Vaping status: Former  Substance and Sexual Activity   Alcohol use: Yes    Alcohol/week: 8.0 standard drinks of alcohol    Types: 8 Shots of liquor per week    Comment: occas   Drug use: No   Sexual activity: Yes  Other Topics Concern   Not on file  Social History Narrative   Live with wife and two children in [place]. Like woodworking in free time.      Right handed   Caffeine: 1 cup/day  Social Drivers of Health   Tobacco Use: High Risk (02/20/2024)   Patient History    Smoking Tobacco Use: Every Day    Smokeless Tobacco Use: Never    Passive Exposure: Not on file  Financial Resource Strain: Low Risk (02/19/2024)   Overall Financial Resource Strain (CARDIA)    Difficulty of Paying Living Expenses: Not hard at all  Food Insecurity: No Food Insecurity (02/19/2024)   Epic    Worried About Programme Researcher, Broadcasting/film/video in the Last Year: Never true    Ran Out of Food in the Last Year: Never true  Transportation Needs: No Transportation Needs (02/19/2024)   Epic    Lack of Transportation (Medical): No    Lack of Transportation (Non-Medical): No  Physical Activity: Insufficiently Active (02/19/2024)   Exercise Vital Sign    Days of Exercise per Week: 2 days     Minutes of Exercise per Session: 60 min  Stress: No Stress Concern Present (02/19/2024)   [employer] of Occupational Health - Occupational Stress Questionnaire    Feeling of Stress: Not at all  Social Connections: Socially Integrated (02/19/2024)   Social Connection and Isolation Panel    Frequency of Communication with Friends and Family: More than three times a week    Frequency of Social Gatherings with Friends and Family: Twice a week    Attends Religious Services: More than 4 times per year    Active Member of [employer] or Organizations: Yes    Attends Banker Meetings: More than 4 times per year    Marital Status: Married  Catering Manager Violence: Unknown (07/14/2021)   Received from [facility]   HITS    Physically Hurt: Not on file    Insult or Talk Down To: Not on file    Threaten Physical Harm: Not on file    Scream or Curse: Not on file  Depression (PHQ2-9): High Risk (02/20/2024)   Depression (PHQ2-9)    PHQ-2 Score: 12  Alcohol Screen: Medium Risk (02/19/2024)   Alcohol Screen    Last Alcohol Screening Score (AUDIT): 12  Housing: Unknown (02/19/2024)   Epic    Unable to Pay for Housing in the Last Year: No    Number of Times Moved in the Last Year: Not on file    Homeless in the Last Year: No  Utilities: Not on file  Health Literacy: Not on file    Family History:  Family History  Problem Relation Age of Onset   Schizophrenia Father    Alcohol abuse Father    Diabetes Father    Alcohol abuse Maternal Grandfather    Alcohol abuse Paternal Uncle    Sleep apnea Neg Hx     Medications:   Current Outpatient Medications on File Prior to Visit  Medication Sig Dispense Refill   glucose blood test strip Test glucose once a day DX: E11.9 50 each 5   icosapent  Ethyl (VASCEPA ) 1 g capsule Take 1 capsule (1 g total) by mouth 2 (two) times daily. 180 capsule 1   lamoTRIgine  (LAMICTAL ) 100 MG tablet Take one tablet by mouth two times daily (200 mg  total) 180 tablet 1   lisinopril  (ZESTRIL ) 40 MG tablet Take 1 tablet (40 mg total) by mouth daily. 90 tablet 1   metFORMIN  (GLUCOPHAGE ) 500 MG tablet 1 qd 90 tablet 1   MOUNJARO  7.5 MG/0.5ML Pen ADMINISTER 7.5 MG UNDER THE SKIN 1 TIME A WEEK 2 mL 5   rosuvastatin  (CRESTOR ) 40 MG  tablet TAKE 1 TABLET BY MOUTH     DAILY 90 tablet 3   varenicline  (CHANTIX ) 0.5 MG tablet Take one tab (0.5 mg) po BID x 2 weeks then take 2 tabs (1.0 mg total) po BID for smoking cessation 84 tablet 0   No current facility-administered medications on file prior to visit.    Allergies:   Allergies  Allergen Reactions   Penicillins Other (See Comments)    Unknown childhood reaction      OBJECTIVE:  Physical Exam  General: well developed, well nourished, seated, in no evident distress  Neurologic Exam Mental Status: Awake and fully alert. Oriented to place and time. Recent and remote memory intact. Attention span, concentration and fund of knowledge appropriate. Mood and affect appropriate.        ASSESSMENT/PLAN: KAYMON DENOMME is a 43 y.o. year old male    OSA on CPAP :  Compliance report shows satisfactory usage with optimal residual AHI.   Continue current pressure settings of 6-12 with EPR 3.    Discussed continued nightly usage with ensuring greater than 4 hours nightly for optimal benefit and per insurance purposes.   Continue to follow with DME company [facility] for any needed supplies or CPAP related concerns CPAP set up 08/2021      Follow up in 1 year via MyChart VV or call earlier if needed   CC:  PCP: Alphonsa Glendia LABOR, MD     Blake [name], AGNP-BC  [facility] [address] [place], [place] [address]  Phone [phone] Fax [phone] Note: This document was prepared with digital dictation and possible smart phrase technology. Any transcriptional errors that result from this process are unintentional.

## 2024-05-05 ENCOUNTER — Ambulatory Visit: Admitting: Family Medicine

## 2024-05-08 ENCOUNTER — Other Ambulatory Visit: Payer: Self-pay | Admitting: Family Medicine

## 2024-05-13 ENCOUNTER — Ambulatory Visit: Payer: Self-pay | Admitting: Family Medicine

## 2024-05-13 LAB — BASIC METABOLIC PANEL WITH GFR
BUN/Creatinine Ratio: 9 (ref 9–20)
BUN: 9 mg/dL (ref 6–24)
CO2: 23 mmol/L (ref 20–29)
Calcium: 9.5 mg/dL (ref 8.7–10.2)
Chloride: 104 mmol/L (ref 96–106)
Creatinine, Ser: 1.01 mg/dL (ref 0.76–1.27)
Glucose: 81 mg/dL (ref 70–99)
Potassium: 4.7 mmol/L (ref 3.5–5.2)
Sodium: 143 mmol/L (ref 134–144)
eGFR: 96 mL/min/{1.73_m2}

## 2024-05-13 LAB — HEPATIC FUNCTION PANEL
ALT: 31 [IU]/L (ref 0–44)
AST: 25 [IU]/L (ref 0–40)
Albumin: 4.7 g/dL (ref 4.1–5.1)
Alkaline Phosphatase: 63 [IU]/L (ref 47–123)
Bilirubin Total: 0.5 mg/dL (ref 0.0–1.2)
Bilirubin, Direct: 0.17 mg/dL (ref 0.00–0.40)
Total Protein: 7.1 g/dL (ref 6.0–8.5)

## 2024-05-13 LAB — LIPID PANEL
Chol/HDL Ratio: 2.9 ratio (ref 0.0–5.0)
Cholesterol, Total: 135 mg/dL (ref 100–199)
HDL: 46 mg/dL
LDL Chol Calc (NIH): 69 mg/dL (ref 0–99)
Triglycerides: 112 mg/dL (ref 0–149)
VLDL Cholesterol Cal: 20 mg/dL (ref 5–40)

## 2024-05-13 LAB — MICROALBUMIN / CREATININE URINE RATIO
Creatinine, Urine: 292.6 mg/dL
Microalb/Creat Ratio: 10 mg/g{creat} (ref 0–29)
Microalbumin, Urine: 27.8 ug/mL

## 2024-05-13 LAB — HEMOGLOBIN A1C
Est. average glucose Bld gHb Est-mCnc: 108 mg/dL
Hgb A1c MFr Bld: 5.4 % (ref 4.8–5.6)

## 2024-05-14 ENCOUNTER — Ambulatory Visit: Admitting: Family Medicine

## 2024-05-14 VITALS — BP 130/78 | HR 69 | Temp 98.2°F | Ht 71.0 in | Wt 215.8 lb

## 2024-05-14 DIAGNOSIS — H5789 Other specified disorders of eye and adnexa: Secondary | ICD-10-CM

## 2024-05-14 DIAGNOSIS — G5603 Carpal tunnel syndrome, bilateral upper limbs: Secondary | ICD-10-CM

## 2024-05-14 DIAGNOSIS — I10 Essential (primary) hypertension: Secondary | ICD-10-CM

## 2024-05-14 DIAGNOSIS — E1169 Type 2 diabetes mellitus with other specified complication: Secondary | ICD-10-CM

## 2024-05-14 DIAGNOSIS — E119 Type 2 diabetes mellitus without complications: Secondary | ICD-10-CM

## 2024-05-14 MED ORDER — MOUNJARO 7.5 MG/0.5ML ~~LOC~~ SOAJ: 7.5000 mg | SUBCUTANEOUS | 5 refills | Status: AC

## 2024-05-14 MED ORDER — OLOPATADINE HCL 0.2 % OP SOLN: 1.0000 [drp] | Freq: Every evening | OPHTHALMIC | 2 refills | Status: AC | PRN

## 2024-05-14 MED ORDER — AZELASTINE HCL 0.1 % NA SOLN: 2.0000 | Freq: Two times a day (BID) | NASAL | 12 refills | Status: AC

## 2024-05-14 NOTE — Progress Notes (Signed)
 "  Subjective:    Patient ID: Blake [name], male    DOB: 1981/02/18, 44 y.o.   MRN: [id number]  HPI  Room 6  Pt is here for 6 month follow up  The patient was seen today as part of a comprehensive diabetic check up. Patient has diabetes Patient relates good compliance with taking the medication. We discussed their diet and exercise activities  We also discussed the importance of notifying us  if any excessively high glucoses or low sugars.    Patient for blood pressure check up.  The patient does have hypertension.   Patient relates dietary measures try to minimize salt The importance of healthy diet and activity were discussed Patient relates compliance  Patient here for follow-up regarding cholesterol.    Patient relates taking medication on a regular basis Denies problems with medication Importance of dietary measures discussed Regular lab work regarding lipid and liver was checked and if needing additional labs was appropriately ordered  Patient doing a much better job with lifestyle he is quit smoking Patient is avoiding alcohol Lab work looks great labs reviewed with patient  Results for orders placed or performed in visit on 11/05/23  Hemoglobin A1c   Collection Time: 05/12/24  1:55 PM  Result Value Ref Range   Hgb A1c MFr Bld 5.4 4.8 - 5.6 %   Est. average glucose Bld gHb Est-mCnc 108 mg/dL  Lipid panel   Collection Time: 05/12/24  1:55 PM  Result Value Ref Range   Cholesterol, Total 135 100 - 199 mg/dL   Triglycerides 887 0 - 149 mg/dL   HDL 46 >60 mg/dL   VLDL Cholesterol Cal 20 5 - 40 mg/dL   LDL Chol Calc (NIH) 69 0 - 99 mg/dL   Chol/HDL Ratio 2.9 0.0 - 5.0 ratio  Hepatic function panel   Collection Time: 05/12/24  1:55 PM  Result Value Ref Range   Total Protein 7.1 6.0 - 8.5 g/dL   Albumin 4.7 4.1 - 5.1 g/dL   Bilirubin Total 0.5 0.0 - 1.2 mg/dL   Bilirubin, Direct 9.82 0.00 - 0.40 mg/dL   Alkaline Phosphatase 63 47 - 123 IU/L   AST 25 0 - 40  IU/L   ALT 31 0 - 44 IU/L  Basic metabolic panel with GFR   Collection Time: 05/12/24  1:55 PM  Result Value Ref Range   Glucose 81 70 - 99 mg/dL   BUN 9 6 - 24 mg/dL   Creatinine, Ser 8.98 0.76 - 1.27 mg/dL   eGFR 96 >40 fO/fpw/8.26   BUN/Creatinine Ratio 9 9 - 20   Sodium 143 134 - 144 mmol/L   Potassium 4.7 3.5 - 5.2 mmol/L   Chloride 104 96 - 106 mmol/L   CO2 23 20 - 29 mmol/L   Calcium  9.5 8.7 - 10.2 mg/dL  Microalbumin / creatinine urine ratio   Collection Time: 05/12/24  1:55 PM  Result Value Ref Range   Creatinine, Urine 292.6 Not Estab. mg/dL   Microalbumin, Urine 72.1 Not Estab. ug/mL   Microalb/Creat Ratio 10 0 - 29 mg/g creat    Pt states he is having issues with right eye    Pt's right eye is red and puffy he reports it has been off and on for 2-3 months now, when this happens his right eye drains in the right side the nose runs in his right eye gets a little itchy and red but denies any headaches or migraine symptoms  Pt is having worsening  issues with left and right wrists Significant trouble with carpal tunnel symptoms worse on the right than the left with numbness tingling Bothers him more so when he is trying to do driving or working with his hands computer work or sleeping at night Wears a brace intermittently Review of Systems     Objective:   Physical Exam Right eyelid a little bit red corner of the sclera little bit red no conjunctivoinflammation noted ears normal lungs clear heart regular       Assessment & Plan:  1. Type 2 diabetes mellitus without complication, without long-term current use of insulin  [facility]) The patient was seen today as part of a comprehensive visit for diabetes. The importance of keeping her A1c at or below 7 range was discussed.  Discussed diet, activity, and medication compliance Emphasized healthy eating primarily with vegetables fruits and if utilizing meats lean meats such as chicken or fish grilled baked broiled Avoid  sugary drinks Minimize and avoid processed foods Fit in regular physical activity preferably 25 to 30 minutes 4 times per week Standard follow-up visit recommended.  Patient aware lack of control and follow-up increases risk of diabetic complications. Regular follow-up visits Yearly ophthalmology Yearly foot exam Lab work looks great continue current measures - tirzepatide  (MOUNJARO ) 7.5 MG/0.5ML Pen; Inject 7.5 mg into the skin once a week.  Dispense: 2 mL; Refill: 5  2. Bilateral carpal tunnel syndrome (Primary) Referral to [facility] may need nerve conduction studies  3. Eye irritation Recommend eyedrops along with nasal spray if ongoing symptoms consider referral to ophthalmology  4. Essential hypertension Very good control continue current measures  5. Hyperlipidemia associated with type 2 diabetes mellitus (HCC) Very good control continue current measures  Continue healthy habits as well   "

## 2024-05-15 ENCOUNTER — Other Ambulatory Visit: Payer: Self-pay

## 2024-05-15 DIAGNOSIS — G5603 Carpal tunnel syndrome, bilateral upper limbs: Secondary | ICD-10-CM

## 2024-07-07 ENCOUNTER — Ambulatory Visit: Admitting: Family Medicine

## 2024-08-19 ENCOUNTER — Ambulatory Visit: Admitting: Family Medicine

## 2024-11-13 ENCOUNTER — Ambulatory Visit: Admitting: Family Medicine

## 2025-03-25 ENCOUNTER — Telehealth: Admitting: Adult Health
